# Patient Record
Sex: Female | Born: 1950 | Race: White | Hispanic: No | Smoking: Former smoker
Health system: Southern US, Community
[De-identification: ages and names within clinical notes are randomized; demographics above are authoritative.]

## PROBLEM LIST (undated history)

## (undated) DIAGNOSIS — K222 Esophageal obstruction: Secondary | ICD-10-CM

## (undated) DIAGNOSIS — F329 Major depressive disorder, single episode, unspecified: Secondary | ICD-10-CM

## (undated) DIAGNOSIS — K7689 Other specified diseases of liver: Secondary | ICD-10-CM

## (undated) DIAGNOSIS — K5730 Diverticulosis of large intestine without perforation or abscess without bleeding: Secondary | ICD-10-CM

## (undated) DIAGNOSIS — D126 Benign neoplasm of colon, unspecified: Secondary | ICD-10-CM

## (undated) DIAGNOSIS — M810 Age-related osteoporosis without current pathological fracture: Secondary | ICD-10-CM

## (undated) DIAGNOSIS — J329 Chronic sinusitis, unspecified: Secondary | ICD-10-CM

## (undated) DIAGNOSIS — F419 Anxiety disorder, unspecified: Secondary | ICD-10-CM

## (undated) DIAGNOSIS — I4891 Unspecified atrial fibrillation: Secondary | ICD-10-CM

## (undated) DIAGNOSIS — F32A Depression, unspecified: Secondary | ICD-10-CM

## (undated) DIAGNOSIS — T7840XA Allergy, unspecified, initial encounter: Secondary | ICD-10-CM

## (undated) DIAGNOSIS — E119 Type 2 diabetes mellitus without complications: Secondary | ICD-10-CM

## (undated) DIAGNOSIS — M1990 Unspecified osteoarthritis, unspecified site: Secondary | ICD-10-CM

## (undated) DIAGNOSIS — K219 Gastro-esophageal reflux disease without esophagitis: Secondary | ICD-10-CM

## (undated) DIAGNOSIS — M5030 Other cervical disc degeneration, unspecified cervical region: Secondary | ICD-10-CM

## (undated) DIAGNOSIS — C4491 Basal cell carcinoma of skin, unspecified: Secondary | ICD-10-CM

## (undated) HISTORY — DX: Other cervical disc degeneration, unspecified cervical region: M50.30

## (undated) HISTORY — DX: Benign neoplasm of colon, unspecified: D12.6

## (undated) HISTORY — DX: Esophageal obstruction: K22.2

## (undated) HISTORY — DX: Other specified diseases of liver: K76.89

## (undated) HISTORY — DX: Type 2 diabetes mellitus without complications: E11.9

## (undated) HISTORY — DX: Allergy, unspecified, initial encounter: T78.40XA

## (undated) HISTORY — DX: Chronic sinusitis, unspecified: J32.9

## (undated) HISTORY — PX: COLONOSCOPY: SHX174

## (undated) HISTORY — DX: Diverticulosis of large intestine without perforation or abscess without bleeding: K57.30

## (undated) HISTORY — DX: Basal cell carcinoma of skin, unspecified: C44.91

## (undated) HISTORY — DX: Anxiety disorder, unspecified: F41.9

## (undated) HISTORY — DX: Major depressive disorder, single episode, unspecified: F32.9

## (undated) HISTORY — PX: POLYPECTOMY: SHX149

## (undated) HISTORY — PX: TUBAL LIGATION: SHX77

## (undated) HISTORY — DX: Unspecified atrial fibrillation: I48.91

## (undated) HISTORY — PX: FRACTURE SURGERY: SHX138

## (undated) HISTORY — DX: Depression, unspecified: F32.A

## (undated) HISTORY — DX: Gastro-esophageal reflux disease without esophagitis: K21.9

## (undated) HISTORY — DX: Unspecified osteoarthritis, unspecified site: M19.90

## (undated) HISTORY — DX: Age-related osteoporosis without current pathological fracture: M81.0

---

## 1991-03-17 HISTORY — PX: ABDOMINAL HYSTERECTOMY: SHX81

## 1999-10-08 ENCOUNTER — Other Ambulatory Visit: Admission: RE | Admit: 1999-10-08 | Discharge: 1999-10-08 | Payer: Self-pay | Admitting: Obstetrics and Gynecology

## 2000-05-05 ENCOUNTER — Encounter: Payer: Self-pay | Admitting: Gastroenterology

## 2000-05-05 ENCOUNTER — Ambulatory Visit (HOSPITAL_COMMUNITY): Admission: RE | Admit: 2000-05-05 | Discharge: 2000-05-05 | Payer: Self-pay | Admitting: Internal Medicine

## 2000-05-05 DIAGNOSIS — K222 Esophageal obstruction: Secondary | ICD-10-CM

## 2000-10-28 ENCOUNTER — Other Ambulatory Visit: Admission: RE | Admit: 2000-10-28 | Discharge: 2000-10-28 | Payer: Self-pay | Admitting: Obstetrics and Gynecology

## 2001-03-14 DIAGNOSIS — D126 Benign neoplasm of colon, unspecified: Secondary | ICD-10-CM

## 2001-12-12 ENCOUNTER — Other Ambulatory Visit: Admission: RE | Admit: 2001-12-12 | Discharge: 2001-12-12 | Payer: Self-pay | Admitting: Obstetrics and Gynecology

## 2002-04-28 ENCOUNTER — Encounter: Payer: Self-pay | Admitting: Gastroenterology

## 2002-04-28 ENCOUNTER — Encounter: Admission: RE | Admit: 2002-04-28 | Discharge: 2002-04-28 | Payer: Self-pay | Admitting: Internal Medicine

## 2002-05-01 ENCOUNTER — Encounter: Payer: Self-pay | Admitting: Gastroenterology

## 2002-05-01 DIAGNOSIS — K5730 Diverticulosis of large intestine without perforation or abscess without bleeding: Secondary | ICD-10-CM | POA: Insufficient documentation

## 2003-09-07 ENCOUNTER — Other Ambulatory Visit: Admission: RE | Admit: 2003-09-07 | Discharge: 2003-09-07 | Payer: Self-pay | Admitting: Obstetrics and Gynecology

## 2004-10-14 ENCOUNTER — Ambulatory Visit: Payer: Self-pay | Admitting: Gastroenterology

## 2004-10-15 ENCOUNTER — Other Ambulatory Visit: Admission: RE | Admit: 2004-10-15 | Discharge: 2004-10-15 | Payer: Self-pay | Admitting: Addiction Medicine

## 2005-05-13 ENCOUNTER — Ambulatory Visit: Payer: Self-pay | Admitting: Gastroenterology

## 2005-10-16 ENCOUNTER — Other Ambulatory Visit: Admission: RE | Admit: 2005-10-16 | Discharge: 2005-10-16 | Payer: Self-pay | Admitting: Obstetrics and Gynecology

## 2005-11-24 ENCOUNTER — Ambulatory Visit: Payer: Self-pay | Admitting: Gastroenterology

## 2005-12-08 ENCOUNTER — Ambulatory Visit: Payer: Self-pay | Admitting: Gastroenterology

## 2006-11-18 ENCOUNTER — Other Ambulatory Visit: Admission: RE | Admit: 2006-11-18 | Discharge: 2006-11-18 | Payer: Self-pay | Admitting: Obstetrics and Gynecology

## 2006-12-17 ENCOUNTER — Ambulatory Visit: Payer: Self-pay | Admitting: Gastroenterology

## 2006-12-17 LAB — CONVERTED CEMR LAB
ALT: 42 units/L — ABNORMAL HIGH (ref 0–35)
AST: 42 units/L — ABNORMAL HIGH (ref 0–37)
Albumin: 3.8 g/dL (ref 3.5–5.2)
Alkaline Phosphatase: 67 units/L (ref 39–117)
Bilirubin, Direct: 0.1 mg/dL (ref 0.0–0.3)
Total Bilirubin: 0.7 mg/dL (ref 0.3–1.2)
Total Protein: 6.7 g/dL (ref 6.0–8.3)

## 2006-12-28 ENCOUNTER — Ambulatory Visit (HOSPITAL_COMMUNITY): Admission: RE | Admit: 2006-12-28 | Discharge: 2006-12-28 | Payer: Self-pay | Admitting: Gastroenterology

## 2007-01-24 ENCOUNTER — Ambulatory Visit: Payer: Self-pay | Admitting: Gastroenterology

## 2007-05-04 DIAGNOSIS — K219 Gastro-esophageal reflux disease without esophagitis: Secondary | ICD-10-CM | POA: Insufficient documentation

## 2007-05-04 DIAGNOSIS — K7689 Other specified diseases of liver: Secondary | ICD-10-CM | POA: Insufficient documentation

## 2007-05-04 DIAGNOSIS — M810 Age-related osteoporosis without current pathological fracture: Secondary | ICD-10-CM

## 2007-08-01 ENCOUNTER — Telehealth: Payer: Self-pay | Admitting: Gastroenterology

## 2007-09-14 HISTORY — PX: OTHER SURGICAL HISTORY: SHX169

## 2007-09-26 ENCOUNTER — Telehealth: Payer: Self-pay | Admitting: Gastroenterology

## 2007-09-29 ENCOUNTER — Ambulatory Visit: Payer: Self-pay | Admitting: Gastroenterology

## 2007-09-30 LAB — CONVERTED CEMR LAB
ALT: 49 units/L — ABNORMAL HIGH (ref 0–35)
AST: 41 units/L — ABNORMAL HIGH (ref 0–37)
Albumin: 4.1 g/dL (ref 3.5–5.2)
Alkaline Phosphatase: 72 units/L (ref 39–117)
Bilirubin, Direct: 0.1 mg/dL (ref 0.0–0.3)
Total Bilirubin: 0.7 mg/dL (ref 0.3–1.2)
Total Protein: 6.7 g/dL (ref 6.0–8.3)

## 2007-10-03 ENCOUNTER — Ambulatory Visit: Payer: Self-pay | Admitting: Gastroenterology

## 2007-11-22 ENCOUNTER — Ambulatory Visit: Payer: Self-pay | Admitting: Obstetrics and Gynecology

## 2007-11-22 ENCOUNTER — Other Ambulatory Visit: Admission: RE | Admit: 2007-11-22 | Discharge: 2007-11-22 | Payer: Self-pay | Admitting: Obstetrics and Gynecology

## 2007-11-22 ENCOUNTER — Encounter: Payer: Self-pay | Admitting: Obstetrics and Gynecology

## 2007-11-28 ENCOUNTER — Telehealth: Payer: Self-pay | Admitting: Gastroenterology

## 2007-12-05 ENCOUNTER — Ambulatory Visit: Payer: Self-pay | Admitting: Gastroenterology

## 2007-12-05 ENCOUNTER — Encounter: Payer: Self-pay | Admitting: Gastroenterology

## 2007-12-05 LAB — HM COLONOSCOPY

## 2007-12-06 ENCOUNTER — Encounter: Payer: Self-pay | Admitting: Gastroenterology

## 2007-12-13 ENCOUNTER — Ambulatory Visit: Payer: Self-pay | Admitting: Gastroenterology

## 2008-02-15 ENCOUNTER — Ambulatory Visit: Payer: Self-pay | Admitting: Obstetrics and Gynecology

## 2008-04-16 ENCOUNTER — Telehealth: Payer: Self-pay | Admitting: Gastroenterology

## 2008-09-05 ENCOUNTER — Telehealth: Payer: Self-pay | Admitting: Gastroenterology

## 2008-09-06 ENCOUNTER — Ambulatory Visit: Payer: Self-pay | Admitting: Gastroenterology

## 2008-09-06 ENCOUNTER — Encounter: Payer: Self-pay | Admitting: Nurse Practitioner

## 2008-09-06 DIAGNOSIS — R109 Unspecified abdominal pain: Secondary | ICD-10-CM

## 2008-09-06 DIAGNOSIS — K5900 Constipation, unspecified: Secondary | ICD-10-CM | POA: Insufficient documentation

## 2008-09-06 LAB — CONVERTED CEMR LAB: Tissue Transglutaminase Ab, IgA: 0.5 units (ref <7)

## 2008-09-07 DIAGNOSIS — R198 Other specified symptoms and signs involving the digestive system and abdomen: Secondary | ICD-10-CM

## 2008-09-07 LAB — CONVERTED CEMR LAB
ALT: 59 units/L — ABNORMAL HIGH (ref 0–35)
AST: 58 units/L — ABNORMAL HIGH (ref 0–37)
Albumin: 4.4 g/dL (ref 3.5–5.2)
Alkaline Phosphatase: 69 units/L (ref 39–117)
BUN: 12 mg/dL (ref 6–23)
Basophils Absolute: 0 10*3/uL (ref 0.0–0.1)
Basophils Relative: 0.3 % (ref 0.0–3.0)
CO2: 26 meq/L (ref 19–32)
Calcium: 8.8 mg/dL (ref 8.4–10.5)
Chloride: 106 meq/L (ref 96–112)
Creatinine, Ser: 0.6 mg/dL (ref 0.4–1.2)
Eosinophils Absolute: 0.1 10*3/uL (ref 0.0–0.7)
Eosinophils Relative: 2.1 % (ref 0.0–5.0)
GFR calc non Af Amer: 108.29 mL/min (ref >60)
Glucose, Bld: 98 mg/dL (ref 70–99)
HCT: 40.9 % (ref 36.0–46.0)
Hemoglobin: 14 g/dL (ref 12.0–15.0)
IgA: 270 mg/dL (ref 68–378)
Lymphocytes Relative: 37.4 % (ref 12.0–46.0)
Lymphs Abs: 2.6 10*3/uL (ref 0.7–4.0)
MCHC: 34.3 g/dL (ref 30.0–36.0)
MCV: 85.1 fL (ref 78.0–100.0)
Monocytes Absolute: 0.5 10*3/uL (ref 0.1–1.0)
Monocytes Relative: 6.7 % (ref 3.0–12.0)
Neutro Abs: 3.8 10*3/uL (ref 1.4–7.7)
Neutrophils Relative %: 53.5 % (ref 43.0–77.0)
Platelets: 243 10*3/uL (ref 150.0–400.0)
Potassium: 3.7 meq/L (ref 3.5–5.1)
RBC: 4.81 M/uL (ref 3.87–5.11)
RDW: 12.9 % (ref 11.5–14.6)
Sodium: 140 meq/L (ref 135–145)
TSH: 0.98 microintl units/mL (ref 0.35–5.50)
Total Bilirubin: 0.9 mg/dL (ref 0.3–1.2)
Total Protein: 7.5 g/dL (ref 6.0–8.3)
WBC: 7 10*3/uL (ref 4.5–10.5)

## 2008-12-19 ENCOUNTER — Other Ambulatory Visit: Admission: RE | Admit: 2008-12-19 | Discharge: 2008-12-19 | Payer: Self-pay | Admitting: Obstetrics and Gynecology

## 2008-12-19 ENCOUNTER — Ambulatory Visit: Payer: Self-pay | Admitting: Obstetrics and Gynecology

## 2008-12-19 ENCOUNTER — Encounter: Payer: Self-pay | Admitting: Obstetrics and Gynecology

## 2009-04-02 ENCOUNTER — Ambulatory Visit: Payer: Self-pay | Admitting: Gastroenterology

## 2009-04-02 ENCOUNTER — Encounter (INDEPENDENT_AMBULATORY_CARE_PROVIDER_SITE_OTHER): Payer: Self-pay | Admitting: *Deleted

## 2009-04-02 DIAGNOSIS — R1013 Epigastric pain: Secondary | ICD-10-CM | POA: Insufficient documentation

## 2009-04-02 LAB — CONVERTED CEMR LAB
ALT: 46 units/L — ABNORMAL HIGH (ref 0–35)
AST: 42 units/L — ABNORMAL HIGH (ref 0–37)
Albumin: 4.3 g/dL (ref 3.5–5.2)
Alkaline Phosphatase: 67 units/L (ref 39–117)
BUN: 12 mg/dL (ref 6–23)
Basophils Absolute: 0 10*3/uL (ref 0.0–0.1)
Basophils Relative: 0.4 % (ref 0.0–3.0)
Bilirubin, Direct: 0.1 mg/dL (ref 0.0–0.3)
CO2: 23 meq/L (ref 19–32)
Calcium: 9.3 mg/dL (ref 8.4–10.5)
Chloride: 110 meq/L (ref 96–112)
Creatinine, Ser: 0.7 mg/dL (ref 0.4–1.2)
Eosinophils Absolute: 0.1 10*3/uL (ref 0.0–0.7)
Eosinophils Relative: 2.1 % (ref 0.0–5.0)
Ferritin: 27.3 ng/mL (ref 10.0–291.0)
GFR calc non Af Amer: 90.47 mL/min (ref >60)
Glucose, Bld: 108 mg/dL — ABNORMAL HIGH (ref 70–99)
HCT: 39.9 % (ref 36.0–46.0)
Hemoglobin: 13.4 g/dL (ref 12.0–15.0)
Iron: 70 ug/dL (ref 42–145)
Lymphocytes Relative: 41.8 % (ref 12.0–46.0)
Lymphs Abs: 2.8 10*3/uL (ref 0.7–4.0)
MCHC: 33.6 g/dL (ref 30.0–36.0)
MCV: 86.3 fL (ref 78.0–100.0)
Monocytes Absolute: 0.4 10*3/uL (ref 0.1–1.0)
Monocytes Relative: 6.5 % (ref 3.0–12.0)
Neutro Abs: 3.3 10*3/uL (ref 1.4–7.7)
Neutrophils Relative %: 49.2 % (ref 43.0–77.0)
Platelets: 241 10*3/uL (ref 150.0–400.0)
Potassium: 4 meq/L (ref 3.5–5.1)
RBC: 4.62 M/uL (ref 3.87–5.11)
RDW: 13.2 % (ref 11.5–14.6)
Saturation Ratios: 20.2 % (ref 20.0–50.0)
Sed Rate: 9 mm/hr (ref 0–22)
Sodium: 140 meq/L (ref 135–145)
TSH: 1.31 microintl units/mL (ref 0.35–5.50)
Total Bilirubin: 0.6 mg/dL (ref 0.3–1.2)
Total Protein: 6.8 g/dL (ref 6.0–8.3)
Transferrin: 247.9 mg/dL (ref 212.0–360.0)
WBC: 6.6 10*3/uL (ref 4.5–10.5)

## 2009-04-08 ENCOUNTER — Ambulatory Visit: Payer: Self-pay | Admitting: Gastroenterology

## 2009-04-08 LAB — CONVERTED CEMR LAB: UREASE: NEGATIVE

## 2009-04-10 ENCOUNTER — Encounter: Payer: Self-pay | Admitting: Gastroenterology

## 2009-04-11 ENCOUNTER — Ambulatory Visit (HOSPITAL_COMMUNITY): Admission: RE | Admit: 2009-04-11 | Discharge: 2009-04-11 | Payer: Self-pay | Admitting: Gastroenterology

## 2009-04-29 ENCOUNTER — Telehealth: Payer: Self-pay | Admitting: Gastroenterology

## 2009-06-06 ENCOUNTER — Ambulatory Visit: Payer: Self-pay | Admitting: Obstetrics and Gynecology

## 2009-08-08 ENCOUNTER — Telehealth: Payer: Self-pay | Admitting: Gastroenterology

## 2009-08-13 ENCOUNTER — Encounter: Payer: Self-pay | Admitting: Gastroenterology

## 2009-09-13 ENCOUNTER — Ambulatory Visit: Payer: Self-pay | Admitting: Women's Health

## 2009-10-02 ENCOUNTER — Ambulatory Visit: Payer: Self-pay | Admitting: Women's Health

## 2009-10-14 ENCOUNTER — Ambulatory Visit: Payer: Self-pay | Admitting: Women's Health

## 2009-10-28 ENCOUNTER — Ambulatory Visit: Payer: Self-pay | Admitting: Gynecology

## 2009-12-23 ENCOUNTER — Other Ambulatory Visit: Admission: RE | Admit: 2009-12-23 | Discharge: 2009-12-23 | Payer: Self-pay | Admitting: Obstetrics and Gynecology

## 2009-12-23 ENCOUNTER — Ambulatory Visit: Payer: Self-pay | Admitting: Obstetrics and Gynecology

## 2010-04-15 NOTE — Assessment & Plan Note (Signed)
Summary: FOLLOW UP--CH.    History of Present Illness Visit Type: Follow-up Visit Primary GI MD: Verl Blalock MD [place] Primary Provider: Waverly Ferrari, MD Requesting Provider: n/a Chief Complaint: diarrhea, followed by epigastric pain for 1 week  GI Review of Systems    Reports abdominal pain and  loss of appetite.     Location of  Abdominal pain: epigastric area.    Denies acid reflux, belching, bloating, chest pain, dysphagia with liquids, dysphagia with solids, heartburn, nausea, vomiting, vomiting blood, weight loss, and  weight gain.      Reports diarrhea.     Denies anal fissure, black tarry stools, change in bowel habit, constipation, diverticulosis, fecal incontinence, heme positive stool, hemorrhoids, irritable bowel syndrome, jaundice, light color stool, liver problems, rectal bleeding, and  rectal pain.    Current Medications (verified): 1)  Omeprazole 20 Mg  Cpdr (Omeprazole) .Marland Kitchen.. 1 Each Day 30 Minutes Before Meal 2)  Multivitamins  Tabs (Multiple Vitamin) .... Take 1 Tablet By Mouth Once A Day 3)  Fish Oil 300 Mg Caps (Omega-3 Fatty Acids) .... One By Mouth Once Daily 4)  Actonel 5 Mg Tabs (Risedronate Sodium) .Marland Kitchen.. 1 Tablet By Mouth Every Month 5)  Lumigan 0.03 % Soln (Bimatoprost) .Marland Kitchen.. 1 Gtt Left Eye Every Day 6)  Cosopt 2-0.5 % Soln (Dorzolamide-Timolol) .... Once Daily  Allergies (verified): No Known Drug Allergies  Past History:  Past Medical History: Reviewed history from 05/04/2007 and no changes required. Current Problems:  ESOPHAGEAL STRICTURE (ICD-530.3) COLONIC POLYPS (ICD-211.3) DIVERTICULOSIS, COLON (ICD-562.10) FATTY LIVER DISEASE (ICD-571.8) GERD (ICD-530.81) OSTEOPOROSIS (ICD-733.00)  Past Surgical History: Reviewed history from 12/13/2007 and no changes required. BTL 1985 Left arm surgery 7/09  Family History: Reviewed history from 09/06/2008 and no changes required. Family History of Uterine Cancer:maternal  grandmother Father-Colon Caner Family History of Diabetes: paternal grandfather  Social History: Reviewed history from 09/06/2008 and no changes required. Occupation: house wife Patient has never smoked.  Alcohol Use - yes occ Daily Caffeine Use  1 cup Illicit Drug Use - no Patient does not get regular exercise.   Vital Signs:  Patient profile:   60 year old female Height:      66 inches Weight:      204.13 pounds BMI:     33.07 Pulse rate:   68 / minute Pulse rhythm:   regular BP sitting:   152 / 90  (left arm) Cuff size:   regular  Vitals Entered By: June McMurray [address] Deborra Medina) (April 02, 2009 3:06 PM)   Other Orders: TLB-CBC Platelet - w/Differential (85025-CBCD) TLB-BMP (Basic Metabolic Panel-BMET) (73710-GYIRSWN) TLB-Hepatic/Liver Function Pnl (80076-HEPATIC) TLB-TSH (Thyroid Stimulating Hormone) (84443-TSH) TLB-Ferritin (82728-FER) TLB-IBC Pnl (Iron/FE;Transferrin) (83550-IBC) TLB-Sedimentation Rate (ESR) (85652-ESR) T-Beta Carotene (46270-35009) EGD (EGD) Ultrasound Abdomen (UAS) Prescriptions: LEVSIN/SL 0.125 MG  SUBL (HYOSCYAMINE SULFATE) 1 sl q 4-6 hrs prn  #30 x 3   Entered by:   Alberteen Spindle RN   Authorized by:   Sable Feil MD [facility]   Signed by:   Alberteen Spindle RN on 04/02/2009   Method used:   Electronically to        [facility].* (retail)       [address]       [place], [place]  [address]       Ph: [phone]       Fax: [phone]   RxID:   [id number]   Appended Document: FOLLOW  UP--CH. Lorenia has continued epigastric pain that will last one week in duration and is associated with some diarrhea. She denies abuse of NSAIDs or alcohol. Last endoscopy was 10 years ago . She does have fatty liver and has had ultrasounds previously. She denies any hepatobiliary complaints this time. General exam and abdominal exam are unremarkable. I scheduled her for repeat endoscopy, ultrasound exam, and  labs and will continue her current meds include daily omeprazole and use p.r.n. Levsin. 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

## 2010-04-15 NOTE — Procedures (Signed)
Summary: Upper Endoscopy  Patient: [name] Note: All result statuses are Final unless otherwise noted.  Tests: (1) Upper Endoscopy (EGD)   EGD Upper Endoscopy       [facility]     [place], [place]  [id number]           ENDOSCOPY PROCEDURE REPORT           PATIENT:  Diana Reeves, Diana Reeves  MR#:  [id number]     BIRTHDATE:  1950-10-05, 58 yrs. old  GENDER:  female           ENDOSCOPIST:  Loralee Pacas. Sharlett Iles, MD, [facility]     Referred by:           PROCEDURE DATE:  04/08/2009     PROCEDURE:  EGD with biopsy, Venia Minks Dilation of Esophagus     ASA CLASS:  Class II     INDICATIONS:  dysphagia, early satiety, epigastric pain           MEDICATIONS:   Fentanyl 50 mcg IV, Versed 7 mg IV, glycopyrrolate     (Robinal) 0.2 mg IV     TOPICAL ANESTHETIC:  Exactacain Spray           DESCRIPTION OF PROCEDURE:   After the risks benefits and     alternatives of the procedure were thoroughly explained, informed     consent was obtained.  The Pentax EG-[id number] endoscope was     introduced through the mouth and advanced to the second portion of     the duodenum, without limitations.  The instrument was slowly     withdrawn as the mucosa was fully examined.     <<PROCEDUREIMAGES>>           A stricture was found in the distal esophagus. Dilation with     maloney dilator 17MM DILATOR PASSED WITH SOME "GIVE".NO PAIN OR     HEME.  A hiatal hernia was found. 3 CM. HH NOTED.ESOPHAGEAL     BIOPSIES DONE,,  There were multiple polyps identified. BENIGN     HYPERPLASTIC FUNDAL POLYPS NOTED.  The duodenal bulb was normal in     appearance, as was the postbulbar duodenum. SMALL BOWEL BIOPSY AND     CLO BX. DONE.    Retroflexed views revealed a hiatal hernia.     The scope was then withdrawn from the patient and the procedure     completed.           COMPLICATIONS:  None           ENDOSCOPIC IMPRESSION:     1) Stricture in the distal esophagus     2) Hiatal hernia     3)  Polyps, multiple     4) Normal duodenum     1. CHRONIC GERD AND STRICTURE DILATED.R/O EOSINOPHILIC     ESOPHAGITIIS     2.R/O CELIAC DISEASE     3. BENIGN GASTRIC POLYPS FROM PPI USE     RECOMMENDATIONS:     1) await biopsy results     2) anti-reflux regimen to be follow     3) continue PPI     4) post dilation instructions     CONSIDER REPEAT ULTRASOUND EXAM           REPEAT EXAM:  No           ______________________________     Loralee Pacas. Sharlett Iles, MD, [facility]  CC:  Vanetta Mulders, MD           n.     Lorrin MaisLoralee Pacas. Patterson at 04/08/2009 04:47 PM           Haig Prophet, [id number]  Note: An exclamation mark (!) indicates a result that was not dispersed into the flowsheet. Document Creation Date: 04/08/2009 4:47 PM _______________________________________________________________________  (1) Order result status: Final Collection or observation date-time: 04/08/2009 16:35 Requested date-time:  Receipt date-time:  Reported date-time:  Referring Physician:   Ordering Physician: Verl Blalock [phone]) Specimen Source:  Source: Tawanna Cooler Order Number: [id number] Lab site:

## 2010-04-15 NOTE — Letter (Signed)
Summary: Patient [name] Biopsy Results  [facility]  [place], [place] [address]   Phone: [phone]  Fax: [phone]        April 10, 2009 MRN: [id number]    Diana Reeves [address] [place], [place]  [address]    Dear Ms. Venneman,  I am pleased to inform you that the biopsies taken during your recent endoscopic examination did not show any evidence of cancer upon pathologic examination.  Additional information/recommendations:  __No further action is needed at this time.  Please follow-up with      your primary care physician for your other healthcare needs.  __ Please call [phone] to schedule a return visit to review      your condition.  XX__ Continue with the treatment plan as outlined on the day of your      exam.  __ You should have a repeat endoscopic examination for this problem              in _ months/years.   Please call us if you are having persistent problems or have questions about your condition that have not been fully answered at this time.  Sincerely,  Sable Feil MD [facility]  This letter has been electronically signed by your physician.  Appended Document: Patient Notice-Endo Biopsy Results Letter mailed 1.28.11

## 2010-04-15 NOTE — Miscellaneous (Signed)
Summary: Nexium Prior  Authorization   Clinical Lists Changes [id number] Member Number: [id number] Case Type: Initial Review Case Start Date: 08/13/2009 Case Status: Coverage has been APPROVED. You will receive a confirmation letter confirming approval of this medication. The patient will also be notified of this approval via an automated outbound phone call or a letter. Please allow approximately 2 hours to update our system with the approval. Once updated, the prescription can be re-submitted.   Coverage Start Date: 07/23/2009 Coverage End Date: 08/13/2010  Patient First Name: Diana Patient Last Name: Reeves DOB: 11-23-1950 Patient Street Address: [place]: [place] Patient State: [place] Patient Zip: [phone]  Drug Name & Strength: Nexium 40 Mg

## 2010-04-15 NOTE — Medication Information (Signed)
Summary: Nexium Approved/[employer]  Nexium Approved/[employer]   Imported By: Phillis Knack 08/19/2009 14:27:52  _____________________________________________________________________  External Attachment:    Type:   Image     Comment:   External Document

## 2010-04-15 NOTE — Miscellaneous (Signed)
Summary: clotest  Clinical Lists Changes  Orders: Added new Test order of TLB-H Pylori Screen Gastric Biopsy (83013-CLOTEST) - Signed

## 2010-04-15 NOTE — Letter (Signed)
Summary: EGD Instructions  [facility]  [place], [place] [address]   Phone: [phone]  Fax: [phone]       Diana Reeves    08-28-1950    MRN: [id number]       Procedure Day /Date: Monday, 04/08/09     Arrival Time: 3:00     Procedure Time: 4:00     Location of Procedure:                    [facility] [place] (4th Floor)    PREPARATION FOR ENDOSCOPY   On 04/08/09 THE DAY OF THE PROCEDURE:  1.   No solid foods, milk or milk products are allowed after midnight the night before your procedure.  2.   Do not drink anything colored red or purple.  Avoid juices with pulp.  No orange juice.  3.  You may drink clear liquids until 2:00, which is 2 hours before your procedure.                                                                                                CLEAR LIQUIDS INCLUDE: Water Jello Ice Popsicles Tea (sugar ok, no milk/cream) Powdered fruit flavored drinks Coffee (sugar ok, no milk/cream) Gatorade Juice: apple, white grape, white cranberry  Lemonade Clear bullion, consomm, broth Carbonated beverages (any kind) Strained chicken noodle soup Hard Candy   MEDICATION INSTRUCTIONS  Unless otherwise instructed, you should take regular prescription medications with a small sip of water as early as possible the morning of your procedure.                     OTHER INSTRUCTIONS  You will need a responsible adult at least 62 years of age to accompany you and drive you home.   This person must remain in the waiting room during your procedure.  Wear loose fitting clothing that is easily removed.  Leave jewelry and other valuables at home.  However, you may wish to bring a book to read or an iPod/MP3 player to listen to music as you wait for your procedure to start.  Remove all body piercing jewelry and leave at home.  Total time from sign-in until discharge is approximately 2-3 hours.  You should go  home directly after your procedure and rest.  You can resume normal activities the day after your procedure.  The day of your procedure you should not:   Drive   Make legal decisions   Operate machinery   Drink alcohol   Return to work  You will receive specific instructions about eating, activities and medications before you leave.    The above instructions have been reviewed and explained to me by   _______________________    I fully understand and can verbalize these instructions _____________________________ Date _________

## 2010-04-15 NOTE — Progress Notes (Signed)
Summary: Nexium refill   Phone Note Call from Patient Call back at [phone]   Call For: Dr Sharlett Iles Reason for Call: Talk to Nurse Summary of Call: Kristopher Oppenheim on [address] told her they noticfied [place] of her Nexium refill last monday and we have not responded.  Initial call taken by: Irwin Brakeman [facility],  Aug 08, 2009 4:51 PM  Follow-up for Phone Call        Lm for pt that Rx has been sent to pharmacy Follow-up by: Alberteen Spindle RN,  Aug 09, 2009 9:50 AM    Prescriptions: NEXIUM 40 MG CPDR (ESOMEPRAZOLE MAGNESIUM) 1 by mouth qd  #30 x 11   Entered by:   Alberteen Spindle RN   Authorized by:   Sable Feil MD [facility]   Signed by:   Alberteen Spindle RN on 08/09/2009   Method used:   Electronically to        [facility].* (retail)       [address]       [place], [place]  [address]       Ph: [phone]       Fax: [phone]   RxID:   [id number]

## 2010-04-15 NOTE — Progress Notes (Signed)
Summary: results wanted  Medications Added NEXIUM 40 MG CPDR (ESOMEPRAZOLE MAGNESIUM) 1 by mouth qd       Phone Note Call from Patient Call back at Home Phone [phone] Call back at [phone]  cell   Caller: Patient Call For: Dr. Sharlett Iles Reason for Call: Talk to Nurse Summary of Call: 1. would like [place] results 2. would like to know if she is supposed to continue taking Dexilant Initial call taken by: Lucien Mons,  April 29, 2009 4:27 PM  Follow-up for Phone Call        Pt informed of [place] results.  Pt states that if she needs to be on long term PPI she would like to take Nexium.  This has worked best for her, she just had problem getting insurance to cover this.  Would like Rx called in to see if insurance will cover it now.  Rx sent. Follow-up by: Alberteen Spindle RN,  April 29, 2009 4:53 PM    New/Updated Medications: NEXIUM 40 MG CPDR (ESOMEPRAZOLE MAGNESIUM) 1 by mouth qd Prescriptions: NEXIUM 40 MG CPDR (ESOMEPRAZOLE MAGNESIUM) 1 by mouth qd  #30 x 11   Entered by:   Alberteen Spindle RN   Authorized by:   Sable Feil MD [facility]   Signed by:   Alberteen Spindle RN on 04/29/2009   Method used:   Electronically to        [facility].* (retail)       [address]       [place], [place]  [address]       Ph: [phone]       Fax: [phone]   RxID:   [id number]

## 2010-05-13 ENCOUNTER — Institutional Professional Consult (permissible substitution): Payer: Self-pay | Admitting: Obstetrics and Gynecology

## 2010-06-18 ENCOUNTER — Ambulatory Visit (INDEPENDENT_AMBULATORY_CARE_PROVIDER_SITE_OTHER): Payer: BC Managed Care – PPO | Admitting: Obstetrics and Gynecology

## 2010-06-18 DIAGNOSIS — N816 Rectocele: Secondary | ICD-10-CM

## 2010-06-18 DIAGNOSIS — N390 Urinary tract infection, site not specified: Secondary | ICD-10-CM

## 2010-06-18 DIAGNOSIS — B373 Candidiasis of vulva and vagina: Secondary | ICD-10-CM

## 2010-06-18 DIAGNOSIS — R350 Frequency of micturition: Secondary | ICD-10-CM

## 2010-06-18 DIAGNOSIS — N898 Other specified noninflammatory disorders of vagina: Secondary | ICD-10-CM

## 2010-07-01 ENCOUNTER — Ambulatory Visit (INDEPENDENT_AMBULATORY_CARE_PROVIDER_SITE_OTHER): Payer: BC Managed Care – PPO | Admitting: Obstetrics and Gynecology

## 2010-07-01 DIAGNOSIS — R350 Frequency of micturition: Secondary | ICD-10-CM

## 2010-07-01 DIAGNOSIS — N952 Postmenopausal atrophic vaginitis: Secondary | ICD-10-CM

## 2010-07-01 DIAGNOSIS — Z833 Family history of diabetes mellitus: Secondary | ICD-10-CM

## 2010-07-01 DIAGNOSIS — N898 Other specified noninflammatory disorders of vagina: Secondary | ICD-10-CM

## 2010-07-01 DIAGNOSIS — B373 Candidiasis of vulva and vagina: Secondary | ICD-10-CM

## 2010-07-29 NOTE — Assessment & Plan Note (Signed)
[facility] OFFICE NOTE   Diana Reeves, Diana Reeves                        MRN:          [id number]  DATE:01/24/2007                            DOB:          [birth year]    Diana Reeves comes in for annual evaluation for her known fatty liver  difficulties.  She is asymptomatic in terms of any abdominal pain or  general medical symptomatology.  Liver profile on December 17, 2006,  showed an SGOT of 42, SGPT of 42 with a serum albumin of 3.8 grams%,  normal alkaline phosphatase and bilirubin.  Ultrasound on December 28, 2006, showed an echodense liver consistent with fatty infiltration, but  otherwise was unremarkable.   MEDICATIONS:  1. Nexium 40 mg a day for acid reflux.  2. Calcium daily.  3. Large doses of vitamin D in the terms of 50,000 units a day.  4. Omega Fish Oil daily.  5. Actonel weekly.  6. Multivitamin.   She received her primary care and gynecologic care through Dr.  Cherylann Banas.  She relates that recent lab data was otherwise unremarkable  including thyroid function tests.   PHYSICAL EXAMINATION:  GENERAL:  She is awake and alert and in no  distress.  VITAL SIGNS:  She weighs 213 pounds which is up some 15 pounds from a  year ago.  Blood pressure 140/86, pulse 80 and regular.  ABDOMEN:  I could not appreciate stigmata of chronic liver disease.  Her  liver edge was palpable, it was not nodular and nontender.  There was no  splenomegaly, other abdominal masses, tenderness, or ascites.  NEUROLOGY:  Mental status was clear.   ASSESSMENT:  The patient has NASH syndrome and I am concerned that she  may go on to cirrhosis if she does not get a better handle on her  difficulties.  She seems to be little motivated in terms of weight loss.  I have suggested to her a liver biopsy which she has declined.   RECOMMENDATIONS:  1. I have asked her to go on Weight Watcher's diet and to lose at      least 15 pounds by  the time she sees me again in four months.  2. Repeat liver profile before office visit.  3. Continue other medications per Dr. Cherylann Banas.  4. Further decisions about liver biopsy and Actos therapy depending on      her clinical course.  5. 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     Loralee Pacas. Sharlett Iles, MD, [facility], [place]  Electronically Signed    DRP/MedQ  DD: 01/24/2007  DT: 01/25/2007  Job #: [id number]   cc:   Diana Reeves. Cherylann Banas, M.D.

## 2010-08-01 NOTE — Assessment & Plan Note (Signed)
[facility] OFFICE NOTE   Diana Reeves, Diana Reeves                        MRN:          [id number]  DATE:11/24/2005                            DOB:          [birth year]    [name] has no complaints whatsoever and is doing well on daily Nexium.  She  has osteoporosis.  Is on calcium and Actonel.  I have sent her by the lab to  check a celiac panel and have asked her to continue her calcium  supplementation with vitamin D.  Due for follow-up colonoscopy in two years  time.  I have renewed her Nexium, reviewed her reflux regime with her.  I  will see her on a p.r.n. basis in the future as needed.                                   Loralee Pacas. Diana Iles, MD, [facility], [place]   DRP/MedQ  DD:  11/24/2005  DT:  11/25/2005  Job #:  [id number]   cc:   Quillian Quince L. Cherylann Banas, M.D.

## 2010-08-29 ENCOUNTER — Encounter: Payer: Self-pay | Admitting: Internal Medicine

## 2010-09-01 ENCOUNTER — Other Ambulatory Visit (INDEPENDENT_AMBULATORY_CARE_PROVIDER_SITE_OTHER): Payer: BC Managed Care – PPO

## 2010-09-01 ENCOUNTER — Ambulatory Visit (INDEPENDENT_AMBULATORY_CARE_PROVIDER_SITE_OTHER): Payer: BC Managed Care – PPO | Admitting: Internal Medicine

## 2010-09-01 ENCOUNTER — Encounter: Payer: Self-pay | Admitting: Internal Medicine

## 2010-09-01 VITALS — BP 148/82 | HR 66 | Temp 97.6°F | Resp 16 | Ht 66.0 in | Wt 187.5 lb

## 2010-09-01 DIAGNOSIS — R197 Diarrhea, unspecified: Secondary | ICD-10-CM | POA: Insufficient documentation

## 2010-09-01 DIAGNOSIS — M839 Adult osteomalacia, unspecified: Secondary | ICD-10-CM

## 2010-09-01 DIAGNOSIS — I10 Essential (primary) hypertension: Secondary | ICD-10-CM | POA: Insufficient documentation

## 2010-09-01 DIAGNOSIS — K7689 Other specified diseases of liver: Secondary | ICD-10-CM

## 2010-09-01 DIAGNOSIS — F418 Other specified anxiety disorders: Secondary | ICD-10-CM

## 2010-09-01 DIAGNOSIS — K219 Gastro-esophageal reflux disease without esophagitis: Secondary | ICD-10-CM

## 2010-09-01 DIAGNOSIS — F341 Dysthymic disorder: Secondary | ICD-10-CM

## 2010-09-01 LAB — CBC WITH DIFFERENTIAL/PLATELET
Basophils Relative: 0.3 % (ref 0.0–3.0)
Eosinophils Absolute: 0.1 10*3/uL (ref 0.0–0.7)
MCHC: 34.4 g/dL (ref 30.0–36.0)
MCV: 86.1 fl (ref 78.0–100.0)
Monocytes Absolute: 0.5 10*3/uL (ref 0.1–1.0)
Neutrophils Relative %: 69.6 % (ref 43.0–77.0)
Platelets: 247 10*3/uL (ref 150.0–400.0)
RBC: 5.01 Mil/uL (ref 3.87–5.11)
RDW: 14.6 % (ref 11.5–14.6)

## 2010-09-01 LAB — COMPREHENSIVE METABOLIC PANEL
Alkaline Phosphatase: 62 U/L (ref 39–117)
Glucose, Bld: 112 mg/dL — ABNORMAL HIGH (ref 70–99)
Sodium: 141 mEq/L (ref 135–145)
Total Bilirubin: 0.6 mg/dL (ref 0.3–1.2)
Total Protein: 7.3 g/dL (ref 6.0–8.3)

## 2010-09-01 MED ORDER — ERGOCALCIFEROL 1.25 MG (50000 UT) PO CAPS: 50000.0000 [IU] | ORAL_CAPSULE | ORAL | Status: DC

## 2010-09-01 MED ORDER — DESVENLAFAXINE SUCCINATE ER 50 MG PO TB24: 50.0000 mg | ORAL_TABLET | Freq: Every day | ORAL | Status: DC

## 2010-09-01 MED ORDER — ESOMEPRAZOLE MAGNESIUM 40 MG PO CPDR: 40.0000 mg | DELAYED_RELEASE_CAPSULE | Freq: Every day | ORAL | Status: DC

## 2010-09-01 MED ORDER — CLONAZEPAM 0.5 MG PO TABS: 0.5000 mg | ORAL_TABLET | Freq: Three times a day (TID) | ORAL | Status: DC | PRN

## 2010-09-01 NOTE — Progress Notes (Signed)
Subjective:    Patient ID: Diana Reeves, female    DOB: 18-Feb-1951, 60 y.o.   MRN: [id number]  HPI New pt to me who has has several severe stressors over the last year and is now feeling anxious, shaky, sad, nervous, and anhedonic.  Also, she has chronic/intermittent diarrhea and was told that she does not absorb vitamin D and has fatty liver disease - ?? Celiac disease.   Review of Systems  Constitutional: Positive for fatigue and unexpected weight change (weight gain). Negative for fever, chills, diaphoresis, activity change and appetite change.  HENT: Positive for congestion, rhinorrhea and postnasal drip. Negative for hearing loss, ear pain, nosebleeds, sore throat, facial swelling, sneezing, drooling, mouth sores, trouble swallowing, neck pain, neck stiffness, dental problem, voice change, sinus pressure, tinnitus and ear discharge.   Eyes: Negative for photophobia and visual disturbance.  Respiratory: Negative for apnea, cough, choking, chest tightness, shortness of breath, wheezing and stridor.   Cardiovascular: Negative for chest pain, palpitations and leg swelling.  Gastrointestinal: Positive for diarrhea. Negative for vomiting, abdominal pain, constipation and blood in stool.  Genitourinary: Negative for dysuria, urgency, frequency, hematuria, flank pain, decreased urine volume, enuresis, difficulty urinating, genital sores and dyspareunia.  Musculoskeletal: Negative for myalgias, back pain, joint swelling, arthralgias and gait problem.  Skin: Negative for color change, pallor and rash.  Neurological: Negative for dizziness, tremors, seizures, syncope, facial asymmetry, speech difficulty, weakness, light-headedness, numbness and headaches.  Hematological: Negative for adenopathy. Does not bruise/bleed easily.  Psychiatric/Behavioral: Positive for suicidal ideas, sleep disturbance, dysphoric mood and decreased concentration. Negative for hallucinations, behavioral problems, confusion,  self-injury and agitation. The patient is nervous/anxious. The patient is not hyperactive.        Objective:   Physical Exam  Vitals reviewed. Constitutional: She is oriented to person, place, and time. She appears well-developed and well-nourished. No distress.  HENT:  Head: Normocephalic and atraumatic.  Right Ear: External ear normal.  Left Ear: External ear normal.  Nose: Nose normal.  Mouth/Throat: Oropharynx is clear and moist. No oropharyngeal exudate.  Eyes: Conjunctivae and EOM are normal. Pupils are equal, round, and reactive to light. Right eye exhibits no discharge. Left eye exhibits no discharge. No scleral icterus.  Neck: Normal range of motion. Neck supple. No JVD present. No tracheal deviation present. No thyromegaly present.  Cardiovascular: Normal rate, regular rhythm, normal heart sounds and intact distal pulses.  Exam reveals no gallop and no friction rub.   No murmur heard. Pulmonary/Chest: Effort normal and breath sounds normal. No stridor. No respiratory distress. She has no wheezes. She has no rales. She exhibits no tenderness.  Abdominal: Soft. Bowel sounds are normal. She exhibits no distension and no mass. There is no tenderness. There is no rebound and no guarding.  Musculoskeletal: Normal range of motion. She exhibits no edema and no tenderness.  Lymphadenopathy:    She has no cervical adenopathy.  Neurological: She is alert and oriented to person, place, and time. She has normal reflexes. She displays normal reflexes. No cranial nerve deficit. She exhibits normal muscle tone. Coordination normal.  Skin: Skin is warm and dry. No rash noted. She is not diaphoretic. No erythema. No pallor.  Psychiatric: Judgment and thought content normal. Her mood appears anxious. Her affect is not angry, not blunt, not labile and not inappropriate. Her speech is not rapid and/or pressured, not delayed, not tangential and not slurred. She is slowed. She is not agitated, not  aggressive, is not hyperactive, not withdrawn, not actively  hallucinating and not combative. Thought content is not paranoid and not delusional. Cognition and memory are not impaired. She does not express impulsivity or inappropriate judgment. She exhibits a depressed mood (sad and tearful). She expresses no homicidal and no suicidal ideation. She expresses no suicidal plans and no homicidal plans. She is communicative. She exhibits normal recent memory and normal remote memory. She is attentive.          Assessment & Plan:

## 2010-09-01 NOTE — Assessment & Plan Note (Signed)
Check her vitamin d level and check for celiac disease

## 2010-09-01 NOTE — Assessment & Plan Note (Signed)
Check labs today.

## 2010-09-01 NOTE — Assessment & Plan Note (Signed)
Continue nexium

## 2010-09-01 NOTE — Assessment & Plan Note (Signed)
Start Pristiq and Klonopin and continue psychotherapy at [facility]

## 2010-09-01 NOTE — Patient Instructions (Signed)
Depression, Adolescent and Adult Depression is a true and treatable medical condition. In general there are two kinds of depression:  Depression we all experience in some form. For example depression from the death of a loved one, financial distress or natural disasters will trigger or increase depression.   Clinical depression, on the other hand, appears without an apparent cause or reason. This depression is a disease. Depression may be caused by chemical imbalance in the body and brain or may come as a response to a physical illness. Alcohol and other drugs can cause depression.  DIAGNOSIS  The diagnosis of depression is usually based upon symptoms and medical history. TREATMENT Treatments for depression fall into three categories. These are:  Drug therapy. There are many medicines that treat depression. Responses may vary and sometimes trial and error is necessary to determine the best medicines and dosage for a particular patient.   Psychotherapy, also called talking treatments, helps people resolve their problems by looking at them from a different point of view and by giving people insight into their own personal makeup. Traditional psychotherapy looks at a childhood source of a problem. Other psychotherapy will look at current conflicts and move toward solving those. If the cause of depression is drug use, counseling is available to help abstain. In time the depression will usually improve. If there were underlying causes for the chemical use, they can be addressed.   ECT (electroconvulsive therapy) or shock treatment is not as commonly used today. It is a very effective treatment for severe suicidal depression. During ECT electrical impulses are applied to the head. These impulses cause a generalized seizure. It can be effective but causes a loss of memory for recent events. Sometimes this loss of memory may include the last several months.  Treat all depression or suicide threats as  serious. Obtain professional help. Do not wait to see if serious depression will get better over time without help. Seek help for yourself or those around you. In the [place] the number to the [employer] [facility] 24 Hour Help Are: 1-800-SUICIDE 240-442-6249 Document Released: 02/28/2000 Document Re-Released: 05/29/2008 [facility] Patient Information 2011 [place].

## 2010-09-01 NOTE — Assessment & Plan Note (Signed)
She has several issues that raise concern that she may have celiac disease so an antibody panel was sent today

## 2010-09-02 LAB — GLIADIN ANTIBODIES, SERUM: Gliadin IgG: 2.6 U/mL (ref <20)

## 2010-09-02 LAB — TISSUE TRANSGLUTAMINASE, IGA: Tissue Transglutaminase Ab, IgA: 6.4 U/mL (ref <20)

## 2010-09-03 LAB — VITAMIN D 1,25 DIHYDROXY
Vitamin D 1, 25 (OH)2 Total: 64 pg/mL (ref 18–72)
Vitamin D3 1, 25 (OH)2: 27 pg/mL

## 2010-10-16 ENCOUNTER — Telehealth: Payer: Self-pay | Admitting: *Deleted

## 2010-10-16 DIAGNOSIS — F418 Other specified anxiety disorders: Secondary | ICD-10-CM

## 2010-10-16 MED ORDER — DESVENLAFAXINE SUCCINATE ER 50 MG PO TB24: 50.0000 mg | ORAL_TABLET | Freq: Every day | ORAL | Status: DC

## 2010-10-16 NOTE — Telephone Encounter (Signed)
Samples requested, ready, Pt aware

## 2010-10-29 ENCOUNTER — Encounter: Payer: Self-pay | Admitting: Internal Medicine

## 2010-10-29 ENCOUNTER — Ambulatory Visit (INDEPENDENT_AMBULATORY_CARE_PROVIDER_SITE_OTHER): Payer: BC Managed Care – PPO | Admitting: Internal Medicine

## 2010-10-29 VITALS — BP 118/82 | HR 89 | Temp 97.2°F | Resp 16 | Wt 190.2 lb

## 2010-10-29 DIAGNOSIS — F418 Other specified anxiety disorders: Secondary | ICD-10-CM

## 2010-10-29 DIAGNOSIS — I10 Essential (primary) hypertension: Secondary | ICD-10-CM

## 2010-10-29 DIAGNOSIS — F341 Dysthymic disorder: Secondary | ICD-10-CM

## 2010-10-29 MED ORDER — DESVENLAFAXINE SUCCINATE ER 50 MG PO TB24: 50.0000 mg | ORAL_TABLET | Freq: Every day | ORAL | Status: DC

## 2010-10-29 NOTE — Patient Instructions (Signed)
Anxiety and Panic Attacks Your caregiver has informed you that you are having an anxiety or panic attack. There may be many forms of this. Most of the time these attacks come suddenly and without warning. They come at any time of day, including periods of sleep, and at any time of life. They may be strong and unexplained. Although panic attacks are very scary, they are physically harmless. Sometimes the cause of your anxiety is not known. Anxiety is a protective mechanism of the body in its fight or flight mechanism. Most of these perceived danger situations are actually nonphysical situations (such as anxiety over losing a job). CAUSES The causes of an anxiety or panic attack are many. Panic attacks may occur in otherwise healthy people given a certain set of circumstances. There may be a genetic cause for panic attacks. Some medications may also have anxiety as a side effect. SYMPTOMS Some of the most common feelings are:  Intense terror.  Dizziness, feeling faint.   Hot and cold flashes.   Fear of going crazy.   Feelings that nothing is real.   Sweating.   Shaking.   Chest pain or a fast heartbeat (palpitations).  Smothering, choking sensations.   Feelings of impending doom and that death is near.   Tingling of extremities, this may be from over breathing.   Altered reality (derealization).   Being detached from yourself (depersonalization).   Several symptoms can be present to make up anxiety or panic attacks. DIAGNOSIS The evaluation by your caregiver will depend on the type of symptoms you are experiencing. The diagnosis of anxiety or pain attack is made when no physical illness can be determined to be a cause of the symptoms. TREATMENT Treatment to prevent anxiety and panic attacks may include:  Avoidance of circumstances that cause anxiety.   Reassurance and relaxation.   Regular exercise.   Relaxation therapies, such as yoga.   Psychotherapy with a psychiatrist  or therapist.   Avoidance of caffeine, alcohol and illegal drugs.   Prescribed medication.  SEEK IMMEDIATE MEDICAL CARE IF:  You experience panic attack symptoms that are different than your usual symptoms.   You have any worsening or concerning symptoms.  Document Released: 03/02/2005 Document Re-Released: 08/20/2009 [employer] Patient Information 2011 [place].

## 2010-10-29 NOTE — Progress Notes (Signed)
  Subjective:    Patient ID: Diana Reeves, female    DOB: [birth year], 60 y.o.   MRN: [id number]  HPI She returns for f/up and she tells me that she is doing well on pristiq and she wants to continue taking it. She has no side effects and rarely takes klonopin for anxiety.   Review of Systems  Constitutional: Negative.   HENT: Negative.   Eyes: Negative.   Respiratory: Negative.   Cardiovascular: Negative.   Gastrointestinal: Negative.   Genitourinary: Negative.   Musculoskeletal: Negative.   Skin: Negative.   Neurological: Negative for dizziness, tremors, seizures, syncope, facial asymmetry, speech difficulty, weakness, light-headedness, numbness and headaches.  Hematological: Negative for adenopathy. Does not bruise/bleed easily.  Psychiatric/Behavioral: Negative for suicidal ideas, hallucinations, behavioral problems, confusion, sleep disturbance, self-injury, dysphoric mood, decreased concentration and agitation. The patient is nervous/anxious. The patient is not hyperactive.        Objective:   Physical Exam  Vitals reviewed. Constitutional: She is oriented to person, place, and time. She appears well-developed and well-nourished. No distress.  HENT:  Head: Normocephalic and atraumatic.  Eyes: Conjunctivae and EOM are normal. Pupils are equal, round, and reactive to light.  Neck: Normal range of motion. Neck supple. No JVD present. No tracheal deviation present. No thyromegaly present.  Cardiovascular: Normal rate, regular rhythm, normal heart sounds and intact distal pulses.  Exam reveals no gallop and no friction rub.   No murmur heard. Pulmonary/Chest: Effort normal and breath sounds normal. No stridor. No respiratory distress. She has no wheezes. She has no rales. She exhibits no tenderness.  Abdominal: Soft. Bowel sounds are normal. She exhibits no distension and no mass. There is no tenderness. There is no rebound and no guarding.  Musculoskeletal: Normal range of motion.  She exhibits no edema and no tenderness.  Lymphadenopathy:    She has no cervical adenopathy.  Neurological: She is alert and oriented to person, place, and time. She has normal reflexes. She displays normal reflexes. No cranial nerve deficit. She exhibits normal muscle tone. Coordination normal.  Skin: Skin is warm and dry. No rash noted. She is not diaphoretic. No erythema. No pallor.  Psychiatric: She has a normal mood and affect. Her behavior is normal. Judgment and thought content normal.     Lab Results  Component Value Date   WBC 9.6 09/01/2010   HGB 14.9 09/01/2010   HCT 43.2 09/01/2010   PLT 247.0 09/01/2010   ALT 32 09/01/2010   AST 25 09/01/2010   NA 141 09/01/2010   K 3.7 09/01/2010   CL 100 09/01/2010   CREATININE 0.5 09/01/2010   BUN 14 09/01/2010   CO2 26 09/01/2010   TSH 0.71 09/01/2010       Assessment & Plan:

## 2010-10-30 ENCOUNTER — Encounter: Payer: Self-pay | Admitting: Internal Medicine

## 2010-10-30 NOTE — Assessment & Plan Note (Signed)
Her BP is well controlled 

## 2010-10-30 NOTE — Assessment & Plan Note (Signed)
She is doing well on pristiq and klonopin so she will continue those for now

## 2010-11-14 ENCOUNTER — Other Ambulatory Visit: Payer: Self-pay

## 2010-11-14 DIAGNOSIS — B379 Candidiasis, unspecified: Secondary | ICD-10-CM

## 2010-11-14 MED ORDER — FLUCONAZOLE 150 MG PO TABS: 150.0000 mg | ORAL_TABLET | Freq: Once | ORAL | Status: AC

## 2010-11-14 NOTE — Telephone Encounter (Signed)
PT. STATES SHE CAN NOT COME IN FOR AN OV TODAY. C-O VAGINAL IRRITATION & ITCHING. REQUESTING DIFLUCAN RX DR. Darnell Level HAS GIVEN HER BEFORE.

## 2010-12-17 LAB — HM MAMMOGRAPHY: HM Mammogram: NORMAL

## 2010-12-18 ENCOUNTER — Encounter: Payer: Self-pay | Admitting: Obstetrics and Gynecology

## 2010-12-22 ENCOUNTER — Encounter: Payer: Self-pay | Admitting: Gynecology

## 2010-12-22 DIAGNOSIS — H409 Unspecified glaucoma: Secondary | ICD-10-CM | POA: Insufficient documentation

## 2010-12-25 ENCOUNTER — Encounter: Payer: BC Managed Care – PPO | Admitting: Obstetrics and Gynecology

## 2010-12-26 ENCOUNTER — Ambulatory Visit (INDEPENDENT_AMBULATORY_CARE_PROVIDER_SITE_OTHER): Payer: BC Managed Care – PPO | Admitting: Obstetrics and Gynecology

## 2010-12-26 ENCOUNTER — Encounter: Payer: Self-pay | Admitting: Obstetrics and Gynecology

## 2010-12-26 VITALS — BP 130/80 | Ht 66.0 in | Wt 198.0 lb

## 2010-12-26 DIAGNOSIS — M719 Bursopathy, unspecified: Secondary | ICD-10-CM

## 2010-12-26 DIAGNOSIS — B3731 Acute candidiasis of vulva and vagina: Secondary | ICD-10-CM

## 2010-12-26 DIAGNOSIS — B373 Candidiasis of vulva and vagina: Secondary | ICD-10-CM

## 2010-12-26 DIAGNOSIS — Z01419 Encounter for gynecological examination (general) (routine) without abnormal findings: Secondary | ICD-10-CM | POA: Insufficient documentation

## 2010-12-26 DIAGNOSIS — M810 Age-related osteoporosis without current pathological fracture: Secondary | ICD-10-CM

## 2010-12-26 DIAGNOSIS — R823 Hemoglobinuria: Secondary | ICD-10-CM

## 2010-12-26 DIAGNOSIS — M7150 Other bursitis, not elsewhere classified, unspecified site: Secondary | ICD-10-CM

## 2010-12-26 DIAGNOSIS — R82998 Other abnormal findings in urine: Secondary | ICD-10-CM

## 2010-12-26 MED ORDER — CELECOXIB 100 MG PO CAPS: 100.0000 mg | ORAL_CAPSULE | Freq: Every day | ORAL | Status: AC

## 2010-12-26 MED ORDER — FLUCONAZOLE 200 MG PO TABS: 200.0000 mg | ORAL_TABLET | Freq: Every day | ORAL | Status: AC

## 2010-12-26 NOTE — Progress Notes (Signed)
Patient came to see me today for her annual GYN exam. She is using estradiol vaginal cream from [facility] with excellent results. She is having no other menopausal symptoms. She is up-to-date on her mammograms. She is having no vaginal bleeding. She has been on Actonel for 5 years now. She had osteoporosis but is continued to improve on bone densities. She takes calcium and vitamin D faithfully. She had no fractures. She's had a difficult year because her son had testicular cancer. She is now taking pristiq with excellent results. 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 She gets treated with Celebrex for bursitis but did not want to go see her orthopedist if a short course of Celebrex at work.  ROS: 12 system review done no change from previous except as noted above.  HEENT: Within normal limits. Neck: No masses. Supraclavicular lymph nodes: Not enlarged. Breasts: Examined in both sitting and lying position. Symmetrical without skin changes or masses. Abdomen: Soft no masses guarding or rebound. No hernias. Pelvic: External within normal limits. BUS within normal limits. Vaginal examination shows good estrogen effect, no cystocele enterocele or rectocele. Cervix and uterus absent. Adnexa within normal limits. Rectovaginal confirmatory. Extremities within normal limits. Wet prep positive for yeast.   Assessment: #1. Atrophic vaginitis #2. Osteoporosis #3. Bursitis #4. Yeast vaginitis  Plan: Continue estradiol vaginal cream 0.02%. Yearly prescription written. Diflucan 200 mg daily. Prescription for 7 given but she may stop when she is asymptomatic. Celebrex 100 mg tablets 1 daily #30 without refills. she will see her orthopedist if needs additional treatment.

## 2010-12-26 NOTE — Progress Notes (Signed)
Addended byLinde Gillis T on: 12/26/2010 05:13 PM   Modules accepted: Orders

## 2010-12-29 ENCOUNTER — Other Ambulatory Visit (HOSPITAL_COMMUNITY)
Admission: RE | Admit: 2010-12-29 | Discharge: 2010-12-29 | Disposition: A | Discharge status: Home / Self Care | Payer: BC Managed Care – PPO | Source: Ambulatory Visit | Attending: Obstetrics and Gynecology | Admitting: Obstetrics and Gynecology

## 2011-01-06 ENCOUNTER — Other Ambulatory Visit: Payer: Self-pay | Admitting: *Deleted

## 2011-01-06 MED ORDER — FLUCONAZOLE 200 MG PO TABS: 200.0000 mg | ORAL_TABLET | Freq: Every day | ORAL | Status: AC

## 2011-01-07 ENCOUNTER — Ambulatory Visit (INDEPENDENT_AMBULATORY_CARE_PROVIDER_SITE_OTHER): Admission: RE | Admit: 2011-01-07 | Payer: BC Managed Care – PPO | Source: Ambulatory Visit

## 2011-01-07 ENCOUNTER — Other Ambulatory Visit: Payer: Self-pay | Admitting: Obstetrics and Gynecology

## 2011-01-07 DIAGNOSIS — M810 Age-related osteoporosis without current pathological fracture: Secondary | ICD-10-CM

## 2011-01-08 ENCOUNTER — Ambulatory Visit (INDEPENDENT_AMBULATORY_CARE_PROVIDER_SITE_OTHER): Payer: BC Managed Care – PPO | Admitting: Obstetrics and Gynecology

## 2011-01-08 DIAGNOSIS — R350 Frequency of micturition: Secondary | ICD-10-CM

## 2011-01-08 DIAGNOSIS — Z780 Asymptomatic menopausal state: Secondary | ICD-10-CM

## 2011-01-08 DIAGNOSIS — N898 Other specified noninflammatory disorders of vagina: Secondary | ICD-10-CM

## 2011-01-08 DIAGNOSIS — N951 Menopausal and female climacteric states: Secondary | ICD-10-CM

## 2011-01-08 DIAGNOSIS — N899 Noninflammatory disorder of vagina, unspecified: Secondary | ICD-10-CM

## 2011-01-08 DIAGNOSIS — B373 Candidiasis of vulva and vagina: Secondary | ICD-10-CM

## 2011-01-08 DIAGNOSIS — M839 Adult osteomalacia, unspecified: Secondary | ICD-10-CM

## 2011-01-08 MED ORDER — KETOCONAZOLE 200 MG PO TABS: 200.0000 mg | ORAL_TABLET | Freq: Two times a day (BID) | ORAL | Status: AC

## 2011-01-08 MED ORDER — TERCONAZOLE 0.4 % VA CREA: 1.0000 | TOPICAL_CREAM | Freq: Every day | VAGINAL | Status: AC

## 2011-01-08 NOTE — Progress Notes (Signed)
Patient came back to see me today with persistent vulvar and vaginal irritation. She had taken 7 days of Diflucan. She has some urinary frequency as well. She's previously tried probiotics   but keeps getting yeast infections. She is not diabetic. She also wanted to discuss HRT for her sleep disturbance.  Exam: Diana Reeves present External: Within normal limits. BUS within normal limits. Vaginal exam: Shows good estrogen effect positive KOH.  Assessment: Persistent yeast infection. Sleep disturbance.  Plan: Terconazole 7 cream. Nizoral 200 mg twice a day for 7 days. Discussed a trial of oral estrogen. She previously had mastodynia with Premarin. She will decide and let me know. If we treat her we will use estradiol half a milligram daily.

## 2011-02-03 ENCOUNTER — Telehealth: Payer: Self-pay | Admitting: Student

## 2011-02-03 DIAGNOSIS — F418 Other specified anxiety disorders: Secondary | ICD-10-CM

## 2011-02-03 NOTE — Telephone Encounter (Signed)
Pt. Called requesting a refill on prestiq. Please advise.

## 2011-02-04 MED ORDER — DESVENLAFAXINE SUCCINATE ER 50 MG PO TB24: 50.0000 mg | ORAL_TABLET | Freq: Every day | ORAL | Status: DC

## 2011-02-04 NOTE — Telephone Encounter (Signed)
Rx sent to her pharmacy 

## 2011-02-06 NOTE — Telephone Encounter (Signed)
Received fax from pharmacy stating that insurance will not cover Pristiq w/o prior auth. Covered alternatives are buproprion SR/XL, Gen SSRI, venlfxine, or cymbalta. Please advise Thanks

## 2011-02-08 MED ORDER — DULOXETINE HCL 60 MG PO CPEP: 60.0000 mg | ORAL_CAPSULE | Freq: Every day | ORAL | Status: DC

## 2011-02-08 NOTE — Telephone Encounter (Signed)
Changed to cymbalta

## 2011-02-09 NOTE — Telephone Encounter (Signed)
Pharmacy notified via fax.

## 2011-03-04 ENCOUNTER — Encounter: Payer: Self-pay | Admitting: Women's Health

## 2011-03-04 ENCOUNTER — Ambulatory Visit (INDEPENDENT_AMBULATORY_CARE_PROVIDER_SITE_OTHER): Payer: BC Managed Care – PPO | Admitting: Women's Health

## 2011-03-04 DIAGNOSIS — B373 Candidiasis of vulva and vagina: Secondary | ICD-10-CM

## 2011-03-04 DIAGNOSIS — N390 Urinary tract infection, site not specified: Secondary | ICD-10-CM

## 2011-03-04 DIAGNOSIS — R350 Frequency of micturition: Secondary | ICD-10-CM

## 2011-03-04 MED ORDER — FLUCONAZOLE 150 MG PO TABS: 150.0000 mg | ORAL_TABLET | Freq: Once | ORAL | Status: AC

## 2011-03-04 MED ORDER — NITROFURANTOIN MONOHYD MACRO 100 MG PO CAPS: 100.0000 mg | ORAL_CAPSULE | Freq: Two times a day (BID) | ORAL | Status: AC

## 2011-03-04 NOTE — Progress Notes (Signed)
Patient ID: QUYNH BASSO, female   DOB: [birth year], 60 y.o.   MRN: [id number] Complaint of increased urinary frequency, burning, urgency and bladder pressure. Also states has used Terazol and Diflucan for vaginal irritation. No discharge or fever.  Exam: +1 rectocele, vaginal introitus erythematous, speculum exam scant discharge vaginal walls atrophic, non-erythematous. Wet prep positive for yeast. UA- TNTC WBCs and +2 bacteria  UTI Yeast  Plan: Diflucan 150x1 dose with a refill. Encouraged to use A and D. ointment and externally. Macrobid one by mouth twice a day for  7 days. Encouraged to continue Vagifem one applicator twice weekly. Instructed to call or return if symptoms do not resolve. UTI prevention discussed.

## 2011-08-11 ENCOUNTER — Other Ambulatory Visit: Payer: Self-pay | Admitting: Obstetrics and Gynecology

## 2011-08-24 ENCOUNTER — Telehealth: Payer: Self-pay | Admitting: Internal Medicine

## 2011-08-24 NOTE — Telephone Encounter (Signed)
Received 3 pages from [facility] center. sd 08/24/11

## 2011-08-26 ENCOUNTER — Ambulatory Visit (INDEPENDENT_AMBULATORY_CARE_PROVIDER_SITE_OTHER): Payer: BC Managed Care – PPO | Admitting: Women's Health

## 2011-08-26 ENCOUNTER — Encounter: Payer: Self-pay | Admitting: Women's Health

## 2011-08-26 VITALS — BP 138/90

## 2011-08-26 DIAGNOSIS — L293 Anogenital pruritus, unspecified: Secondary | ICD-10-CM

## 2011-08-26 DIAGNOSIS — N898 Other specified noninflammatory disorders of vagina: Secondary | ICD-10-CM

## 2011-08-26 LAB — WET PREP FOR TRICH, YEAST, CLUE: Clue Cells Wet Prep HPF POC: NONE SEEN

## 2011-08-26 MED ORDER — ESTRADIOL 2 MG VA RING: 2.0000 mg | VAGINAL_RING | VAGINAL | Status: AC

## 2011-08-26 NOTE — Progress Notes (Signed)
Patient ID: Diana Reeves, female   DOB: August 28, 1950, 61 y.o.   MRN: [id number] Presents with complaint of vaginal burning, irritation, dysuria only at initiation of urination. Denies any pain at the end of the stream or urinary symptoms other than when the urine hits the skin. States uses vaginal estrogen twice weekly, had better relief with Vagifem 3 times per week but unable to continue due to cost. Has relief of vaginal burning on days of using vaginal estrogen, symptom of increased vaginal burning occurs between uses. Not sexually active. Hysterectomy.  Exam: External genitalia erythematous at the introitus, speculum exam vaginal walls slightly atrophic minimal discharge wet prep negative. Bimanual no adnexal fullness or tenderness.  Vaginal atrophy/burning  Plan: Estring 2 mg sample given and placed. Reviewed not to use other vaginal estrogens, coupon given for refills will keep in place 3 months. Instructed to call if no relief of symptoms.

## 2011-09-04 ENCOUNTER — Other Ambulatory Visit: Payer: Self-pay | Admitting: Dermatology

## 2011-09-08 ENCOUNTER — Other Ambulatory Visit: Payer: Self-pay | Admitting: Internal Medicine

## 2011-09-21 ENCOUNTER — Telehealth: Payer: Self-pay | Admitting: *Deleted

## 2011-09-21 NOTE — Telephone Encounter (Signed)
Patient called requesting information on PA for nexium

## 2011-09-21 NOTE — Telephone Encounter (Signed)
No PA request or medication denial received from pharmacy or insurance company as of yet.

## 2011-09-22 NOTE — Telephone Encounter (Signed)
Ed with [employer] called and advised to call [employer] at [phone] ([id number] last PA approval #) to obtain PA on nexium 40 mg. Called [employer] spoke with rep,  nexium approved 09/01/11-09/22/15 case #[id number]. Pharmacy notified via fax

## 2011-10-03 ENCOUNTER — Other Ambulatory Visit: Payer: Self-pay | Admitting: Internal Medicine

## 2012-01-27 ENCOUNTER — Encounter: Payer: Self-pay | Admitting: Internal Medicine

## 2012-02-08 ENCOUNTER — Encounter: Payer: Self-pay | Admitting: Internal Medicine

## 2012-03-25 ENCOUNTER — Other Ambulatory Visit: Payer: Self-pay | Admitting: Internal Medicine

## 2012-03-25 ENCOUNTER — Telehealth: Payer: Self-pay | Admitting: Internal Medicine

## 2012-03-25 NOTE — Telephone Encounter (Signed)
Per patient the pharmacy should be contacting the office about a prior auth for her Nexium and she was calling to see if she could pick up some samples to last until she can pick up a refill

## 2012-03-25 NOTE — Telephone Encounter (Signed)
Pt came by go get samples, we are out. Awaiting preauth

## 2012-03-28 NOTE — Telephone Encounter (Signed)
PA form received and completed, now pending insurance approval.

## 2012-04-04 NOTE — Telephone Encounter (Signed)
Called insurance x 2, first time held for 20 min due to high call volume. Second time held ten mins then had to hang up. Will try again later

## 2012-04-15 NOTE — Telephone Encounter (Signed)
Called patient Diana Reeves needing to find out if she has received your medication or have heard anything from her insurance company.

## 2012-06-02 ENCOUNTER — Ambulatory Visit (INDEPENDENT_AMBULATORY_CARE_PROVIDER_SITE_OTHER): Payer: BC Managed Care – PPO | Admitting: Women's Health

## 2012-06-02 DIAGNOSIS — N760 Acute vaginitis: Secondary | ICD-10-CM

## 2012-06-02 LAB — URINALYSIS W MICROSCOPIC + REFLEX CULTURE
Bilirubin Urine: NEGATIVE
Ketones, ur: NEGATIVE mg/dL
Protein, ur: NEGATIVE mg/dL
Urobilinogen, UA: 0.2 mg/dL (ref 0.0–1.0)

## 2012-06-02 MED ORDER — METRONIDAZOLE 0.75 % VA GEL: VAGINAL | Status: DC

## 2012-06-02 NOTE — Progress Notes (Signed)
Patient ID: Diana Reeves, female   DOB: May 17, 1950, 62 y.o.   MRN: [id number] Presents with urinary frequency with urgency and vaginal irritation with minimal itching, no odor. Uses Estring 2 mg every 3 months, removed several days ago and has not replaced due to  irritation. Had  problems with vaginal dryness good relief with the Estring. Denies abdominal pain, fever, pain or burning with urination. TAH LSO/ uterine prolapse, not sexually active. (Son who had stage IV testicular cancer with metastasis to lungs in remission and was recently married.)  Exam: Appears well, external genitalia erythematous introitus  extending to rectum. Mild rectocele/cystocele, speculum exam minimal erythema with scant white discharge. Wet prep positive for clue cells, and TNTC bacteria. UA: Negative.  Bacteria vaginosis Urinary frequency with urgency  Plan: MetroGel vaginal cream 1 applicator at bedtime x5, alcohol precautions reviewed. Replace Estring after completing MetroGel treatment. Instructed to call if symptoms do not resolve. Urine culture pending.

## 2012-06-02 NOTE — Patient Instructions (Signed)
Bacterial Vaginosis Bacterial vaginosis (BV) is a vaginal infection where the normal balance of bacteria in the vagina is disrupted. The normal balance is then replaced by an overgrowth of certain bacteria. There are several different kinds of bacteria that can cause BV. BV is the most common vaginal infection in women of childbearing age. CAUSES   The cause of BV is not fully understood. BV develops when there is an increase or imbalance of harmful bacteria.  Some activities or behaviors can upset the normal balance of bacteria in the vagina and put women at increased risk including:  Having a new sex partner or multiple sex partners.  Douching.  Using an intrauterine device (IUD) for contraception.  It is not clear what role sexual activity plays in the development of BV. However, women that have never had sexual intercourse are rarely infected with BV. Women do not get BV from toilet seats, bedding, swimming pools or from touching objects around them.  SYMPTOMS   Grey vaginal discharge.  A fish-like odor with discharge, especially after sexual intercourse.  Itching or burning of the vagina and vulva.  Burning or pain with urination.  Some women have no signs or symptoms at all. DIAGNOSIS  Your caregiver must examine the vagina for signs of BV. Your caregiver will perform lab tests and look at the sample of vaginal fluid through a microscope. They will look for bacteria and abnormal cells (clue cells), a pH test higher than 4.5, and a positive amine test all associated with BV.  RISKS AND COMPLICATIONS   Pelvic inflammatory disease (PID).  Infections following gynecology surgery.  Developing HIV.  Developing herpes virus. TREATMENT  Sometimes BV will clear up without treatment. However, all women with symptoms of BV should be treated to avoid complications, especially if gynecology surgery is planned. Female partners generally do not need to be treated. However, BV may spread  between female sex partners so treatment is helpful in preventing a recurrence of BV.   BV may be treated with antibiotics. The antibiotics come in either pill or vaginal cream forms. Either can be used with nonpregnant or pregnant women, but the recommended dosages differ. These antibiotics are not harmful to the baby.  BV can recur after treatment. If this happens, a second round of antibiotics will often be prescribed.  Treatment is important for pregnant women. If not treated, BV can cause a premature delivery, especially for a pregnant woman who had a premature birth in the past. All pregnant women who have symptoms of BV should be checked and treated.  For chronic reoccurrence of BV, treatment with a type of prescribed gel vaginally twice a week is helpful. HOME CARE INSTRUCTIONS   Finish all medication as directed by your caregiver.  Do not have sex until treatment is completed.  Tell your sexual partner that you have a vaginal infection. They should see their caregiver and be treated if they have problems, such as a mild rash or itching.  Practice safe sex. Use condoms. Only have 1 sex partner. PREVENTION  Basic prevention steps can help reduce the risk of upsetting the natural balance of bacteria in the vagina and developing BV:  Do not have sexual intercourse (be abstinent).  Do not douche.  Use all of the medicine prescribed for treatment of BV, even if the signs and symptoms go away.  Tell your sex partner if you have BV. That way, they can be treated, if needed, to prevent reoccurrence. SEEK MEDICAL CARE IF:  Your symptoms are not improving after 3 days of treatment.  You have increased discharge, pain, or fever. MAKE SURE YOU:   Understand these instructions.  Will watch your condition.  Will get help right away if you are not doing well or get worse. FOR MORE INFORMATION  Division of STD Prevention (DSTDP), Centers for Disease Control and Prevention:  [employer] [place] ([place]): www.ashastd.org  Document Released: 03/02/2005 Document Revised: 05/25/2011 Document Reviewed: 08/23/2008 [facility] Patient Information 2013 [place].

## 2012-06-04 LAB — URINE CULTURE

## 2012-06-08 ENCOUNTER — Telehealth: Payer: Self-pay | Admitting: Internal Medicine

## 2012-06-08 MED ORDER — DULOXETINE HCL 60 MG PO CPEP: ORAL_CAPSULE | ORAL | Status: DC

## 2012-06-08 NOTE — Telephone Encounter (Signed)
Cymbalta rx sent to [place].

## 2012-06-08 NOTE — Telephone Encounter (Signed)
Called to request additional Cymbalta 60 mg be called out to [facility] at [facility] until scheduled office visit for 06/20/12.  MD gave her one additional month and ordered needed office visit. Has 5 remaining tablets.  Please call if there is a problem refill request.

## 2012-06-20 ENCOUNTER — Encounter: Payer: Self-pay | Admitting: Internal Medicine

## 2012-06-20 ENCOUNTER — Ambulatory Visit (INDEPENDENT_AMBULATORY_CARE_PROVIDER_SITE_OTHER): Payer: BC Managed Care – PPO | Admitting: Internal Medicine

## 2012-06-20 ENCOUNTER — Other Ambulatory Visit (INDEPENDENT_AMBULATORY_CARE_PROVIDER_SITE_OTHER): Payer: BC Managed Care – PPO

## 2012-06-20 VITALS — BP 140/88 | HR 73 | Temp 97.3°F | Resp 16 | Ht 66.0 in | Wt 205.8 lb

## 2012-06-20 DIAGNOSIS — M839 Adult osteomalacia, unspecified: Secondary | ICD-10-CM

## 2012-06-20 DIAGNOSIS — K219 Gastro-esophageal reflux disease without esophagitis: Secondary | ICD-10-CM

## 2012-06-20 DIAGNOSIS — M76899 Other specified enthesopathies of unspecified lower limb, excluding foot: Secondary | ICD-10-CM

## 2012-06-20 DIAGNOSIS — I10 Essential (primary) hypertension: Secondary | ICD-10-CM

## 2012-06-20 DIAGNOSIS — F341 Dysthymic disorder: Secondary | ICD-10-CM

## 2012-06-20 DIAGNOSIS — M810 Age-related osteoporosis without current pathological fracture: Secondary | ICD-10-CM

## 2012-06-20 DIAGNOSIS — Z23 Encounter for immunization: Secondary | ICD-10-CM

## 2012-06-20 DIAGNOSIS — K222 Esophageal obstruction: Secondary | ICD-10-CM

## 2012-06-20 DIAGNOSIS — M7061 Trochanteric bursitis, right hip: Secondary | ICD-10-CM | POA: Insufficient documentation

## 2012-06-20 DIAGNOSIS — Z0000 Encounter for general adult medical examination without abnormal findings: Secondary | ICD-10-CM

## 2012-06-20 DIAGNOSIS — F418 Other specified anxiety disorders: Secondary | ICD-10-CM

## 2012-06-20 DIAGNOSIS — Z2911 Encounter for prophylactic immunotherapy for respiratory syncytial virus (RSV): Secondary | ICD-10-CM

## 2012-06-20 LAB — COMPREHENSIVE METABOLIC PANEL
ALT: 35 U/L (ref 0–35)
AST: 34 U/L (ref 0–37)
Alkaline Phosphatase: 69 U/L (ref 39–117)
CO2: 27 mEq/L (ref 19–32)
Creatinine, Ser: 0.8 mg/dL (ref 0.4–1.2)
Sodium: 141 mEq/L (ref 135–145)
Total Bilirubin: 0.6 mg/dL (ref 0.3–1.2)
Total Protein: 7.2 g/dL (ref 6.0–8.3)

## 2012-06-20 LAB — URINALYSIS, ROUTINE W REFLEX MICROSCOPIC
Bilirubin Urine: NEGATIVE
Leukocytes, UA: NEGATIVE
Nitrite: NEGATIVE
Total Protein, Urine: NEGATIVE
pH: 6 (ref 5.0–8.0)

## 2012-06-20 LAB — LIPID PANEL
Cholesterol: 163 mg/dL (ref 0–200)
VLDL: 24.6 mg/dL (ref 0.0–40.0)

## 2012-06-20 LAB — CBC WITH DIFFERENTIAL/PLATELET
Basophils Absolute: 0 10*3/uL (ref 0.0–0.1)
HCT: 39.3 % (ref 36.0–46.0)
Hemoglobin: 13.5 g/dL (ref 12.0–15.0)
Lymphs Abs: 2.3 10*3/uL (ref 0.7–4.0)
MCHC: 34.3 g/dL (ref 30.0–36.0)
MCV: 82.5 fl (ref 78.0–100.0)
Monocytes Absolute: 0.4 10*3/uL (ref 0.1–1.0)
Monocytes Relative: 6.4 % (ref 3.0–12.0)
Neutro Abs: 3.4 10*3/uL (ref 1.4–7.7)
Platelets: 261 10*3/uL (ref 150.0–400.0)
RDW: 14.5 % (ref 11.5–14.6)

## 2012-06-20 LAB — VITAMIN B12: Vitamin B-12: 696 pg/mL (ref 211–911)

## 2012-06-20 LAB — TSH: TSH: 1.01 u[IU]/mL (ref 0.35–5.50)

## 2012-06-20 MED ORDER — ZOLPIDEM TARTRATE 10 MG PO TABS: 10.0000 mg | ORAL_TABLET | Freq: Every evening | ORAL | Status: DC | PRN

## 2012-06-20 MED ORDER — DULOXETINE HCL 60 MG PO CPEP: ORAL_CAPSULE | ORAL | Status: DC

## 2012-06-20 NOTE — Assessment & Plan Note (Signed)
I will recheck her Vit D level today

## 2012-06-20 NOTE — Assessment & Plan Note (Signed)
She has been on nexium for many years so I will check her Vit B12 level

## 2012-06-20 NOTE — Patient Instructions (Signed)
Preventive Care for Adults, Female A healthy lifestyle and preventive care can promote health and wellness. Preventive health guidelines for women include the following key practices.  A routine yearly physical is a good way to check with your caregiver about your health and preventive screening. It is a chance to share any concerns and updates on your health, and to receive a thorough exam.  Visit your dentist for a routine exam and preventive care every 6 months. Brush your teeth twice a day and floss once a day. Good oral hygiene prevents tooth decay and gum disease.  The frequency of eye exams is based on your age, health, family medical history, use of contact lenses, and other factors. Follow your caregiver's recommendations for frequency of eye exams.  Eat a healthy diet. Foods like vegetables, fruits, whole grains, low-fat dairy products, and lean protein foods contain the nutrients you need without too many calories. Decrease your intake of foods high in solid fats, added sugars, and salt. Eat the right amount of calories for you.Get information about a proper diet from your caregiver, if necessary.  Regular physical exercise is one of the most important things you can do for your health. Most adults should get at least 150 minutes of moderate-intensity exercise (any activity that increases your heart rate and causes you to sweat) each week. In addition, most adults need muscle-strengthening exercises on 2 or more days a week.  Maintain a healthy weight. The body mass index (BMI) is a screening tool to identify possible weight problems. It provides an estimate of body fat based on height and weight. Your caregiver can help determine your BMI, and can help you achieve or maintain a healthy weight.For adults 20 years and older:  A BMI below 18.5 is considered underweight.  A BMI of 18.5 to 24.9 is normal.  A BMI of 25 to 29.9 is considered overweight.  A BMI of 30 and above is  considered obese.  Maintain normal blood lipids and cholesterol levels by exercising and minimizing your intake of saturated fat. Eat a balanced diet with plenty of fruit and vegetables. Blood tests for lipids and cholesterol should begin at age 20 and be repeated every 5 years. If your lipid or cholesterol levels are high, you are over 50, or you are at high risk for heart disease, you may need your cholesterol levels checked more frequently.Ongoing high lipid and cholesterol levels should be treated with medicines if diet and exercise are not effective.  If you smoke, find out from your caregiver how to quit. If you do not use tobacco, do not start.  If you are pregnant, do not drink alcohol. If you are breastfeeding, be very cautious about drinking alcohol. If you are not pregnant and choose to drink alcohol, do not exceed 1 drink per day. One drink is considered to be 12 ounces (355 mL) of beer, 5 ounces (148 mL) of wine, or 1.5 ounces (44 mL) of liquor.  Avoid use of street drugs. Do not share needles with anyone. Ask for help if you need support or instructions about stopping the use of drugs.  High blood pressure causes heart disease and increases the risk of stroke. Your blood pressure should be checked at least every 1 to 2 years. Ongoing high blood pressure should be treated with medicines if weight loss and exercise are not effective.  If you are 55 to 64 years old, ask your caregiver if you should take aspirin to prevent strokes.  Diabetes   screening involves taking a blood sample to check your fasting blood sugar level. This should be done once every 3 years, after age 45, if you are within normal weight and without risk factors for diabetes. Testing should be considered at a younger age or be carried out more frequently if you are overweight and have at least 1 risk factor for diabetes.  Breast cancer screening is essential preventive care for women. You should practice "breast  self-awareness." This means understanding the normal appearance and feel of your breasts and may include breast self-examination. Any changes detected, no matter how small, should be reported to a caregiver. Women in their 20s and 30s should have a clinical breast exam (CBE) by a caregiver as part of a regular health exam every 1 to 3 years. After age 40, women should have a CBE every year. Starting at age 40, women should consider having a mammography (breast X-ray test) every year. Women who have a family history of breast cancer should talk to their caregiver about genetic screening. Women at a high risk of breast cancer should talk to their caregivers about having magnetic resonance imaging (MRI) and a mammography every year.  The Pap test is a screening test for cervical cancer. A Pap test can show cell changes on the cervix that might become cervical cancer if left untreated. A Pap test is a procedure in which cells are obtained and examined from the lower end of the uterus (cervix).  Women should have a Pap test starting at age 21.  Between ages 21 and 29, Pap tests should be repeated every 2 years.  Beginning at age 30, you should have a Pap test every 3 years as long as the past 3 Pap tests have been normal.  Some women have medical problems that increase the chance of getting cervical cancer. Talk to your caregiver about these problems. It is especially important to talk to your caregiver if a new problem develops soon after your last Pap test. In these cases, your caregiver may recommend more frequent screening and Pap tests.  The above recommendations are the same for women who have or have not gotten the vaccine for human papillomavirus (HPV).  If you had a hysterectomy for a problem that was not cancer or a condition that could lead to cancer, then you no longer need Pap tests. Even if you no longer need a Pap test, a regular exam is a good idea to make sure no other problems are  starting.  If you are between ages 65 and 70, and you have had normal Pap tests going back 10 years, you no longer need Pap tests. Even if you no longer need a Pap test, a regular exam is a good idea to make sure no other problems are starting.  If you have had past treatment for cervical cancer or a condition that could lead to cancer, you need Pap tests and screening for cancer for at least 20 years after your treatment.  If Pap tests have been discontinued, risk factors (such as a new sexual partner) need to be reassessed to determine if screening should be resumed.  The HPV test is an additional test that may be used for cervical cancer screening. The HPV test looks for the virus that can cause the cell changes on the cervix. The cells collected during the Pap test can be tested for HPV. The HPV test could be used to screen women aged 30 years and older, and should   be used in women of any age who have unclear Pap test results. After the age of 30, women should have HPV testing at the same frequency as a Pap test.  Colorectal cancer can be detected and often prevented. Most routine colorectal cancer screening begins at the age of 50 and continues through age 75. However, your caregiver may recommend screening at an earlier age if you have risk factors for colon cancer. On a yearly basis, your caregiver may provide home test kits to check for hidden blood in the stool. Use of a small camera at the end of a tube, to directly examine the colon (sigmoidoscopy or colonoscopy), can detect the earliest forms of colorectal cancer. Talk to your caregiver about this at age 50, when routine screening begins. Direct examination of the colon should be repeated every 5 to 10 years through age 75, unless early forms of pre-cancerous polyps or small growths are found.  Hepatitis C blood testing is recommended for all people born from 1945 through 1965 and any individual with known risks for hepatitis C.  Practice  safe sex. Use condoms and avoid high-risk sexual practices to reduce the spread of sexually transmitted infections (STIs). STIs include gonorrhea, chlamydia, syphilis, trichomonas, herpes, HPV, and human immunodeficiency virus (HIV). Herpes, HIV, and HPV are viral illnesses that have no cure. They can result in disability, cancer, and death. Sexually active women aged 25 and younger should be checked for chlamydia. Older women with new or multiple partners should also be tested for chlamydia. Testing for other STIs is recommended if you are sexually active and at increased risk.  Osteoporosis is a disease in which the bones lose minerals and strength with aging. This can result in serious bone fractures. The risk of osteoporosis can be identified using a bone density scan. Women ages 65 and over and women at risk for fractures or osteoporosis should discuss screening with their caregivers. Ask your caregiver whether you should take a calcium supplement or vitamin D to reduce the rate of osteoporosis.  Menopause can be associated with physical symptoms and risks. Hormone replacement therapy is available to decrease symptoms and risks. You should talk to your caregiver about whether hormone replacement therapy is right for you.  Use sunscreen with sun protection factor (SPF) of 30 or more. Apply sunscreen liberally and repeatedly throughout the day. You should seek shade when your shadow is shorter than you. Protect yourself by wearing long sleeves, pants, a wide-brimmed hat, and sunglasses year round, whenever you are outdoors.  Once a month, do a whole body skin exam, using a mirror to look at the skin on your back. Notify your caregiver of new moles, moles that have irregular borders, moles that are larger than a pencil eraser, or moles that have changed in shape or color.  Stay current with required immunizations.  Influenza. You need a dose every fall (or winter). The composition of the flu vaccine  changes each year, so being vaccinated once is not enough.  Pneumococcal polysaccharide. You need 1 to 2 doses if you smoke cigarettes or if you have certain chronic medical conditions. You need 1 dose at age 65 (or older) if you have never been vaccinated.  Tetanus, diphtheria, pertussis (Tdap, Td). Get 1 dose of Tdap vaccine if you are younger than age 65, are over 65 and have contact with an infant, are a healthcare worker, are pregnant, or simply want to be protected from whooping cough. After that, you need a Td   booster dose every 10 years. Consult your caregiver if you have not had at least 3 tetanus and diphtheria-containing shots sometime in your life or have a deep or dirty wound.  HPV. You need this vaccine if you are a woman age 26 or younger. The vaccine is given in 3 doses over 6 months.  Measles, mumps, rubella (MMR). You need at least 1 dose of MMR if you were born in 1957 or later. You may also need a second dose.  Meningococcal. If you are age 19 to 21 and a first-year college student living in a residence hall, or have one of several medical conditions, you need to get vaccinated against meningococcal disease. You may also need additional booster doses.  Zoster (shingles). If you are age 60 or older, you should get this vaccine.  Varicella (chickenpox). If you have never had chickenpox or you were vaccinated but received only 1 dose, talk to your caregiver to find out if you need this vaccine.  Hepatitis A. You need this vaccine if you have a specific risk factor for hepatitis A virus infection or you simply wish to be protected from this disease. The vaccine is usually given as 2 doses, 6 to 18 months apart.  Hepatitis B. You need this vaccine if you have a specific risk factor for hepatitis B virus infection or you simply wish to be protected from this disease. The vaccine is given in 3 doses, usually over 6 months. Preventive Services / Frequency Ages 19 to 39  Blood  pressure check.** / Every 1 to 2 years.  Lipid and cholesterol check.** / Every 5 years beginning at age 20.  Clinical breast exam.** / Every 3 years for women in their 20s and 30s.  Pap test.** / Every 2 years from ages 21 through 29. Every 3 years starting at age 30 through age 65 or 70 with a history of 3 consecutive normal Pap tests.  HPV screening.** / Every 3 years from ages 30 through ages 65 to 70 with a history of 3 consecutive normal Pap tests.  Hepatitis C blood test.** / For any individual with known risks for hepatitis C.  Skin self-exam. / Monthly.  Influenza immunization.** / Every year.  Pneumococcal polysaccharide immunization.** / 1 to 2 doses if you smoke cigarettes or if you have certain chronic medical conditions.  Tetanus, diphtheria, pertussis (Tdap, Td) immunization. / A one-time dose of Tdap vaccine. After that, you need a Td booster dose every 10 years.  HPV immunization. / 3 doses over 6 months, if you are 26 and younger.  Measles, mumps, rubella (MMR) immunization. / You need at least 1 dose of MMR if you were born in 1957 or later. You may also need a second dose.  Meningococcal immunization. / 1 dose if you are age 19 to 21 and a first-year college student living in a residence hall, or have one of several medical conditions, you need to get vaccinated against meningococcal disease. You may also need additional booster doses.  Varicella immunization.** / Consult your caregiver.  Hepatitis A immunization.** / Consult your caregiver. 2 doses, 6 to 18 months apart.  Hepatitis B immunization.** / Consult your caregiver. 3 doses usually over 6 months. Ages 40 to 64  Blood pressure check.** / Every 1 to 2 years.  Lipid and cholesterol check.** / Every 5 years beginning at age 20.  Clinical breast exam.** / Every year after age 40.  Mammogram.** / Every year beginning at age 40   and continuing for as long as you are in good health. Consult with your  caregiver.  Pap test.** / Every 3 years starting at age 30 through age 65 or 70 with a history of 3 consecutive normal Pap tests.  HPV screening.** / Every 3 years from ages 30 through ages 65 to 70 with a history of 3 consecutive normal Pap tests.  Fecal occult blood test (FOBT) of stool. / Every year beginning at age 50 and continuing until age 75. You may not need to do this test if you get a colonoscopy every 10 years.  Flexible sigmoidoscopy or colonoscopy.** / Every 5 years for a flexible sigmoidoscopy or every 10 years for a colonoscopy beginning at age 50 and continuing until age 75.  Hepatitis C blood test.** / For all people born from 1945 through 1965 and any individual with known risks for hepatitis C.  Skin self-exam. / Monthly.  Influenza immunization.** / Every year.  Pneumococcal polysaccharide immunization.** / 1 to 2 doses if you smoke cigarettes or if you have certain chronic medical conditions.  Tetanus, diphtheria, pertussis (Tdap, Td) immunization.** / A one-time dose of Tdap vaccine. After that, you need a Td booster dose every 10 years.  Measles, mumps, rubella (MMR) immunization. / You need at least 1 dose of MMR if you were born in 1957 or later. You may also need a second dose.  Varicella immunization.** / Consult your caregiver.  Meningococcal immunization.** / Consult your caregiver.  Hepatitis A immunization.** / Consult your caregiver. 2 doses, 6 to 18 months apart.  Hepatitis B immunization.** / Consult your caregiver. 3 doses, usually over 6 months. Ages 65 and over  Blood pressure check.** / Every 1 to 2 years.  Lipid and cholesterol check.** / Every 5 years beginning at age 20.  Clinical breast exam.** / Every year after age 40.  Mammogram.** / Every year beginning at age 40 and continuing for as long as you are in good health. Consult with your caregiver.  Pap test.** / Every 3 years starting at age 30 through age 65 or 70 with a 3  consecutive normal Pap tests. Testing can be stopped between 65 and 70 with 3 consecutive normal Pap tests and no abnormal Pap or HPV tests in the past 10 years.  HPV screening.** / Every 3 years from ages 30 through ages 65 or 70 with a history of 3 consecutive normal Pap tests. Testing can be stopped between 65 and 70 with 3 consecutive normal Pap tests and no abnormal Pap or HPV tests in the past 10 years.  Fecal occult blood test (FOBT) of stool. / Every year beginning at age 50 and continuing until age 75. You may not need to do this test if you get a colonoscopy every 10 years.  Flexible sigmoidoscopy or colonoscopy.** / Every 5 years for a flexible sigmoidoscopy or every 10 years for a colonoscopy beginning at age 50 and continuing until age 75.  Hepatitis C blood test.** / For all people born from 1945 through 1965 and any individual with known risks for hepatitis C.  Osteoporosis screening.** / A one-time screening for women ages 65 and over and women at risk for fractures or osteoporosis.  Skin self-exam. / Monthly.  Influenza immunization.** / Every year.  Pneumococcal polysaccharide immunization.** / 1 dose at age 65 (or older) if you have never been vaccinated.  Tetanus, diphtheria, pertussis (Tdap, Td) immunization. / A one-time dose of Tdap vaccine if you are over   65 and have contact with an infant, are a healthcare worker, or simply want to be protected from whooping cough. After that, you need a Td booster dose every 10 years.  Varicella immunization.** / Consult your caregiver.  Meningococcal immunization.** / Consult your caregiver.  Hepatitis A immunization.** / Consult your caregiver. 2 doses, 6 to 18 months apart.  Hepatitis B immunization.** / Check with your caregiver. 3 doses, usually over 6 months. ** Family history and personal history of risk and conditions may change your caregiver's recommendations. Document Released: 04/28/2001 Document Revised: 05/25/2011  Document Reviewed: 07/28/2010 [employer] Patient Information 2013 [employer].  

## 2012-06-20 NOTE — Assessment & Plan Note (Signed)
Exam done Vaccines were reviewed and updated Labs ordered Pt ed material was given 

## 2012-06-20 NOTE — Assessment & Plan Note (Signed)
She has adequate BP control She will improve her lifestyle modifications

## 2012-06-20 NOTE — Progress Notes (Signed)
  Subjective:    Patient ID: Diana Reeves, female    DOB: [birth year], 62 y.o.   MRN: [id number]  Hip Pain  The incident occurred more than 1 week ago. There was no injury mechanism. The pain is present in the right hip. The quality of the pain is described as aching. The pain is at a severity of 2/10. The pain is mild. The pain has been intermittent since onset. Pertinent negatives include no inability to bear weight, loss of motion, loss of sensation, muscle weakness, numbness or tingling. Nothing aggravates the symptoms. She has tried NSAIDs for the symptoms. The treatment provided moderate relief.      Review of Systems  Constitutional: Positive for unexpected weight change (weight gain). Negative for fever, chills, diaphoresis, activity change, appetite change and fatigue.  HENT: Negative.   Eyes: Negative.   Respiratory: Negative.  Negative for apnea, cough, choking, chest tightness, shortness of breath, wheezing and stridor.   Cardiovascular: Negative.  Negative for chest pain, palpitations and leg swelling.  Gastrointestinal: Negative.  Negative for nausea, vomiting, abdominal pain, diarrhea and constipation.  Endocrine: Negative.   Genitourinary: Negative.   Musculoskeletal: Negative.  Negative for myalgias, back pain, joint swelling and gait problem.  Skin: Negative.   Allergic/Immunologic: Negative.   Neurological: Negative.  Negative for dizziness, tingling, tremors, seizures, syncope, speech difficulty, weakness, light-headedness, numbness and headaches.  Hematological: Negative.  Negative for adenopathy. Does not bruise/bleed easily.  Psychiatric/Behavioral: Positive for sleep disturbance (dfa). Negative for suicidal ideas, hallucinations, behavioral problems, confusion, self-injury, dysphoric mood, decreased concentration and agitation. The patient is not nervous/anxious and is not hyperactive.        Objective:   Physical Exam  Vitals reviewed. Constitutional: She is  oriented to person, place, and time. She appears well-developed and well-nourished. No distress.  HENT:  Head: Normocephalic and atraumatic.  Mouth/Throat: Oropharynx is clear and moist. No oropharyngeal exudate.  Eyes: Conjunctivae are normal. Right eye exhibits no discharge. Left eye exhibits no discharge. No scleral icterus.  Neck: Normal range of motion. Neck supple. No JVD present. No tracheal deviation present. No thyromegaly present.  Cardiovascular: Normal rate, regular rhythm, normal heart sounds and intact distal pulses.  Exam reveals no gallop and no friction rub.   No murmur heard. Pulmonary/Chest: Effort normal and breath sounds normal. No stridor. No respiratory distress. She has no wheezes. She has no rales. She exhibits no tenderness.  Abdominal: Soft. Bowel sounds are normal. She exhibits no distension and no mass. There is no tenderness. There is no rebound and no guarding.  Musculoskeletal: Normal range of motion. She exhibits no edema and no tenderness.       Right hip: She exhibits tenderness (over the GT). She exhibits normal range of motion, normal strength, no bony tenderness, no swelling, no crepitus, no deformity and no laceration.  Lymphadenopathy:    She has no cervical adenopathy.  Neurological: She is oriented to person, place, and time.  Skin: Skin is warm and dry. No rash noted. She is not diaphoretic. No erythema. No pallor.  Psychiatric: She has a normal mood and affect. Her behavior is normal. Judgment and thought content normal.          Assessment & Plan:

## 2012-06-20 NOTE — Assessment & Plan Note (Signed)
Continue cymbalta and ambien (as needed)

## 2012-06-20 NOTE — Assessment & Plan Note (Signed)
I have asked her to try PT and she will continue nsaids

## 2012-06-20 NOTE — Addendum Note (Signed)
Addended by: Estell Harpin T on: 06/20/2012 09:29 AM   Modules accepted: Orders

## 2012-06-24 ENCOUNTER — Encounter: Payer: Self-pay | Admitting: Internal Medicine

## 2012-06-24 LAB — VITAMIN D 1,25 DIHYDROXY: Vitamin D2 1, 25 (OH)2: 44 pg/mL

## 2012-09-27 ENCOUNTER — Encounter: Payer: Self-pay | Admitting: Internal Medicine

## 2012-09-27 ENCOUNTER — Encounter: Payer: Self-pay | Admitting: Gastroenterology

## 2012-09-28 LAB — HM DIABETES EYE EXAM

## 2012-10-03 ENCOUNTER — Telehealth: Payer: Self-pay | Admitting: Internal Medicine

## 2012-10-03 ENCOUNTER — Other Ambulatory Visit: Payer: Self-pay | Admitting: Internal Medicine

## 2012-10-03 MED ORDER — DEXAMETHASONE 1.5 MG PO KIT: 1.0000 | PACK | Freq: Every day | ORAL | Status: DC

## 2012-10-03 NOTE — Telephone Encounter (Signed)
Pt is on vacation at [facility] [place], [place].  She has a bad case of poison ivy.  She would like to have the 4 day dose of prednisone called into the [facility] there.  The phone number is [phone].

## 2012-10-03 NOTE — Progress Notes (Signed)
done

## 2012-10-03 NOTE — Telephone Encounter (Signed)
done

## 2012-10-03 NOTE — Telephone Encounter (Signed)
Pt is aware.  

## 2012-10-10 ENCOUNTER — Encounter: Payer: Self-pay | Admitting: Internal Medicine

## 2012-10-10 ENCOUNTER — Other Ambulatory Visit (INDEPENDENT_AMBULATORY_CARE_PROVIDER_SITE_OTHER): Payer: BC Managed Care – PPO

## 2012-10-10 ENCOUNTER — Ambulatory Visit (INDEPENDENT_AMBULATORY_CARE_PROVIDER_SITE_OTHER): Payer: BC Managed Care – PPO | Admitting: Internal Medicine

## 2012-10-10 VITALS — BP 132/82 | HR 75 | Temp 98.4°F | Resp 16 | Wt 210.0 lb

## 2012-10-10 DIAGNOSIS — I10 Essential (primary) hypertension: Secondary | ICD-10-CM

## 2012-10-10 DIAGNOSIS — R7309 Other abnormal glucose: Secondary | ICD-10-CM

## 2012-10-10 DIAGNOSIS — E669 Obesity, unspecified: Secondary | ICD-10-CM | POA: Insufficient documentation

## 2012-10-10 DIAGNOSIS — IMO0001 Reserved for inherently not codable concepts without codable children: Secondary | ICD-10-CM

## 2012-10-10 DIAGNOSIS — F341 Dysthymic disorder: Secondary | ICD-10-CM

## 2012-10-10 DIAGNOSIS — E118 Type 2 diabetes mellitus with unspecified complications: Secondary | ICD-10-CM | POA: Insufficient documentation

## 2012-10-10 DIAGNOSIS — F418 Other specified anxiety disorders: Secondary | ICD-10-CM

## 2012-10-10 LAB — BASIC METABOLIC PANEL
BUN: 20 mg/dL (ref 6–23)
Creatinine, Ser: 0.8 mg/dL (ref 0.4–1.2)
GFR: 74.52 mL/min (ref >60.00)

## 2012-10-10 LAB — HM DIABETES FOOT EXAM

## 2012-10-10 LAB — HEMOGLOBIN A1C: Hgb A1c MFr Bld: 7.3 % — ABNORMAL HIGH (ref 4.6–6.5)

## 2012-10-10 MED ORDER — CLONAZEPAM 0.5 MG PO TABS: 0.5000 mg | ORAL_TABLET | Freq: Three times a day (TID) | ORAL | Status: DC | PRN

## 2012-10-10 NOTE — Assessment & Plan Note (Signed)
This is a new diagnosis. I have asked her to return to start treatment.

## 2012-10-10 NOTE — Assessment & Plan Note (Signed)
Her BP is well controlled 

## 2012-10-10 NOTE — Patient Instructions (Signed)

## 2012-10-10 NOTE — Assessment & Plan Note (Signed)
It appears that she has had a setback due to the recent steroid therapy. I reassured her that this should not last much longer. For now, she will try klonopin for the anxiety and she will continue cymbalta.

## 2012-10-10 NOTE — Assessment & Plan Note (Signed)
She will work on her lifestyle modifications to lose weight

## 2012-10-10 NOTE — Progress Notes (Signed)
Subjective:    Patient ID: Diana Reeves, female    DOB: [birth year], 62 y.o.   MRN: [id number]  Anxiety Presents for follow-up visit. Symptoms include depressed mood, excessive worry, insomnia, irritability, malaise, muscle tension, nervous/anxious behavior, panic and restlessness. Patient reports no chest pain, compulsions, confusion, decreased concentration, dizziness, dry mouth, feeling of choking, hyperventilation, nausea, obsessions, palpitations, shortness of breath or suicidal ideas. Symptoms occur most days. The severity of symptoms is severe. The quality of sleep is fair. Nighttime awakenings: occasional.   Compliance with medications is 76-100%.      Review of Systems  Constitutional: Positive for irritability, fatigue and unexpected weight change (some weight gain). Negative for fever, chills, diaphoresis, activity change and appetite change.  HENT: Negative.   Eyes: Negative.   Respiratory: Negative.  Negative for cough, choking, chest tightness and shortness of breath.   Cardiovascular: Negative.  Negative for chest pain, palpitations and leg swelling.  Gastrointestinal: Negative.  Negative for nausea, vomiting, abdominal pain, diarrhea, constipation and blood in stool.  Endocrine: Negative.  Negative for polydipsia, polyphagia and polyuria.  Genitourinary: Negative.   Musculoskeletal: Negative.  Negative for myalgias, back pain, joint swelling, arthralgias and gait problem.  Skin: Negative.  Negative for color change, pallor, rash and wound.  Allergic/Immunologic: Negative.   Neurological: Negative for dizziness, tremors, speech difficulty, weakness, light-headedness, numbness and headaches.  Hematological: Negative.  Negative for adenopathy. Does not bruise/bleed easily.  Psychiatric/Behavioral: Positive for dysphoric mood. Negative for suicidal ideas, hallucinations, behavioral problems, confusion, sleep disturbance, self-injury, decreased concentration and agitation. The  patient is nervous/anxious and has insomnia. The patient is not hyperactive.        Objective:   Physical Exam  Vitals reviewed. Constitutional: She is oriented to person, place, and time. She appears well-developed and well-nourished. No distress.  HENT:  Head: Normocephalic and atraumatic.  Mouth/Throat: Oropharynx is clear and moist. No oropharyngeal exudate.  Eyes: Conjunctivae are normal. Right eye exhibits no discharge. Left eye exhibits no discharge. No scleral icterus.  Neck: Normal range of motion. Neck supple. No JVD present. No tracheal deviation present. No thyromegaly present.  Cardiovascular: Normal rate, regular rhythm, normal heart sounds and intact distal pulses.  Exam reveals no gallop and no friction rub.   No murmur heard. Pulmonary/Chest: Effort normal and breath sounds normal. No stridor. No respiratory distress. She has no wheezes. She has no rales. She exhibits no tenderness.  Abdominal: Soft. Bowel sounds are normal. She exhibits no distension and no mass. There is no tenderness. There is no rebound and no guarding.  Musculoskeletal: Normal range of motion. She exhibits no edema and no tenderness.  Lymphadenopathy:    She has no cervical adenopathy.  Neurological: She is oriented to person, place, and time.  Skin: Skin is warm and dry. No rash noted. She is not diaphoretic. No erythema. No pallor.  Psychiatric: Her behavior is normal. Judgment and thought content normal. Her mood appears anxious. Her affect is not angry, not blunt, not labile and not inappropriate. Her speech is not rapid and/or pressured, not delayed, not tangential and not slurred. She is not agitated. Cognition and memory are normal. She exhibits a depressed mood. She is communicative.  She is tearful and emotionally distressed    Lab Results  Component Value Date   WBC 6.3 06/20/2012   HGB 13.5 06/20/2012   HCT 39.3 06/20/2012   PLT 261.0 06/20/2012   GLUCOSE 121* 06/20/2012   CHOL 163 06/20/2012    TRIG 123.0 06/20/2012  HDL 38.30* 06/20/2012   LDLCALC 100* 06/20/2012   ALT 35 06/20/2012   AST 34 06/20/2012   NA 141 06/20/2012   K 4.2 06/20/2012   CL 107 06/20/2012   CREATININE 0.8 06/20/2012   BUN 12 06/20/2012   CO2 27 06/20/2012   TSH 1.01 06/20/2012        Assessment & Plan:

## 2012-10-14 ENCOUNTER — Telehealth: Payer: Self-pay | Admitting: Internal Medicine

## 2012-10-14 MED ORDER — CLOBETASOL PROPIONATE 0.05 % EX OINT: TOPICAL_OINTMENT | Freq: Two times a day (BID) | CUTANEOUS | Status: DC

## 2012-10-14 NOTE — Telephone Encounter (Signed)
Will use an ointment

## 2012-10-14 NOTE — Telephone Encounter (Signed)
The pt called and is hoping to get a refill of her prednisone patch due to her rash being back.    Call back - [phone]

## 2012-10-17 MED ORDER — CLOBETASOL PROPIONATE 0.05 % EX OINT: TOPICAL_OINTMENT | Freq: Two times a day (BID) | CUTANEOUS | Status: DC

## 2012-10-17 NOTE — Telephone Encounter (Signed)
Pt.notified

## 2012-10-18 HISTORY — PX: EYE SURGERY: SHX253

## 2012-12-02 ENCOUNTER — Encounter: Payer: Self-pay | Admitting: Gastroenterology

## 2012-12-20 ENCOUNTER — Encounter: Payer: Self-pay | Admitting: Women's Health

## 2012-12-20 ENCOUNTER — Ambulatory Visit (INDEPENDENT_AMBULATORY_CARE_PROVIDER_SITE_OTHER): Payer: BC Managed Care – PPO | Admitting: Women's Health

## 2012-12-20 DIAGNOSIS — L293 Anogenital pruritus, unspecified: Secondary | ICD-10-CM

## 2012-12-20 DIAGNOSIS — R350 Frequency of micturition: Secondary | ICD-10-CM

## 2012-12-20 DIAGNOSIS — N898 Other specified noninflammatory disorders of vagina: Secondary | ICD-10-CM

## 2012-12-20 DIAGNOSIS — B373 Candidiasis of vulva and vagina: Secondary | ICD-10-CM

## 2012-12-20 DIAGNOSIS — N952 Postmenopausal atrophic vaginitis: Secondary | ICD-10-CM

## 2012-12-20 LAB — URINALYSIS W MICROSCOPIC + REFLEX CULTURE
Bilirubin Urine: NEGATIVE
Glucose, UA: NEGATIVE mg/dL
Hgb urine dipstick: NEGATIVE
Leukocytes, UA: NEGATIVE
pH: 5.5 (ref 5.0–8.0)

## 2012-12-20 LAB — WET PREP FOR TRICH, YEAST, CLUE: Clue Cells Wet Prep HPF POC: NONE SEEN

## 2012-12-20 MED ORDER — FLUCONAZOLE 150 MG PO TABS: 150.0000 mg | ORAL_TABLET | Freq: Once | ORAL | Status: DC

## 2012-12-20 MED ORDER — ESTRADIOL 10 MCG VA TABS: 1.0000 | ORAL_TABLET | VAGINAL | Status: DC

## 2012-12-20 NOTE — Progress Notes (Signed)
Patient ID: Diana Reeves, female   DOB: [birth year], 62 y.o.   MRN: [id number]  Presents with vaginal burning x 4 days.  History of recurrent UTI/ bacterial/ yeast infections in past.  Has vaginal dryness, recently tried Estring without relief.  Vagifem has given relief in the past, wants to use it again.  Denies vaginal discharge, odor, or urinary symptoms.  Exam: External genitalia WNL.  Speculum exam: Estring in place, removed and discarded per patient's request, vaginal walls erythematous and atrophic.  Wet  Prep positive for yeast,   Postmenopausal vaginal dryness/ atrophy Yeast infection  Plan:  Fluconazole 150 mg po once, with Vagifem samples and prescription given, reviewed minimal systemic absorption .  Yeast prevention discussed. Call if no relief.

## 2012-12-20 NOTE — Patient Instructions (Addendum)
Monilial Vaginitis  Vaginitis in a soreness, swelling and redness (inflammation) of the vagina and vulva. Monilial vaginitis is not a sexually transmitted infection.  CAUSES   Yeast vaginitis is caused by yeast (candida) that is normally found in your vagina. With a yeast infection, the candida has overgrown in number to a point that upsets the chemical balance.  SYMPTOMS   · White, thick vaginal discharge.  · Swelling, itching, redness and irritation of the vagina and possibly the lips of the vagina (vulva).  · Burning or painful urination.  · Painful intercourse.  DIAGNOSIS   Things that may contribute to monilial vaginitis are:  · Postmenopausal and virginal states.  · Pregnancy.  · Infections.  · Being tired, sick or stressed, especially if you had monilial vaginitis in the past.  · Diabetes. Good control will help lower the chance.  · Birth control pills.  · Tight fitting garments.  · Using bubble bath, feminine sprays, douches or deodorant tampons.  · Taking certain medications that kill germs (antibiotics).  · Sporadic recurrence can occur if you become ill.  TREATMENT   Your caregiver will give you medication.  · There are several kinds of anti monilial vaginal creams and suppositories specific for monilial vaginitis. For recurrent yeast infections, use a suppository or cream in the vagina 2 times a week, or as directed.  · Anti-monilial or steroid cream for the itching or irritation of the vulva may also be used. Get your caregiver's permission.  · Painting the vagina with methylene blue solution may help if the monilial cream does not work.  · Eating yogurt may help prevent monilial vaginitis.  HOME CARE INSTRUCTIONS   · Finish all medication as prescribed.  · Do not have sex until treatment is completed or after your caregiver tells you it is okay.  · Take warm sitz baths.  · Do not douche.  · Do not use tampons, especially scented ones.  · Wear cotton underwear.  · Avoid tight pants and panty  hose.  · Tell your sexual partner that you have a yeast infection. They should go to their caregiver if they have symptoms such as mild rash or itching.  · Your sexual partner should be treated as well if your infection is difficult to eliminate.  · Practice safer sex. Use condoms.  · Some vaginal medications cause latex condoms to fail. Vaginal medications that harm condoms are:  · Cleocin cream.  · Butoconazole (Femstat®).  · Terconazole (Terazol®) vaginal suppository.  · Miconazole (Monistat®) (may be purchased over the counter).  SEEK MEDICAL CARE IF:   · You have a temperature by mouth above 102° F (38.9° C).  · The infection is getting worse after 2 days of treatment.  · The infection is not getting better after 3 days of treatment.  · You develop blisters in or around your vagina.  · You develop vaginal bleeding, and it is not your menstrual period.  · You have pain when you urinate.  · You develop intestinal problems.  · You have pain with sexual intercourse.  Document Released: 12/10/2004 Document Revised: 05/25/2011 Document Reviewed: 08/24/2008  ExitCare® Patient Information ©2014 [employer].

## 2012-12-22 ENCOUNTER — Ambulatory Visit (AMBULATORY_SURGERY_CENTER): Payer: Self-pay | Admitting: *Deleted

## 2012-12-22 VITALS — Ht 66.0 in | Wt 215.0 lb

## 2012-12-22 DIAGNOSIS — Z860100 Personal history of colon polyps, unspecified: Secondary | ICD-10-CM

## 2012-12-22 MED ORDER — MOVIPREP 100 G PO SOLR: 1.0000 | Freq: Once | ORAL | Status: DC

## 2012-12-22 NOTE — Progress Notes (Signed)
No egg or soy allergy. No anesthesia problems.  

## 2012-12-23 ENCOUNTER — Encounter: Payer: Self-pay | Admitting: Gastroenterology

## 2013-01-02 ENCOUNTER — Ambulatory Visit (AMBULATORY_SURGERY_CENTER): Payer: BC Managed Care – PPO | Admitting: Gastroenterology

## 2013-01-02 ENCOUNTER — Encounter: Payer: Self-pay | Admitting: Gastroenterology

## 2013-01-02 VITALS — BP 122/59 | HR 72 | Temp 98.0°F | Resp 23 | Ht 66.0 in | Wt 215.0 lb

## 2013-01-02 DIAGNOSIS — Z860100 Personal history of colon polyps, unspecified: Secondary | ICD-10-CM

## 2013-01-02 DIAGNOSIS — K5730 Diverticulosis of large intestine without perforation or abscess without bleeding: Secondary | ICD-10-CM

## 2013-01-02 MED ORDER — SODIUM CHLORIDE 0.9 % IV SOLN: 500.0000 mL | INTRAVENOUS | Status: DC

## 2013-01-02 NOTE — Progress Notes (Signed)
Procedure ends, to recovery, report given and VSS. 

## 2013-01-02 NOTE — Progress Notes (Signed)
Patient did not have preoperative order for IV antibiotic SSI prophylaxis. (G8918)Patient did not experience any of the following events: a burn prior to discharge; a fall within the facility; wrong site/side/patient/procedure/implant event; or a hospital transfer or hospital admission upon discharge from the facility. (G8907)Patient did not experience any of the following events: a burn prior to discharge; a fall within the facility; wrong site/side/patient/procedure/implant event; or a hospital transfer or hospital admission upon discharge from the facility. [phone])

## 2013-01-02 NOTE — Progress Notes (Signed)
Patient denies any allergies to eggs or soy. Patient denies any problems with anesthesia.

## 2013-01-02 NOTE — Patient Instructions (Signed)
YOU HAD AN ENDOSCOPIC PROCEDURE TODAY AT [facility]: Refer to the procedure report that was given to you for any specific questions about what was found during the examination.  If the procedure report does not answer your questions, please call your gastroenterologist to clarify.  If you requested that your care partner not be given the details of your procedure findings, then the procedure report has been included in a sealed envelope for you to review at your convenience later.  YOU SHOULD EXPECT: Some feelings of bloating in the abdomen. Passage of more gas than usual.  Walking can help get rid of the air that was put into your GI tract during the procedure and reduce the bloating. If you had a lower endoscopy (such as a colonoscopy or flexible sigmoidoscopy) you may notice spotting of blood in your stool or on the toilet paper. If you underwent a bowel prep for your procedure, then you may not have a normal bowel movement for a few days.  DIET: Your first meal following the procedure should be a light meal and then it is ok to progress to your normal diet.  A half-sandwich or bowl of soup is an example of a good first meal.  Heavy or fried foods are harder to digest and may make you feel nauseous or bloated.  Likewise meals heavy in dairy and vegetables can cause extra gas to form and this can also increase the bloating.  Drink plenty of fluids but you should avoid alcoholic beverages for 24 hours.  ACTIVITY: Your care partner should take you home directly after the procedure.  You should plan to take it easy, moving slowly for the rest of the day.  You can resume normal activity the day after the procedure however you should NOT DRIVE or use heavy machinery for 24 hours (because of the sedation medicines used during the test).    SYMPTOMS TO REPORT IMMEDIATELY: A gastroenterologist can be reached at any hour.  During normal business hours, 8:30 AM to 5:00 PM Monday through Friday,  call [phone].  After hours and on weekends, please call the GI answering service at [phone] who will take a message and have the physician on call contact you.   Following lower endoscopy (colonoscopy or flexible sigmoidoscopy):  Excessive amounts of blood in the stool  Significant tenderness or worsening of abdominal pains  Swelling of the abdomen that is new, acute  Fever of 100F or higher    FOLLOW UP: If any biopsies were taken you will be contacted by phone or by letter within the next 1-3 weeks.  Call your gastroenterologist if you have not heard about the biopsies in 3 weeks.  Our staff will call the home number listed on your records the next business day following your procedure to check on you and address any questions or concerns that you may have at that time regarding the information given to you following your procedure. This is a courtesy call and so if there is no answer at the home number and we have not heard from you through the emergency physician on call, we will assume that you have returned to your regular daily activities without incident.  SIGNATURES/CONFIDENTIALITY: You and/or your care partner have signed paperwork which will be entered into your electronic medical record.  These signatures attest to the fact that that the information above on your After Visit Summary has been reviewed and is understood.  Full responsibility of the confidentiality  of this discharge information lies with you and/or your care-partner.    Information on diverticulosis given to you today

## 2013-01-02 NOTE — Op Note (Signed)
[place]  [address] [place], [id number]   COLONOSCOPY PROCEDURE REPORT  PATIENT: Diana Reeves, Diana Reeves  MR#: [id number] BIRTHDATE: 20-Nov-1950 , 67  yrs. old GENDER: Female ENDOSCOPIST: Sable Feil, MD, [facility] REFERRED BY: PROCEDURE DATE:  01/02/2013 PROCEDURE:   Colonoscopy, surveillance First Screening Colonoscopy - Avg.  risk and is 50 yrs.  old or older - No.  Prior Negative Screening - Now for repeat screening. N/A  History of Adenoma - Now for follow-up colonoscopy & has been > or = to 3 yrs.  Yes hx of adenoma.  Has been 3 or more years since last colonoscopy. ASA CLASS:   Class II INDICATIONS:Patient's personal history of colon polyps. MEDICATIONS: Fentanyl-Quick Pick and propofol (Diprivan) 279m IV  DESCRIPTION OF PROCEDURE:   After the risks benefits and alternatives of the procedure were thoroughly explained, informed consent was obtained.  A digital rectal exam revealed no abnormalities of the rectum.   The LB [id number] endoscope was introduced through the anus and advanced to the cecum, which was identified by both the appendix and ileocecal valve. No adverse events experienced.   The quality of the prep was excellent, using MoviPrep  The instrument was then slowly withdrawn as the colon was fully examined.      COLON FINDINGS: Mild diverticulosis was noted in the sigmoid colon and descending colon.   The colon was otherwise normal.  There was no diverticulosis, inflammation, polyps or cancers unless previously stated.  Retroflexed views revealed no abnormalities. The time to cecum=2 minutes 44 seconds.  Withdrawal time=6 minutes 11 seconds.  The scope was withdrawn and the procedure completed. COMPLICATIONS: There were no complications.  ENDOSCOPIC IMPRESSION: 1.   Mild diverticulosis was noted in the sigmoid colon and descending colon 2.   The colon was otherwise normal ,no polyps noted,  RECOMMENDATIONS: 1.  Continue  current medications 2.  Repeat Colonoscopy in 5 years.per hx. of multiple adenomas   eSigned:  DSable Feil MD, [facility]10/20/2014 9:49 AM   cc: TJanith Lima MD

## 2013-01-03 ENCOUNTER — Telehealth: Payer: Self-pay | Admitting: *Deleted

## 2013-01-03 NOTE — Telephone Encounter (Signed)
  Follow up Call-  Call back number 01/02/2013  Post procedure Call Back phone  # [phone]  Permission to leave phone message Yes     Patient questions:  Do you have a fever, pain , or abdominal swelling? no Pain Score  0 *  Have you tolerated food without any problems? yes  Have you been able to return to your normal activities? yes  Do you have any questions about your discharge instructions: Diet   no Medications  no Follow up visit  no  Do you have questions or concerns about your Care? no  Actions: * If pain score is 4 or above: No action needed, pain <4.

## 2013-02-23 LAB — HM MAMMOGRAPHY: HM Mammogram: NORMAL

## 2013-05-17 ENCOUNTER — Other Ambulatory Visit: Payer: Self-pay | Admitting: Internal Medicine

## 2013-06-17 ENCOUNTER — Other Ambulatory Visit: Payer: Self-pay | Admitting: Internal Medicine

## 2013-06-28 ENCOUNTER — Encounter: Payer: Self-pay | Admitting: Women's Health

## 2013-06-28 ENCOUNTER — Ambulatory Visit (INDEPENDENT_AMBULATORY_CARE_PROVIDER_SITE_OTHER): Payer: BC Managed Care – PPO | Admitting: Women's Health

## 2013-06-28 DIAGNOSIS — N898 Other specified noninflammatory disorders of vagina: Secondary | ICD-10-CM

## 2013-06-28 DIAGNOSIS — N899 Noninflammatory disorder of vagina, unspecified: Secondary | ICD-10-CM

## 2013-06-28 LAB — WET PREP FOR TRICH, YEAST, CLUE
CLUE CELLS WET PREP: NONE SEEN
TRICH WET PREP: NONE SEEN
Yeast Wet Prep HPF POC: NONE SEEN

## 2013-06-28 MED ORDER — CLOBETASOL PROPIONATE 0.05 % EX OINT: TOPICAL_OINTMENT | Freq: Two times a day (BID) | CUTANEOUS | Status: DC

## 2013-06-28 NOTE — Patient Instructions (Signed)
Rectocele/Enterocele, Care After A woman's birth canal (vagina) can become weak or stretched. This can be caused by childbirth, heavy lifting, lasting (chronic) constipation, aging, or pelvic surgery. When the vagina is weak and stretched, parts of the intestine can bulge into the vagina by pushing against the vaginal walls. A rectocele is when the very end of the large intestine (rectum) causes the bulge. An enterocele is when the small intestine causes the bulge. Surgery to fix this problem is usually done through the vagina. If you just had this surgery, you were probably given a drug to make you sleep (general anesthetic) or a drug that numbs you from the waist down (spinal/epidural). Here is what happened:  The small intestine or rectum was pushed back to its normal place.  The vaginal wall was made stronger. Sometimes this is done with stitches or a mesh-like material. [place]  Some women go home the same day as their surgery. Others stay in the hospital for a few days. This depends on the size and type of repair.  Pain and Medications  Some pain is normal after this surgery. Only take pain medicine your surgeon prescribed. Follow the directions carefully.  Do not take aspirin. It can cause bleeding.  Do not drink alcohol while taking pain medication.  You may be given a medicine (antibiotic) that kills germs. Follow the directions carefully.  Take warm sitz baths 2 times a day to control discomfort and reduce any swelling. Take sitz baths with your caregiver's permission. Diet  Go back to your normal eating as directed by your caregiver.  Drink a lot of fluids. Drink at least 6 glasses of water every day. Activity  Move around and walk as much as possible. This can keep blood clots from forming in your legs.  Do not climb stairs until your caregiver says it is okay.  Do not lift objects 5 pounds (2.3 kg) or heavier. Do not bend or strain for 6 to 8 weeks.  Do  not drive until after you stop taking pain medicine and your caregiver says it is okay.  Your return to work will depend on the type of work you do. Ask your caregiver what is best for you.  Ask your caregiver when you can resume sexual activity. Most women can start having sex in about 6 weeks after their surgery.  Get plenty of rest during the day and sleep at night.  Have someone help you with your household chores and activities for 3 to 4 weeks. Other Precautions  You may have some discharge from the vagina for a few weeks after the surgery. It may have small amounts of blood in it. This is normal. If you have questions, ask your caregiver.  Do not use tampons or douche.  You should be able to take a shower a day after your surgery. Do not take a tub bath for at least a week.  Take it easy for awhile. You should feel much better in 2 to 3 weeks. It may take up to 6 weeks to feel completely normal.  Keep all follow-up appointments.  Take your temperature twice a day and write it down.  Make sure your family understands everything about your surgery and recovery. SEEK MEDICAL CARE IF:   You have any questions about your medication, or you need stronger pain medication.  Pain continues, even after taking pain medication.  You become constipated.  You have an oral temperature above 102 F (38.9 C).  You develop swelling and redness in the surgery area.  You become dizzy or lightheaded.  You feel sick to your stomach (nauseous), throw up (vomit), or have diarrhea.  You develop a rash.  You have a reaction to your medications. SEEK IMMEDIATE MEDICAL CARE IF:   Pain gets worse.  You have new bleeding from your vagina.  Discharge from the vagina becomes heavy, or it has a bad smell.  You have an oral temperature above 102 F (38.9 C), not controlled by medicine.  You develop belly (abdominal) pain.  You develop chest pain.  You develop shortness of  breath.  You pass out (faint).  You develop pain, swelling, or redness in the leg.  You have pain or burning with urination.  You have bloody urine or cannot urinate. MAKE SURE YOU:   Understand these instructions.  Will watch your condition.  Will get help right away if you are not doing well or get worse. Document Released: 05/27/2009 Document Revised: 12/21/2012 Document Reviewed: 05/27/2009 [facility] Patient Information 2014 [place], [place].

## 2013-06-28 NOTE — Progress Notes (Signed)
Patient ID: Diana Reeves, female   DOB: 04/12/1950, 63 y.o.   MRN: [id number] Presents with chronic vaginal irritation and burning, worse in the past week. States had increased pressure as the day goes on with a burning sensation that is bothersome. 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 Has rectocele and vaginal dryness, has tried Estring without relief, currently using Vagifem twice per week and aquaphor up to three times a day. History of recurrent UTI/ bacterial/ yeast infections. Denies vaginal discharge, odor, constipation, changes in bowel elimination or urinary symptoms. Not sexually active. Hysterectomy with bladder sling for prolapse..  Exam: Appears well, external genitalia +1 rectocele. Speculum exam no visible discharge or erythema. Wet prep negative. Bimanual rectocele reducible. Rectal exam confirmatory.  Symptomatic Rectocele  Plan: Consult with Dr. Phineas Real to discuss possible surgery or treatment.

## 2013-07-13 ENCOUNTER — Encounter: Payer: Self-pay | Admitting: Gynecology

## 2013-07-13 ENCOUNTER — Ambulatory Visit (INDEPENDENT_AMBULATORY_CARE_PROVIDER_SITE_OTHER): Payer: BC Managed Care – PPO | Admitting: Gynecology

## 2013-07-13 DIAGNOSIS — N816 Rectocele: Secondary | ICD-10-CM

## 2013-07-13 DIAGNOSIS — N952 Postmenopausal atrophic vaginitis: Secondary | ICD-10-CM

## 2013-07-13 MED ORDER — ESTROGENS, CONJUGATED 0.625 MG/GM VA CREA: TOPICAL_CREAM | VAGINAL | Status: DC

## 2013-07-13 NOTE — Progress Notes (Signed)
Diana Reeves 12/04/1950 [id number]        63 y.o.  G3P3003 presents in referral from [place] complaining of vaginal irritation and rectocele. Patient had an abdominal hysterectomy LSO Burch suspension and questionable posterior repair. Operative report is not in the electronic medical record. Has had a chronic issue is vaginal dryness and has used a variety of products over the years to include vaginal estrogen cream, Estring and now Vagifem twice weekly. Is having a lot of vaginal irritation for which she is using vaginal wipes also throughout the day. Does have a history of recurrent vaginal infections also. Notes at the end of the day she also feels some vaginal pressure but no overt stool trapping. No urinary or fecal incontinence.  Past medical history,surgical history, problem list, medications, allergies, family history and social history were all reviewed and documented in the EPIC chart.  Directed ROS with pertinent positives and negatives documented in the history of present illness/assessment and plan.  Exam: [name] assistant General appearance  Normal External BUS vagina with atrophic changes. Bladder appears well supported. Cuff well supported. First to second degree rectocele noted to the level of the introitus. No evidence of enterocele. Rectovaginal exam confirms. No discharge or other evidence of infection.  Assessment/Plan:  63 y.o. [id number] with symptoms to suggest vaginal atrophy with irritation. Also some symptoms from her rectocele to include pressure at the end of the day. Will obtain operative report to better define the surgery she had previously. Recommended a more aggressive trial of estrogen to see if we can't alleviate the irritated symptoms. Recommended initially we'll start with Premarin vaginal cream one third applicator every other night or 2 months. Issues of absorption and systemic effects to include increased risk of stroke heart attack DVT possible breast cancer reviewed.  Alternatives to include increasing her Vagifem to 3-4 times weekly or Osphena. Mechanism of action with Osphena, risks/benefits reviewed. Patient would prefer the estrogen cream for now. Have asked her to come back in 2 months for reexamination and then we'll go from there. If she continues to have rectocele symptoms then we may proceed with a posterior colporrhaphy.   Note: This document was prepared with digital dictation and possible smart phrase technology. Any transcriptional errors that result from this process are unintentional.   Anastasio Auerbach MD, 4:51 PM 07/13/2013

## 2013-07-13 NOTE — Patient Instructions (Signed)
Use the Premarin vaginal cream one third applicator every other night Followup in 2 months for reexamination

## 2013-08-30 ENCOUNTER — Other Ambulatory Visit: Payer: Self-pay | Admitting: Dermatology

## 2013-09-12 ENCOUNTER — Telehealth: Payer: Self-pay | Admitting: *Deleted

## 2013-09-12 ENCOUNTER — Ambulatory Visit: Payer: BC Managed Care – PPO | Admitting: Gynecology

## 2013-09-12 DIAGNOSIS — E1165 Type 2 diabetes mellitus with hyperglycemia: Principal | ICD-10-CM

## 2013-09-12 DIAGNOSIS — IMO0001 Reserved for inherently not codable concepts without codable children: Secondary | ICD-10-CM

## 2013-09-12 NOTE — Telephone Encounter (Signed)
Lipid panel ordered.

## 2013-09-12 NOTE — Telephone Encounter (Signed)
Left message on machine for patient to return our call.  Patient will need to schedule a follow up diabetes office visit and labs.

## 2013-09-14 ENCOUNTER — Encounter: Payer: Self-pay | Admitting: Gynecology

## 2013-09-14 ENCOUNTER — Ambulatory Visit (INDEPENDENT_AMBULATORY_CARE_PROVIDER_SITE_OTHER): Payer: BC Managed Care – PPO | Admitting: Gynecology

## 2013-09-14 DIAGNOSIS — B3731 Acute candidiasis of vulva and vagina: Secondary | ICD-10-CM

## 2013-09-14 DIAGNOSIS — N952 Postmenopausal atrophic vaginitis: Secondary | ICD-10-CM

## 2013-09-14 DIAGNOSIS — B373 Candidiasis of vulva and vagina: Secondary | ICD-10-CM

## 2013-09-14 DIAGNOSIS — N816 Rectocele: Secondary | ICD-10-CM

## 2013-09-14 LAB — WET PREP FOR TRICH, YEAST, CLUE
Clue Cells Wet Prep HPF POC: NONE SEEN
Trich, Wet Prep: NONE SEEN

## 2013-09-14 MED ORDER — ESTROGENS, CONJUGATED 0.625 MG/GM VA CREA: TOPICAL_CREAM | VAGINAL | Status: DC

## 2013-09-14 MED ORDER — FLUCONAZOLE 150 MG PO TABS: 150.0000 mg | ORAL_TABLET | Freq: Once | ORAL | Status: DC

## 2013-09-14 NOTE — Progress Notes (Addendum)
Diana Reeves 09-Sep-1950 [id number]        63 y.o.  G3P3003 presents in followup having recently been seen 07/13/2013 with atrophic vaginitis, irritation of symptoms and pressure symptoms from her rectocele. Had used Vagifem and Estring in the past. Was placed on Premarin vaginal cream 3 times weekly. Patient noticed dramatic improvement with resolution of her irritation. Her rectocele is no longer symptomatic. She does not little bit of itching and thinks she is getting an early yeast infection that she is prone to getting.  Past medical history,surgical history, problem list, medications, allergies, family history and social history were all reviewed and documented in the EPIC chart.  Directed ROS with pertinent positives and negatives documented in the history of present illness/assessment and plan.  Exam: Diana Reeves assistant General appearance:  Normal Abdomen soft nontender without masses guarding rebound Pelvic external BUS vagina with atrophic changes. First to second degree rectocele noted. Slight vaginal discharge. Bimanual without masses or tenderness.  Assessment/Plan:  63 y.o. G3P3003 with good response to vaginal estrogen cream. Patient will continue on this for now I refilled her Premarin x2 tubes. She is due for her annual exam she'll schedule that now. Wet prep did show some yeast and we'll cover her with Diflucan 150 mg x1 dose. #4 pills given as she is prone to yeast infections during the summer.   Note: This document was prepared with digital dictation and possible smart phrase technology. Any transcriptional errors that result from this process are unintentional.   Anastasio Auerbach MD, 3:05 PM 09/14/2013

## 2013-09-14 NOTE — Patient Instructions (Signed)
Take Diflucan one pill as needed for yeast. Continue on the Premarin 3 times weekly. Followup for annual exam sometime in the next month or so.

## 2013-09-14 NOTE — Addendum Note (Signed)
Addended by: Nelva Nay on: 09/14/2013 03:10 PM   Modules accepted: Orders

## 2013-10-05 ENCOUNTER — Encounter: Payer: Self-pay | Admitting: Obstetrics and Gynecology

## 2013-10-20 ENCOUNTER — Other Ambulatory Visit (HOSPITAL_COMMUNITY)
Admission: RE | Admit: 2013-10-20 | Discharge: 2013-10-20 | Disposition: A | Discharge status: Home / Self Care | Payer: BC Managed Care – PPO | Source: Ambulatory Visit | Attending: Gynecology | Admitting: Gynecology

## 2013-10-20 ENCOUNTER — Encounter: Payer: Self-pay | Admitting: Gynecology

## 2013-10-20 ENCOUNTER — Ambulatory Visit (INDEPENDENT_AMBULATORY_CARE_PROVIDER_SITE_OTHER): Payer: BC Managed Care – PPO | Admitting: Gynecology

## 2013-10-20 VITALS — BP 124/80 | Ht 65.5 in | Wt 203.0 lb

## 2013-10-20 DIAGNOSIS — E559 Vitamin D deficiency, unspecified: Secondary | ICD-10-CM

## 2013-10-20 DIAGNOSIS — B373 Candidiasis of vulva and vagina: Secondary | ICD-10-CM

## 2013-10-20 DIAGNOSIS — M810 Age-related osteoporosis without current pathological fracture: Secondary | ICD-10-CM

## 2013-10-20 DIAGNOSIS — Z01419 Encounter for gynecological examination (general) (routine) without abnormal findings: Secondary | ICD-10-CM

## 2013-10-20 DIAGNOSIS — B3731 Acute candidiasis of vulva and vagina: Secondary | ICD-10-CM

## 2013-10-20 DIAGNOSIS — N952 Postmenopausal atrophic vaginitis: Secondary | ICD-10-CM

## 2013-10-20 LAB — WET PREP FOR TRICH, YEAST, CLUE
Clue Cells Wet Prep HPF POC: NONE SEEN
Trich, Wet Prep: NONE SEEN
WBC, Wet Prep HPF POC: NONE SEEN

## 2013-10-20 MED ORDER — ESTROGENS, CONJUGATED 0.625 MG/GM VA CREA: TOPICAL_CREAM | VAGINAL | Status: DC

## 2013-10-20 NOTE — Addendum Note (Signed)
Addended by: Nelva Nay on: 10/20/2013 11:13 AM   Modules accepted: Orders

## 2013-10-20 NOTE — Progress Notes (Signed)
Diana Reeves [birth year] [id number]        63 y.o.  G3P3003 for annual exam.  Former patient Dr. Cherylann Reeves. Several issues noted below.  Past medical history,surgical history, problem list, medications, allergies, family history and social history were all reviewed and documented as reviewed in the EPIC chart.  ROS:  12 system ROS performed with pertinent positives and negatives included in the history, assessment and plan.   Additional significant findings :  None   Exam: [name] Vitals:   10/20/13 1027  BP: 124/80  Height: 5' 5.5" (1.664 m)  Weight: 203 lb (92.08 kg)   General appearance:  Normal affect, orientation and appearance. Skin: Grossly normal HEENT: Without gross lesions.  No cervical or supraclavicular adenopathy. Thyroid normal.  Lungs:  Clear without wheezing, rales or rhonchi Cardiac: RR, without RMG Abdominal:  Soft, nontender, without masses, guarding, rebound, organomegaly or hernia Breasts:  Examined lying and sitting without masses, retractions, discharge or axillary adenopathy. Pelvic:  Ext/BUS/vagina with generalized atrophic changes. Slight white discharge noted.  Adnexa  Without masses or tenderness    Anus and perineum  Normal   Rectovaginal  Normal sphincter tone without palpated masses or tenderness.    Assessment/Plan:  63 y.o. G4P3003 female for annual exam status post TAH LSO posterior repair and Burch 1993.   1. Atrophic vaginitis. Patient using Premarin cream 3 times weekly with good results. I reviewed the issues of possible absorption and risks to include thrombosis such as stroke heart attack DVT and breast cancer. Patient's comfortable continuing I refilled her x1 year. 2. Slight vaginal irritation. Exam and wet prep consistent with yeast vaginitis. Patient actually has Diflucan at home and will take one pill now repeat in 3 days if continues. Followup if symptoms persist, worsen or recur. 3. Osteoporosis. DEXA 2007 T score -3.  Patient took Actonel for 5 years and discontinued in 2012 where followup DEXA showed T score -1.9. Repeat DEXA now at 3 year interval. Has been known to be vitamin D deficient in the past. Check vitamin D level today. 4. Pap smear 2012. Pap smear of vaginal cuff done today. Options to stop screening altogether as she status post hysterectomy for benign indications  And age 26. Will readdress on an annual basis. 5. Mammography coming due in December and I reminded her to schedule this. SBE monthly reviewed. 6. Colonoscopy 2014. Repeat at their recommended interval. 7. Health maintenance. No routine blood work done as she has this done at her primary physician's office. Followup one year, sooner as needed.   Note: This document was prepared with digital dictation and possible smart phrase technology. Any transcriptional errors that result from this process are unintentional.   Anastasio Auerbach MD, 11:02 AM 10/20/2013

## 2013-10-20 NOTE — Addendum Note (Signed)
Addended by: Nelva Nay on: 10/20/2013 11:52 AM   Modules accepted: Orders

## 2013-10-20 NOTE — Patient Instructions (Addendum)
Take the Diflucan pill, repeated in several days if needed. Call me if your symptoms persist. Office will contact you as far as the vitamin D level. Schedule and followup for bone density Followup in one year for annual exam  You may obtain a copy of any labs that were done today by logging onto MyChart as outlined in the instructions provided with your AVS (after visit summary). The office will not call with normal lab results but certainly if there are any significant abnormalities then we will contact you.   Health Maintenance, Female A healthy lifestyle and preventative care can promote health and wellness.  Maintain regular health, dental, and eye exams.  Eat a healthy diet. Foods like vegetables, fruits, whole grains, low-fat dairy products, and lean protein foods contain the nutrients you need without too many calories. Decrease your intake of foods high in solid fats, added sugars, and salt. Get information about a proper diet from your caregiver, if necessary.  Regular physical exercise is one of the most important things you can do for your health. Most adults should get at least 150 minutes of moderate-intensity exercise (any activity that increases your heart rate and causes you to sweat) each week. In addition, most adults need muscle-strengthening exercises on 2 or more days a week.   Maintain a healthy weight. The body mass index (BMI) is a screening tool to identify possible weight problems. It provides an estimate of body fat based on height and weight. Your caregiver can help determine your BMI, and can help you achieve or maintain a healthy weight. For adults 20 years and older:  A BMI below 18.5 is considered underweight.  A BMI of 18.5 to 24.9 is normal.  A BMI of 25 to 29.9 is considered overweight.  A BMI of 30 and above is considered obese.  Maintain normal blood lipids and cholesterol by exercising and minimizing your intake of saturated fat. Eat a balanced diet  with plenty of fruits and vegetables. Blood tests for lipids and cholesterol should begin at age 23 and be repeated every 5 years. If your lipid or cholesterol levels are high, you are over 50, or you are a high risk for heart disease, you may need your cholesterol levels checked more frequently.Ongoing high lipid and cholesterol levels should be treated with medicines if diet and exercise are not effective.  If you smoke, find out from your caregiver how to quit. If you do not use tobacco, do not start.  Lung cancer screening is recommended for adults aged 71 80 years who are at high risk for developing lung cancer because of a history of smoking. Yearly low-dose computed tomography (CT) is recommended for people who have at least a 30-pack-year history of smoking and are a current smoker or have quit within the past 15 years. A pack year of smoking is smoking an average of 1 pack of cigarettes a day for 1 year (for example: 1 pack a day for 30 years or 2 packs a day for 15 years). Yearly screening should continue until the smoker has stopped smoking for at least 15 years. Yearly screening should also be stopped for people who develop a health problem that would prevent them from having lung cancer treatment.  If you are pregnant, do not drink alcohol. If you are breastfeeding, be very cautious about drinking alcohol. If you are not pregnant and choose to drink alcohol, do not exceed 1 drink per day. One drink is considered to be 12  ounces (355 mL) of beer, 5 ounces (148 mL) of wine, or 1.5 ounces (44 mL) of liquor.  Avoid use of street drugs. Do not share needles with anyone. Ask for help if you need support or instructions about stopping the use of drugs.  High blood pressure causes heart disease and increases the risk of stroke. Blood pressure should be checked at least every 1 to 2 years. Ongoing high blood pressure should be treated with medicines, if weight loss and exercise are not  effective.  If you are 47 to 65 years old, ask your caregiver if you should take aspirin to prevent strokes.  Diabetes screening involves taking a blood sample to check your fasting blood sugar level. This should be done once every 3 years, after age 49, if you are within normal weight and without risk factors for diabetes. Testing should be considered at a younger age or be carried out more frequently if you are overweight and have at least 1 risk factor for diabetes.  Breast cancer screening is essential preventative care for women. You should practice "breast self-awareness." This means understanding the normal appearance and feel of your breasts and may include breast self-examination. Any changes detected, no matter how small, should be reported to a caregiver. Women in their 27s and 30s should have a clinical breast exam (CBE) by a caregiver as part of a regular health exam every 1 to 3 years. After age 6, women should have a CBE every year. Starting at age 14, women should consider having a mammogram (breast X-ray) every year. Women who have a family history of breast cancer should talk to their caregiver about genetic screening. Women at a high risk of breast cancer should talk to their caregiver about having an MRI and a mammogram every year.  Breast cancer gene (BRCA)-related cancer risk assessment is recommended for women who have family members with BRCA-related cancers. BRCA-related cancers include breast, ovarian, tubal, and peritoneal cancers. Having family members with these cancers may be associated with an increased risk for harmful changes (mutations) in the breast cancer genes BRCA1 and BRCA2. Results of the assessment will determine the need for genetic counseling and BRCA1 and BRCA2 testing.  The Pap test is a screening test for cervical cancer. Women should have a Pap test starting at age 30. Between ages 14 and 10, Pap tests should be repeated every 2 years. Beginning at age 85,  you should have a Pap test every 3 years as long as the past 3 Pap tests have been normal. If you had a hysterectomy for a problem that was not cancer or a condition that could lead to cancer, then you no longer need Pap tests. If you are between ages 31 and 43, and you have had normal Pap tests going back 10 years, you no longer need Pap tests. If you have had past treatment for cervical cancer or a condition that could lead to cancer, you need Pap tests and screening for cancer for at least 20 years after your treatment. If Pap tests have been discontinued, risk factors (such as a new sexual partner) need to be reassessed to determine if screening should be resumed. Some women have medical problems that increase the chance of getting cervical cancer. In these cases, your caregiver may recommend more frequent screening and Pap tests.  The human papillomavirus (HPV) test is an additional test that may be used for cervical cancer screening. The HPV test looks for the virus that can cause  the cell changes on the cervix. The cells collected during the Pap test can be tested for HPV. The HPV test could be used to screen women aged 80 years and older, and should be used in women of any age who have unclear Pap test results. After the age of 85, women should have HPV testing at the same frequency as a Pap test.  Colorectal cancer can be detected and often prevented. Most routine colorectal cancer screening begins at the age of 14 and continues through age 73. However, your caregiver may recommend screening at an earlier age if you have risk factors for colon cancer. On a yearly basis, your caregiver may provide home test kits to check for hidden blood in the stool. Use of a small camera at the end of a tube, to directly examine the colon (sigmoidoscopy or colonoscopy), can detect the earliest forms of colorectal cancer. Talk to your caregiver about this at age 51, when routine screening begins. Direct examination of  the colon should be repeated every 5 to 10 years through age 61, unless early forms of pre-cancerous polyps or small growths are found.  Hepatitis C blood testing is recommended for all people born from [birth year] through 1965 and any individual with known risks for hepatitis C.  Practice safe sex. Use condoms and avoid high-risk sexual practices to reduce the spread of sexually transmitted infections (STIs). Sexually active women aged 74 and younger should be checked for Chlamydia, which is a common sexually transmitted infection. Older women with new or multiple partners should also be tested for Chlamydia. Testing for other STIs is recommended if you are sexually active and at increased risk.  Osteoporosis is a disease in which the bones lose minerals and strength with aging. This can result in serious bone fractures. The risk of osteoporosis can be identified using a bone density scan. Women ages 55 and over and women at risk for fractures or osteoporosis should discuss screening with their caregivers. Ask your caregiver whether you should be taking a calcium supplement or vitamin D to reduce the rate of osteoporosis.  Menopause can be associated with physical symptoms and risks. Hormone replacement therapy is available to decrease symptoms and risks. You should talk to your caregiver about whether hormone replacement therapy is right for you.  Use sunscreen. Apply sunscreen liberally and repeatedly throughout the day. You should seek shade when your shadow is shorter than you. Protect yourself by wearing long sleeves, pants, a wide-brimmed hat, and sunglasses year round, whenever you are outdoors.  Notify your caregiver of new moles or changes in moles, especially if there is a change in shape or color. Also notify your caregiver if a mole is larger than the size of a pencil eraser.  Stay current with your immunizations. Document Released: 09/15/2010 Document Revised: 06/27/2012 Document Reviewed:  09/15/2010 [facility] Patient Information 2014 [place].

## 2013-10-21 LAB — URINALYSIS W MICROSCOPIC + REFLEX CULTURE
Bilirubin Urine: NEGATIVE
CRYSTALS: NONE SEEN
Casts: NONE SEEN
Glucose, UA: NEGATIVE mg/dL
Hgb urine dipstick: NEGATIVE
KETONES UR: NEGATIVE mg/dL
Leukocytes, UA: NEGATIVE
Nitrite: NEGATIVE
Protein, ur: NEGATIVE mg/dL
Specific Gravity, Urine: 1.018 (ref 1.005–1.030)
Urobilinogen, UA: 0.2 mg/dL (ref 0.0–1.0)
pH: 5.5 (ref 5.0–8.0)

## 2013-10-21 LAB — VITAMIN D 25 HYDROXY (VIT D DEFICIENCY, FRACTURES): VIT D 25 HYDROXY: 34 ng/mL (ref 30–89)

## 2013-10-22 ENCOUNTER — Telehealth: Payer: Self-pay | Admitting: Gynecology

## 2013-10-22 NOTE — Telephone Encounter (Signed)
Tell patient urine consistent with UTI.  Recommend Septra DS PO BID X 3 days.

## 2013-10-23 LAB — URINE CULTURE: Colony Count: 10000

## 2013-10-23 LAB — HM PAP SMEAR

## 2013-10-23 LAB — CYTOLOGY - PAP

## 2013-10-23 NOTE — Telephone Encounter (Signed)
Left message for pt to call.

## 2013-10-25 ENCOUNTER — Other Ambulatory Visit: Payer: Self-pay | Admitting: Gynecology

## 2013-10-25 MED ORDER — CIPROFLOXACIN HCL 250 MG PO TABS: 250.0000 mg | ORAL_TABLET | Freq: Two times a day (BID) | ORAL | Status: DC

## 2013-10-25 NOTE — Telephone Encounter (Signed)
Left message for pt to call.

## 2013-10-30 ENCOUNTER — Encounter: Payer: Self-pay | Admitting: Obstetrics and Gynecology

## 2013-11-03 NOTE — Telephone Encounter (Signed)
Pt never returned my phone call, I called pharmacy to see if rx was picked up and it was.

## 2013-11-16 ENCOUNTER — Ambulatory Visit (INDEPENDENT_AMBULATORY_CARE_PROVIDER_SITE_OTHER): Payer: BC Managed Care – PPO | Admitting: Gynecology

## 2013-11-16 ENCOUNTER — Encounter: Payer: Self-pay | Admitting: Gynecology

## 2013-11-16 DIAGNOSIS — R300 Dysuria: Secondary | ICD-10-CM

## 2013-11-16 DIAGNOSIS — N898 Other specified noninflammatory disorders of vagina: Secondary | ICD-10-CM

## 2013-11-16 DIAGNOSIS — N899 Noninflammatory disorder of vagina, unspecified: Secondary | ICD-10-CM

## 2013-11-16 LAB — URINALYSIS W MICROSCOPIC + REFLEX CULTURE
BILIRUBIN URINE: NEGATIVE
Glucose, UA: NEGATIVE mg/dL
Hgb urine dipstick: NEGATIVE
Leukocytes, UA: NEGATIVE
Nitrite: NEGATIVE
Protein, ur: NEGATIVE mg/dL
Urobilinogen, UA: 0.2 mg/dL (ref 0.0–1.0)
pH: 5 (ref 5.0–8.0)

## 2013-11-16 LAB — WET PREP FOR TRICH, YEAST, CLUE
Clue Cells Wet Prep HPF POC: NONE SEEN
TRICH WET PREP: NONE SEEN
WBC, Wet Prep HPF POC: NONE SEEN
Yeast Wet Prep HPF POC: NONE SEEN

## 2013-11-16 MED ORDER — TERCONAZOLE 0.8 % VA CREA: 1.0000 | TOPICAL_CREAM | Freq: Every day | VAGINAL | Status: DC

## 2013-11-16 NOTE — Progress Notes (Signed)
LORETHA URE 1950/08/09 [id number]        63 y.o.  [id number] presents complaining of vaginal irritation and discharge. Feels that she's getting another yeast infection which she has had in the past. No dysuria, frequency, low back pain or suprapubic discomfort. There is a little stinging with urination but it sounds to be external.  Past medical history,surgical history, problem list, medications, allergies, family history and social history were all reviewed and documented in the EPIC chart.  Directed ROS with pertinent positives and negatives documented in the history of present illness/assessment and plan.  Exam: [name] assistant General appearance:  Normal Pelvic external BUS vagina with white discharge. Bimanual without masses or tenderness  Assessment/Plan:  63 y.o. [id number] with history and exam consistent with yeast. Wet prep is negative. Urinalysis is negative. Will cover with Terazol 3 day cream at her request. She feels it works better than Diflucan. Followup if her symptoms continue, worsen or recur.   Note: This document was prepared with digital dictation and possible smart phrase technology. Any transcriptional errors that result from this process are unintentional.   Anastasio Auerbach MD, 4:29 PM 11/16/2013

## 2013-11-16 NOTE — Patient Instructions (Signed)
Use the Terazol cream 3 nights in a row. Followup if the symptoms persist, worsen or recur.

## 2013-11-22 ENCOUNTER — Telehealth: Payer: Self-pay

## 2013-11-22 ENCOUNTER — Other Ambulatory Visit: Payer: Self-pay | Admitting: Orthopedic Surgery

## 2013-11-22 ENCOUNTER — Ambulatory Visit
Admission: RE | Admit: 2013-11-22 | Discharge: 2013-11-22 | Disposition: A | Discharge status: Home / Self Care | Payer: BC Managed Care – PPO | Source: Ambulatory Visit | Attending: Orthopedic Surgery | Admitting: Orthopedic Surgery

## 2013-11-22 DIAGNOSIS — R102 Pelvic and perineal pain: Secondary | ICD-10-CM

## 2013-11-22 NOTE — Telephone Encounter (Signed)
Patient called stating she has fallen and has had several x-rays and will be having CT scan tomorrow. She wondered if we would still want to do her BD test scheduled for Monday if she is in pain.  I called and left message on voice mail cell phone that this will have to be her call.  That if something broken she may not feel up to it. If her pain resolves and she wants to keep appt that is fine. I told her perfectly fine to postpone appt as well until she gets all this figured out and feels better.

## 2013-12-07 ENCOUNTER — Other Ambulatory Visit: Payer: Self-pay | Admitting: Gynecology

## 2014-01-15 ENCOUNTER — Ambulatory Visit (INDEPENDENT_AMBULATORY_CARE_PROVIDER_SITE_OTHER): Payer: BC Managed Care – PPO

## 2014-01-15 ENCOUNTER — Encounter: Payer: Self-pay | Admitting: Gynecology

## 2014-01-15 DIAGNOSIS — M810 Age-related osteoporosis without current pathological fracture: Secondary | ICD-10-CM

## 2014-01-16 ENCOUNTER — Encounter: Payer: Self-pay | Admitting: Gynecology

## 2014-01-24 LAB — HM DEXA SCAN

## 2014-05-27 ENCOUNTER — Ambulatory Visit (INDEPENDENT_AMBULATORY_CARE_PROVIDER_SITE_OTHER): Payer: BC Managed Care – PPO

## 2014-05-27 ENCOUNTER — Ambulatory Visit (INDEPENDENT_AMBULATORY_CARE_PROVIDER_SITE_OTHER): Payer: BC Managed Care – PPO | Admitting: Family Medicine

## 2014-05-27 ENCOUNTER — Encounter: Payer: Self-pay | Admitting: Family Medicine

## 2014-05-27 VITALS — BP 158/84 | HR 78 | Temp 98.0°F | Resp 20 | Ht 65.25 in | Wt 209.4 lb

## 2014-05-27 DIAGNOSIS — J329 Chronic sinusitis, unspecified: Secondary | ICD-10-CM | POA: Diagnosis not present

## 2014-05-27 DIAGNOSIS — R05 Cough: Secondary | ICD-10-CM

## 2014-05-27 DIAGNOSIS — R059 Cough, unspecified: Secondary | ICD-10-CM

## 2014-05-27 DIAGNOSIS — R062 Wheezing: Secondary | ICD-10-CM

## 2014-05-27 LAB — POCT CBC
Granulocyte percent: 72.9 %G (ref 37–80)
HCT, POC: 39.5 % (ref 37.7–47.9)
Hemoglobin: 12.8 g/dL (ref 12.2–16.2)
Lymph, poc: 1.8 (ref 0.6–3.4)
MCH, POC: 26.5 pg — AB (ref 27–31.2)
MCHC: 32.3 g/dL (ref 31.8–35.4)
MCV: 82 fL (ref 80–97)
MID (cbc): 0.5 (ref 0–0.9)
MPV: 6.6 fL (ref 0–99.8)
POC Granulocyte: 6.3 (ref 2–6.9)
POC LYMPH PERCENT: 21.3 %L (ref 10–50)
POC MID %: 5.8 %M (ref 0–12)
Platelet Count, POC: 224 10*3/uL (ref 142–424)
RBC: 4.82 M/uL (ref 4.04–5.48)
RDW, POC: 14.6 %
WBC: 8.6 10*3/uL (ref 4.6–10.2)

## 2014-05-27 MED ORDER — ALBUTEROL SULFATE (2.5 MG/3ML) 0.083% IN NEBU
2.5000 mg | INHALATION_SOLUTION | Freq: Once | RESPIRATORY_TRACT | Status: AC
  Administered 2014-05-27: 2.5 mg via RESPIRATORY_TRACT

## 2014-05-27 MED ORDER — PREDNISONE 20 MG PO TABS: 40.0000 mg | ORAL_TABLET | Freq: Every day | ORAL | Status: DC

## 2014-05-27 MED ORDER — AMOXICILLIN-POT CLAVULANATE 875-125 MG PO TABS: 1.0000 | ORAL_TABLET | Freq: Two times a day (BID) | ORAL | Status: DC

## 2014-05-27 MED ORDER — HYDROCODONE-HOMATROPINE 5-1.5 MG/5ML PO SYRP: 5.0000 mL | ORAL_SOLUTION | Freq: Every evening | ORAL | Status: DC | PRN

## 2014-05-27 NOTE — Progress Notes (Signed)
This chart was scribed for Robyn Haber, MD by Einar Pheasant, ED Scribe. This patient was seen in room 3 and the patient's care was started at 10:26 AM.  Subjective:    Patient ID: Diana Reeves, female    DOB: 1951/02/07, 64 y.o.   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 [employer]  presents to the office complaining of a gradual onset persistent cough that started 4 days ago. She also complaining of associated chest congestion and wheezing that started worsening last night. Pt reports ample sick contacts secondary to her place of employment and one sick contact who was diagnosed with pneumonia. She admits to receiving a pneumonia vaccine 6 years ago.  Pt denies any fever, neck pain, sore throat, visual disturbance, CP,  abdominal pain, nausea, emesis, diarrhea, urinary symptoms, back pain, HA, weakness, numbness and rash as associated symptoms.     Current Outpatient Prescriptions on File Prior to Visit  Medication Sig Dispense Refill   Azelastine-Fluticasone (DYMISTA NA) Place into the nose.     clobetasol ointment (TEMOVATE) 0.05 % Apply topically 2 (two) times daily. 45 g 0   DULoxetine (CYMBALTA) 60 MG capsule TAKE 1 CAPSULE (60 MG TOTAL) BY MOUTH DAILY. 90 capsule 3   NEXIUM 40 MG capsule TAKE 1 CAPSULE (40 MG TOTAL) BY MOUTH DAILY BEFORE BREAKFAST. 30 capsule 10   PREMARIN vaginal cream PLACE 1/3 APPLICATOR EVERY OTHER NIGHT 30 g 0   terconazole (TERAZOL 3) 0.8 % vaginal cream Place 1 applicator vaginally at bedtime. For 3 nights 20 g 0   zolpidem (AMBIEN) 10 MG tablet Take 1 tablet (10 mg total) by mouth at bedtime as needed for sleep. 30 tablet 2   clonazePAM (KLONOPIN) 0.5 MG tablet Take 1 tablet (0.5 mg total) by mouth 3 (three) times daily as needed for anxiety. 60 tablet  1   No current facility-administered medications on file prior to visit.    Review of Systems  Constitutional: Negative for fever and chills.  HENT: Positive for congestion. Negative for ear pain, rhinorrhea, sinus pressure, sore throat, trouble swallowing and voice change.   Eyes: Negative for discharge.  Respiratory: Positive for cough, shortness of breath and wheezing. Negative for stridor.   Cardiovascular: Negative for chest pain.  Gastrointestinal: Negative for nausea, vomiting and abdominal pain.  Genitourinary: Negative.    Objective:   Physical Exam  Vitals reviewed. BP 158/84 mmHg   Pulse 78   Temp(Src) 98 F (36.7 C) (Oral)   Resp 20   Ht 5' 5.25" (1.657 m)   Wt 209 lb 6.4 oz (94.983 kg)   BMI 34.59 kg/m2   SpO2 92%   LMP 03/17/1991  General Appearance:    Alert, cooperative, no distress, appears stated age. Appearance of that of an apprehensible woman with labored breathing.  Head:    Normocephalic, without obvious abnormality, atraumatic  Eyes:    PERRL, conjunctiva/corneas clear, EOM's intact, fundi    benign, both eyes  Ears:    Normal TM's and external ear canals, both ears  Nose:   Nares normal, septum midline, mucosa normal, no drainage    or sinus tenderness  Throat:   Lips, mucosa, and tongue normal; teeth and gums normal  Neck:   Supple, symmetrical, trachea midline,  no adenopathy;    thyroid:  no enlargement/tenderness/nodules; no carotid   bruit or JVD  Back:     Symmetric, no curvature, ROM normal, no CVA tenderness  Lungs:     Wheezing noted bilaterally; respirations unlabored  Chest Wall:    No tenderness or deformity   Heart:    Regular rate and rhythm, S1 and S2 normal, no murmur, rub   or gallop  Breast Exam:    No tenderness, masses, or nipple abnormality  Abdomen:     Soft, non-tender, bowel sounds active all four quadrants,    no masses, no organomegaly  Genitalia:    Normal female without lesion, discharge or tenderness  Rectal:    Normal tone,  normal prostate, no masses or tenderness;   guaiac negative stool  Extremities:   Extremities normal, atraumatic, no cyanosis or edema  Pulses:   2+ and symmetric all extremities  Skin:   Skin color, texture, turgor normal, no rashes or lesions  Lymph nodes:   Cervical, supraclavicular, and axillary nodes normal  Neurologic:   CNII-XII intact, normal strength, sensation and reflexes    throughout  [facility] reading (PRIMARY) by  Dr. Joseph Art:  Chest x-ray shows heavy interstitial markings but no definite infiltrate. Heart looks normal size and shape, no chest wall abnormalities  Results for orders placed or performed in visit on 05/27/14  POCT CBC  Result Value Ref Range   WBC 8.6 4.6 - 10.2 K/uL   Lymph, poc 1.8 0.6 - 3.4   POC LYMPH PERCENT 21.3 10 - 50 %L   MID (cbc) 0.5 0 - 0.9   POC MID % 5.8 0 - 12 %M   POC Granulocyte 6.3 2 - 6.9   Granulocyte percent 72.9 37 - 80 %G   RBC 4.82 4.04 - 5.48 M/uL   Hemoglobin 12.8 12.2 - 16.2 g/dL   HCT, POC 39.5 37.7 - 47.9 %   MCV 82.0 80 - 97 fL   MCH, POC 26.5 (A) 27 - 31.2 pg   MCHC 32.3 31.8 - 35.4 g/dL   RDW, POC 14.6 %   Platelet Count, POC 224 142 - 424 K/uL   MPV 6.6 0 - 99.8 fL    Assessment & Plan:   This chart was scribed in my presence and reviewed by me personally.    ICD-9-CM ICD-10-CM   1. Cough 786.2 R05 POCT CBC     DG Chest 2 View     albuterol (PROVENTIL) (2.5 MG/3ML) 0.083% nebulizer solution 2.5 mg     predniSONE (DELTASONE) 20 MG tablet     amoxicillin-clavulanate (AUGMENTIN) 875-125 MG per tablet     HYDROcodone-homatropine (HYCODAN) 5-1.5 MG/5ML syrup  2. Wheezing 786.07 R06.2 POCT CBC     DG Chest 2 View     albuterol (PROVENTIL) (2.5 MG/3ML) 0.083% nebulizer solution 2.5 mg     predniSONE (DELTASONE) 20 MG tablet     amoxicillin-clavulanate (AUGMENTIN) 875-125 MG per tablet  3. Sinusitis, unspecified chronicity, unspecified location 473.9 J32.9 POCT CBC     DG Chest 2 View     albuterol (PROVENTIL) (2.5  MG/3ML) 0.083% nebulizer solution 2.5 mg     amoxicillin-clavulanate (AUGMENTIN) 875-125 MG per tablet     Signed, Robyn Haber, MD

## 2014-05-27 NOTE — Patient Instructions (Addendum)
The x-ray shows significant swelling the bronchial tubes suggestive of a bronchitis with inflammation of the bronchial tubes. Combined with the sinus symptoms, I think an antibiotic is necessary to clear this up. In addition I'm giving you some prednisone just for a few days to open up your lungs and stopped the bronchospasm.  You should be feeling better by later this afternoon after a few hours of the medicine. Please let us know if this gets worse or has not significantly resolved by tomorrow.

## 2014-06-04 ENCOUNTER — Other Ambulatory Visit: Payer: Self-pay | Admitting: Gynecology

## 2014-06-05 ENCOUNTER — Telehealth: Payer: Self-pay | Admitting: *Deleted

## 2014-06-05 MED ORDER — ESTROGENS, CONJUGATED 0.625 MG/GM VA CREA: TOPICAL_CREAM | VAGINAL | Status: DC

## 2014-06-05 NOTE — Telephone Encounter (Signed)
Pt medication for premarin vaginal cream was denied on 06/04/14. States pt needs appointment, per TF note on 10/20/13 "Patient using Premarin cream 3 times weekly with good results. I reviewed the issues of possible absorption and risks to include thrombosis such as stroke heart attack DVT and breast cancer. Patient's comfortable continuing I refilled her x1 year. But on this visit only 1 refills was given. I am going to refill Rx until Aug. 2016 per note.

## 2014-06-18 ENCOUNTER — Other Ambulatory Visit: Payer: Self-pay | Admitting: Internal Medicine

## 2014-07-16 ENCOUNTER — Other Ambulatory Visit: Payer: Self-pay | Admitting: Internal Medicine

## 2014-07-30 ENCOUNTER — Telehealth: Payer: Self-pay | Admitting: Internal Medicine

## 2014-07-30 MED ORDER — DULOXETINE HCL 60 MG PO CPEP: ORAL_CAPSULE | ORAL | Status: DC

## 2014-07-30 NOTE — Telephone Encounter (Signed)
Done, pharmacy to notifiy

## 2014-07-30 NOTE — Telephone Encounter (Signed)
Patient is requesting DULoxetine (CYMBALTA) 60 MG capsule [[id number]. I did make an appointment with her on 6/15 and she is hoping she could give her one more month refill till then because she is totally out of it. Please advise

## 2014-08-15 LAB — HM DIABETES EYE EXAM

## 2014-08-28 ENCOUNTER — Ambulatory Visit: Payer: BC Managed Care – PPO | Admitting: Internal Medicine

## 2014-08-29 ENCOUNTER — Encounter: Payer: Self-pay | Admitting: Internal Medicine

## 2014-08-29 ENCOUNTER — Other Ambulatory Visit (INDEPENDENT_AMBULATORY_CARE_PROVIDER_SITE_OTHER): Payer: BC Managed Care – PPO

## 2014-08-29 ENCOUNTER — Other Ambulatory Visit: Payer: Self-pay | Admitting: Internal Medicine

## 2014-08-29 ENCOUNTER — Ambulatory Visit (INDEPENDENT_AMBULATORY_CARE_PROVIDER_SITE_OTHER): Payer: BC Managed Care – PPO | Admitting: Internal Medicine

## 2014-08-29 VITALS — BP 126/80 | HR 83 | Temp 98.3°F | Resp 16 | Ht 65.25 in | Wt 202.0 lb

## 2014-08-29 DIAGNOSIS — E785 Hyperlipidemia, unspecified: Secondary | ICD-10-CM | POA: Diagnosis not present

## 2014-08-29 DIAGNOSIS — E118 Type 2 diabetes mellitus with unspecified complications: Secondary | ICD-10-CM

## 2014-08-29 DIAGNOSIS — Z23 Encounter for immunization: Secondary | ICD-10-CM | POA: Diagnosis not present

## 2014-08-29 DIAGNOSIS — I10 Essential (primary) hypertension: Secondary | ICD-10-CM | POA: Diagnosis not present

## 2014-08-29 DIAGNOSIS — F418 Other specified anxiety disorders: Secondary | ICD-10-CM

## 2014-08-29 DIAGNOSIS — F409 Phobic anxiety disorder, unspecified: Secondary | ICD-10-CM | POA: Insufficient documentation

## 2014-08-29 DIAGNOSIS — Z0000 Encounter for general adult medical examination without abnormal findings: Secondary | ICD-10-CM

## 2014-08-29 DIAGNOSIS — F5105 Insomnia due to other mental disorder: Secondary | ICD-10-CM

## 2014-08-29 LAB — COMPREHENSIVE METABOLIC PANEL
ALBUMIN: 4.4 g/dL (ref 3.5–5.2)
ALT: 26 U/L (ref 0–35)
AST: 36 U/L (ref 0–37)
Alkaline Phosphatase: 77 U/L (ref 39–117)
BUN: 18 mg/dL (ref 6–23)
CHLORIDE: 107 meq/L (ref 96–112)
CO2: 27 mEq/L (ref 19–32)
CREATININE: 0.68 mg/dL (ref 0.40–1.20)
Calcium: 9.5 mg/dL (ref 8.4–10.5)
GFR: 91.95 mL/min (ref >60.00)
GLUCOSE: 113 mg/dL — AB (ref 70–99)
POTASSIUM: 4 meq/L (ref 3.5–5.1)
Sodium: 140 mEq/L (ref 135–145)
Total Bilirubin: 0.6 mg/dL (ref 0.2–1.2)
Total Protein: 6.9 g/dL (ref 6.0–8.3)

## 2014-08-29 LAB — URINALYSIS, ROUTINE W REFLEX MICROSCOPIC
Bilirubin Urine: NEGATIVE
Hgb urine dipstick: NEGATIVE
KETONES UR: NEGATIVE
Leukocytes, UA: NEGATIVE
Nitrite: NEGATIVE
Specific Gravity, Urine: >=1.03 — AB (ref 1.000–1.030)
Total Protein, Urine: NEGATIVE
UROBILINOGEN UA: 0.2 (ref 0.0–1.0)
Urine Glucose: NEGATIVE
pH: 5 (ref 5.0–8.0)

## 2014-08-29 LAB — LIPID PANEL
Cholesterol: 152 mg/dL (ref 0–200)
HDL: 43.8 mg/dL (ref >39.00)
LDL Cholesterol: 91 mg/dL (ref 0–99)
NonHDL: 108.2
TRIGLYCERIDES: 88 mg/dL (ref 0.0–149.0)
Total CHOL/HDL Ratio: 3
VLDL: 17.6 mg/dL (ref 0.0–40.0)

## 2014-08-29 LAB — CBC WITH DIFFERENTIAL/PLATELET
BASOS ABS: 0 10*3/uL (ref 0.0–0.1)
BASOS PCT: 0.6 % (ref 0.0–3.0)
EOS ABS: 0.1 10*3/uL (ref 0.0–0.7)
Eosinophils Relative: 2.1 % (ref 0.0–5.0)
HEMATOCRIT: 40.2 % (ref 36.0–46.0)
Hemoglobin: 13.5 g/dL (ref 12.0–15.0)
LYMPHS ABS: 2.4 10*3/uL (ref 0.7–4.0)
Lymphocytes Relative: 39.1 % (ref 12.0–46.0)
MCHC: 33.7 g/dL (ref 30.0–36.0)
MCV: 82.3 fl (ref 78.0–100.0)
Monocytes Absolute: 0.4 10*3/uL (ref 0.1–1.0)
Monocytes Relative: 6.4 % (ref 3.0–12.0)
Neutro Abs: 3.1 10*3/uL (ref 1.4–7.7)
Neutrophils Relative %: 51.8 % (ref 43.0–77.0)
Platelets: 246 10*3/uL (ref 150.0–400.0)
RBC: 4.89 Mil/uL (ref 3.87–5.11)
RDW: 14.7 % (ref 11.5–15.5)
WBC: 6.1 10*3/uL (ref 4.0–10.5)

## 2014-08-29 LAB — HEMOGLOBIN A1C: Hgb A1c MFr Bld: 6.4 % (ref 4.6–6.5)

## 2014-08-29 LAB — MICROALBUMIN / CREATININE URINE RATIO
Creatinine,U: 266 mg/dL
MICROALB UR: 1.7 mg/dL (ref 0.0–1.9)
Microalb Creat Ratio: 0.6 mg/g (ref 0.0–30.0)

## 2014-08-29 LAB — TSH: TSH: 0.94 u[IU]/mL (ref 0.35–4.50)

## 2014-08-29 MED ORDER — ZOLPIDEM TARTRATE 10 MG PO TABS: 10.0000 mg | ORAL_TABLET | Freq: Every evening | ORAL | Status: DC | PRN

## 2014-08-29 MED ORDER — CLONAZEPAM 0.5 MG PO TABS: 0.5000 mg | ORAL_TABLET | Freq: Three times a day (TID) | ORAL | Status: DC | PRN

## 2014-08-29 NOTE — Patient Instructions (Signed)
Preventive Care for Adults A healthy lifestyle and preventive care can promote health and wellness. Preventive health guidelines for women include the following key practices.  A routine yearly physical is a good way to check with your health care provider about your health and preventive screening. It is a chance to share any concerns and updates on your health and to receive a thorough exam.  Visit your dentist for a routine exam and preventive care every 6 months. Brush your teeth twice a day and floss once a day. Good oral hygiene prevents tooth decay and gum disease.  The frequency of eye exams is based on your age, health, family medical history, use of contact lenses, and other factors. Follow your health care provider's recommendations for frequency of eye exams.  Eat a healthy diet. Foods like vegetables, fruits, whole grains, low-fat dairy products, and lean protein foods contain the nutrients you need without too many calories. Decrease your intake of foods high in solid fats, added sugars, and salt. Eat the right amount of calories for you.Get information about a proper diet from your health care provider, if necessary.  Regular physical exercise is one of the most important things you can do for your health. Most adults should get at least 150 minutes of moderate-intensity exercise (any activity that increases your heart rate and causes you to sweat) each week. In addition, most adults need muscle-strengthening exercises on 2 or more days a week.  Maintain a healthy weight. The body mass index (BMI) is a screening tool to identify possible weight problems. It provides an estimate of body fat based on height and weight. Your health care provider can find your BMI and can help you achieve or maintain a healthy weight.For adults 20 years and older:  A BMI below 18.5 is considered underweight.  A BMI of 18.5 to 24.9 is normal.  A BMI of 25 to 29.9 is considered overweight.  A BMI of  30 and above is considered obese.  Maintain normal blood lipids and cholesterol levels by exercising and minimizing your intake of saturated fat. Eat a balanced diet with plenty of fruit and vegetables. Blood tests for lipids and cholesterol should begin at age 76 and be repeated every 5 years. If your lipid or cholesterol levels are high, you are over 50, or you are at high risk for heart disease, you may need your cholesterol levels checked more frequently.Ongoing high lipid and cholesterol levels should be treated with medicines if diet and exercise are not working.  If you smoke, find out from your health care provider how to quit. If you do not use tobacco, do not start.  Lung cancer screening is recommended for adults aged 22-80 years who are at high risk for developing lung cancer because of a history of smoking. A yearly low-dose CT scan of the lungs is recommended for people who have at least a 30-pack-year history of smoking and are a current smoker or have quit within the past 15 years. A pack year of smoking is smoking an average of 1 pack of cigarettes a day for 1 year (for example: 1 pack a day for 30 years or 2 packs a day for 15 years). Yearly screening should continue until the smoker has stopped smoking for at least 15 years. Yearly screening should be stopped for people who develop a health problem that would prevent them from having lung cancer treatment.  If you are pregnant, do not drink alcohol. If you are breastfeeding,  be very cautious about drinking alcohol. If you are not pregnant and choose to drink alcohol, do not have more than 1 drink per day. One drink is considered to be 12 ounces (355 mL) of beer, 5 ounces (148 mL) of wine, or 1.5 ounces (44 mL) of liquor.  Avoid use of street drugs. Do not share needles with anyone. Ask for help if you need support or instructions about stopping the use of drugs.  High blood pressure causes heart disease and increases the risk of  stroke. Your blood pressure should be checked at least every 1 to 2 years. Ongoing high blood pressure should be treated with medicines if weight loss and exercise do not work.  If you are 75-52 years old, ask your health care provider if you should take aspirin to prevent strokes.  Diabetes screening involves taking a blood sample to check your fasting blood sugar level. This should be done once every 3 years, after age 15, if you are within normal weight and without risk factors for diabetes. Testing should be considered at a younger age or be carried out more frequently if you are overweight and have at least 1 risk factor for diabetes.  Breast cancer screening is essential preventive care for women. You should practice "breast self-awareness." This means understanding the normal appearance and feel of your breasts and may include breast self-examination. Any changes detected, no matter how small, should be reported to a health care provider. Women in their 58s and 30s should have a clinical breast exam (CBE) by a health care provider as part of a regular health exam every 1 to 3 years. After age 16, women should have a CBE every year. Starting at age 53, women should consider having a mammogram (breast X-ray test) every year. Women who have a family history of breast cancer should talk to their health care provider about genetic screening. Women at a high risk of breast cancer should talk to their health care providers about having an MRI and a mammogram every year.  Breast cancer gene (BRCA)-related cancer risk assessment is recommended for women who have family members with BRCA-related cancers. BRCA-related cancers include breast, ovarian, tubal, and peritoneal cancers. Having family members with these cancers may be associated with an increased risk for harmful changes (mutations) in the breast cancer genes BRCA1 and BRCA2. Results of the assessment will determine the need for genetic counseling and  BRCA1 and BRCA2 testing.  Routine pelvic exams to screen for cancer are no longer recommended for nonpregnant women who are considered low risk for cancer of the pelvic organs (ovaries, uterus, and vagina) and who do not have symptoms. Ask your health care provider if a screening pelvic exam is right for you.  If you have had past treatment for cervical cancer or a condition that could lead to cancer, you need Pap tests and screening for cancer for at least 20 years after your treatment. If Pap tests have been discontinued, your risk factors (such as having a new sexual partner) need to be reassessed to determine if screening should be resumed. Some women have medical problems that increase the chance of getting cervical cancer. In these cases, your health care provider may recommend more frequent screening and Pap tests.  The HPV test is an additional test that may be used for cervical cancer screening. The HPV test looks for the virus that can cause the cell changes on the cervix. The cells collected during the Pap test can be  tested for HPV. The HPV test could be used to screen women aged 30 years and older, and should be used in women of any age who have unclear Pap test results. After the age of 30, women should have HPV testing at the same frequency as a Pap test.  Colorectal cancer can be detected and often prevented. Most routine colorectal cancer screening begins at the age of 50 years and continues through age 75 years. However, your health care provider may recommend screening at an earlier age if you have risk factors for colon cancer. On a yearly basis, your health care provider may provide home test kits to check for hidden blood in the stool. Use of a small camera at the end of a tube, to directly examine the colon (sigmoidoscopy or colonoscopy), can detect the earliest forms of colorectal cancer. Talk to your health care provider about this at age 50, when routine screening begins. Direct  exam of the colon should be repeated every 5-10 years through age 75 years, unless early forms of pre-cancerous polyps or small growths are found.  People who are at an increased risk for hepatitis B should be screened for this virus. You are considered at high risk for hepatitis B if:  You were born in a country where hepatitis B occurs often. Talk with your health care provider about which countries are considered high risk.  Your parents were born in a high-risk country and you have not received a shot to protect against hepatitis B (hepatitis B vaccine).  You have HIV or AIDS.  You use needles to inject street drugs.  You live with, or have sex with, someone who has hepatitis B.  You get hemodialysis treatment.  You take certain medicines for conditions like cancer, organ transplantation, and autoimmune conditions.  Hepatitis C blood testing is recommended for all people born from 1945 through 1965 and any individual with known risks for hepatitis C.  Practice safe sex. Use condoms and avoid high-risk sexual practices to reduce the spread of sexually transmitted infections (STIs). STIs include gonorrhea, chlamydia, syphilis, trichomonas, herpes, HPV, and human immunodeficiency virus (HIV). Herpes, HIV, and HPV are viral illnesses that have no cure. They can result in disability, cancer, and death.  You should be screened for sexually transmitted illnesses (STIs) including gonorrhea and chlamydia if:  You are sexually active and are younger than 24 years.  You are older than 24 years and your health care provider tells you that you are at risk for this type of infection.  Your sexual activity has changed since you were last screened and you are at an increased risk for chlamydia or gonorrhea. Ask your health care provider if you are at risk.  If you are at risk of being infected with HIV, it is recommended that you take a prescription medicine daily to prevent HIV infection. This is  called preexposure prophylaxis (PrEP). You are considered at risk if:  You are a heterosexual woman, are sexually active, and are at increased risk for HIV infection.  You take drugs by injection.  You are sexually active with a partner who has HIV.  Talk with your health care provider about whether you are at high risk of being infected with HIV. If you choose to begin PrEP, you should first be tested for HIV. You should then be tested every 3 months for as long as you are taking PrEP.  Osteoporosis is a disease in which the bones lose minerals and strength   with aging. This can result in serious bone fractures or breaks. The risk of osteoporosis can be identified using a bone density scan. Women ages 65 years and over and women at risk for fractures or osteoporosis should discuss screening with their health care providers. Ask your health care provider whether you should take a calcium supplement or vitamin D to reduce the rate of osteoporosis.  Menopause can be associated with physical symptoms and risks. Hormone replacement therapy is available to decrease symptoms and risks. You should talk to your health care provider about whether hormone replacement therapy is right for you.  Use sunscreen. Apply sunscreen liberally and repeatedly throughout the day. You should seek shade when your shadow is shorter than you. Protect yourself by wearing long sleeves, pants, a wide-brimmed hat, and sunglasses year round, whenever you are outdoors.  Once a month, do a whole body skin exam, using a mirror to look at the skin on your back. Tell your health care provider of new moles, moles that have irregular borders, moles that are larger than a pencil eraser, or moles that have changed in shape or color.  Stay current with required vaccines (immunizations).  Influenza vaccine. All adults should be immunized every year.  Tetanus, diphtheria, and acellular pertussis (Td, Tdap) vaccine. Pregnant women should  receive 1 dose of Tdap vaccine during each pregnancy. The dose should be obtained regardless of the length of time since the last dose. Immunization is preferred during the 27th-36th week of gestation. An adult who has not previously received Tdap or who does not know her vaccine status should receive 1 dose of Tdap. This initial dose should be followed by tetanus and diphtheria toxoids (Td) booster doses every 10 years. Adults with an unknown or incomplete history of completing a 3-dose immunization series with Td-containing vaccines should begin or complete a primary immunization series including a Tdap dose. Adults should receive a Td booster every 10 years.  Varicella vaccine. An adult without evidence of immunity to varicella should receive 2 doses or a second dose if she has previously received 1 dose. Pregnant females who do not have evidence of immunity should receive the first dose after pregnancy. This first dose should be obtained before leaving the health care facility. The second dose should be obtained 4-8 weeks after the first dose.  Human papillomavirus (HPV) vaccine. Females aged 13-26 years who have not received the vaccine previously should obtain the 3-dose series. The vaccine is not recommended for use in pregnant females. However, pregnancy testing is not needed before receiving a dose. If a female is found to be pregnant after receiving a dose, no treatment is needed. In that case, the remaining doses should be delayed until after the pregnancy. Immunization is recommended for any person with an immunocompromised condition through the age of 26 years if she did not get any or all doses earlier. During the 3-dose series, the second dose should be obtained 4-8 weeks after the first dose. The third dose should be obtained 24 weeks after the first dose and 16 weeks after the second dose.  Zoster vaccine. One dose is recommended for adults aged 60 years or older unless certain conditions are  present.  Measles, mumps, and rubella (MMR) vaccine. Adults born before 1957 generally are considered immune to measles and mumps. Adults born in 1957 or later should have 1 or more doses of MMR vaccine unless there is a contraindication to the vaccine or there is laboratory evidence of immunity to   each of the three diseases. A routine second dose of MMR vaccine should be obtained at least 28 days after the first dose for students attending postsecondary schools, health care workers, or international travelers. People who received inactivated measles vaccine or an unknown type of measles vaccine during 1963-1967 should receive 2 doses of MMR vaccine. People who received inactivated mumps vaccine or an unknown type of mumps vaccine before 1979 and are at high risk for mumps infection should consider immunization with 2 doses of MMR vaccine. For females of childbearing age, rubella immunity should be determined. If there is no evidence of immunity, females who are not pregnant should be vaccinated. If there is no evidence of immunity, females who are pregnant should delay immunization until after pregnancy. Unvaccinated health care workers born before 1957 who lack laboratory evidence of measles, mumps, or rubella immunity or laboratory confirmation of disease should consider measles and mumps immunization with 2 doses of MMR vaccine or rubella immunization with 1 dose of MMR vaccine.  Pneumococcal 13-valent conjugate (PCV13) vaccine. When indicated, a person who is uncertain of her immunization history and has no record of immunization should receive the PCV13 vaccine. An adult aged 19 years or older who has certain medical conditions and has not been previously immunized should receive 1 dose of PCV13 vaccine. This PCV13 should be followed with a dose of pneumococcal polysaccharide (PPSV23) vaccine. The PPSV23 vaccine dose should be obtained at least 8 weeks after the dose of PCV13 vaccine. An adult aged 19  years or older who has certain medical conditions and previously received 1 or more doses of PPSV23 vaccine should receive 1 dose of PCV13. The PCV13 vaccine dose should be obtained 1 or more years after the last PPSV23 vaccine dose.  Pneumococcal polysaccharide (PPSV23) vaccine. When PCV13 is also indicated, PCV13 should be obtained first. All adults aged 65 years and older should be immunized. An adult younger than age 65 years who has certain medical conditions should be immunized. Any person who resides in a nursing home or long-term care facility should be immunized. An adult smoker should be immunized. People with an immunocompromised condition and certain other conditions should receive both PCV13 and PPSV23 vaccines. People with human immunodeficiency virus (HIV) infection should be immunized as soon as possible after diagnosis. Immunization during chemotherapy or radiation therapy should be avoided. Routine use of PPSV23 vaccine is not recommended for American Indians, [place] Natives, or people younger than 65 years unless there are medical conditions that require PPSV23 vaccine. When indicated, people who have unknown immunization and have no record of immunization should receive PPSV23 vaccine. One-time revaccination 5 years after the first dose of PPSV23 is recommended for people aged 19-64 years who have chronic kidney failure, nephrotic syndrome, asplenia, or immunocompromised conditions. People who received 1-2 doses of PPSV23 before age 65 years should receive another dose of PPSV23 vaccine at age 65 years or later if at least 5 years have passed since the previous dose. Doses of PPSV23 are not needed for people immunized with PPSV23 at or after age 65 years.  Meningococcal vaccine. Adults with asplenia or persistent complement component deficiencies should receive 2 doses of quadrivalent meningococcal conjugate (MenACWY-D) vaccine. The doses should be obtained at least 2 months apart.  Microbiologists working with certain meningococcal bacteria, military recruits, people at risk during an outbreak, and people who travel to or live in countries with a high rate of meningitis should be immunized. A first-year college student up through age   21 years who is living in a residence hall should receive a dose if she did not receive a dose on or after her 16th birthday. Adults who have certain high-risk conditions should receive one or more doses of vaccine.  Hepatitis A vaccine. Adults who wish to be protected from this disease, have certain high-risk conditions, work with hepatitis A-infected animals, work in hepatitis A research labs, or travel to or work in countries with a high rate of hepatitis A should be immunized. Adults who were previously unvaccinated and who anticipate close contact with an international adoptee during the first 60 days after arrival in the [place] from a country with a high rate of hepatitis A should be immunized.  Hepatitis B vaccine. Adults who wish to be protected from this disease, have certain high-risk conditions, may be exposed to blood or other infectious body fluids, are household contacts or sex partners of hepatitis B positive people, are clients or workers in certain care facilities, or travel to or work in countries with a high rate of hepatitis B should be immunized.  Haemophilus influenzae type b (Hib) vaccine. A previously unvaccinated person with asplenia or sickle cell disease or having a scheduled splenectomy should receive 1 dose of Hib vaccine. Regardless of previous immunization, a recipient of a hematopoietic stem cell transplant should receive a 3-dose series 6-12 months after her successful transplant. Hib vaccine is not recommended for adults with HIV infection. Preventive Services / Frequency Ages 64 to 68 years  Blood pressure check.** / Every 1 to 2 years.  Lipid and cholesterol check.** / Every 5 years beginning at age  22.  Clinical breast exam.** / Every 3 years for women in their 88s and 53s.  BRCA-related cancer risk assessment.** / For women who have family members with a BRCA-related cancer (breast, ovarian, tubal, or peritoneal cancers).  Pap test.** / Every 2 years from ages 90 through 51. Every 3 years starting at age 21 through age 56 or 3 with a history of 3 consecutive normal Pap tests.  HPV screening.** / Every 3 years from ages 24 through ages 1 to 46 with a history of 3 consecutive normal Pap tests.  Hepatitis C blood test.** / For any individual with known risks for hepatitis C.  Skin self-exam. / Monthly.  Influenza vaccine. / Every year.  Tetanus, diphtheria, and acellular pertussis (Tdap, Td) vaccine.** / Consult your health care provider. Pregnant women should receive 1 dose of Tdap vaccine during each pregnancy. 1 dose of Td every 10 years.  Varicella vaccine.** / Consult your health care provider. Pregnant females who do not have evidence of immunity should receive the first dose after pregnancy.  HPV vaccine. / 3 doses over 6 months, if 72 and younger. The vaccine is not recommended for use in pregnant females. However, pregnancy testing is not needed before receiving a dose.  Measles, mumps, rubella (MMR) vaccine.** / You need at least 1 dose of MMR if you were born in 1957 or later. You may also need a 2nd dose. For females of childbearing age, rubella immunity should be determined. If there is no evidence of immunity, females who are not pregnant should be vaccinated. If there is no evidence of immunity, females who are pregnant should delay immunization until after pregnancy.  Pneumococcal 13-valent conjugate (PCV13) vaccine.** / Consult your health care provider.  Pneumococcal polysaccharide (PPSV23) vaccine.** / 1 to 2 doses if you smoke cigarettes or if you have certain conditions.  Meningococcal vaccine.** /  1 dose if you are age 19 to 21 years and a first-year college  student living in a residence hall, or have one of several medical conditions, you need to get vaccinated against meningococcal disease. You may also need additional booster doses.  Hepatitis A vaccine.** / Consult your health care provider.  Hepatitis B vaccine.** / Consult your health care provider.  Haemophilus influenzae type b (Hib) vaccine.** / Consult your health care provider. Ages 40 to 64 years  Blood pressure check.** / Every 1 to 2 years.  Lipid and cholesterol check.** / Every 5 years beginning at age 20 years.  Lung cancer screening. / Every year if you are aged 55-80 years and have a 30-pack-year history of smoking and currently smoke or have quit within the past 15 years. Yearly screening is stopped once you have quit smoking for at least 15 years or develop a health problem that would prevent you from having lung cancer treatment.  Clinical breast exam.** / Every year after age 40 years.  BRCA-related cancer risk assessment.** / For women who have family members with a BRCA-related cancer (breast, ovarian, tubal, or peritoneal cancers).  Mammogram.** / Every year beginning at age 40 years and continuing for as long as you are in good health. Consult with your health care provider.  Pap test.** / Every 3 years starting at age 30 years through age 65 or 70 years with a history of 3 consecutive normal Pap tests.  HPV screening.** / Every 3 years from ages 30 years through ages 65 to 70 years with a history of 3 consecutive normal Pap tests.  Fecal occult blood test (FOBT) of stool. / Every year beginning at age 50 years and continuing until age 75 years. You may not need to do this test if you get a colonoscopy every 10 years.  Flexible sigmoidoscopy or colonoscopy.** / Every 5 years for a flexible sigmoidoscopy or every 10 years for a colonoscopy beginning at age 50 years and continuing until age 75 years.  Hepatitis C blood test.** / For all people born from 1945 through  1965 and any individual with known risks for hepatitis C.  Skin self-exam. / Monthly.  Influenza vaccine. / Every year.  Tetanus, diphtheria, and acellular pertussis (Tdap/Td) vaccine.** / Consult your health care provider. Pregnant women should receive 1 dose of Tdap vaccine during each pregnancy. 1 dose of Td every 10 years.  Varicella vaccine.** / Consult your health care provider. Pregnant females who do not have evidence of immunity should receive the first dose after pregnancy.  Zoster vaccine.** / 1 dose for adults aged 60 years or older.  Measles, mumps, rubella (MMR) vaccine.** / You need at least 1 dose of MMR if you were born in 1957 or later. You may also need a 2nd dose. For females of childbearing age, rubella immunity should be determined. If there is no evidence of immunity, females who are not pregnant should be vaccinated. If there is no evidence of immunity, females who are pregnant should delay immunization until after pregnancy.  Pneumococcal 13-valent conjugate (PCV13) vaccine.** / Consult your health care provider.  Pneumococcal polysaccharide (PPSV23) vaccine.** / 1 to 2 doses if you smoke cigarettes or if you have certain conditions.  Meningococcal vaccine.** / Consult your health care provider.  Hepatitis A vaccine.** / Consult your health care provider.  Hepatitis B vaccine.** / Consult your health care provider.  Haemophilus influenzae type b (Hib) vaccine.** / Consult your health care provider. Ages 65   years and over  Blood pressure check.** / Every 1 to 2 years.  Lipid and cholesterol check.** / Every 5 years beginning at age 22 years.  Lung cancer screening. / Every year if you are aged 73-80 years and have a 30-pack-year history of smoking and currently smoke or have quit within the past 15 years. Yearly screening is stopped once you have quit smoking for at least 15 years or develop a health problem that would prevent you from having lung cancer  treatment.  Clinical breast exam.** / Every year after age 4 years.  BRCA-related cancer risk assessment.** / For women who have family members with a BRCA-related cancer (breast, ovarian, tubal, or peritoneal cancers).  Mammogram.** / Every year beginning at age 40 years and continuing for as long as you are in good health. Consult with your health care provider.  Pap test.** / Every 3 years starting at age 9 years through age 34 or 91 years with 3 consecutive normal Pap tests. Testing can be stopped between 65 and 70 years with 3 consecutive normal Pap tests and no abnormal Pap or HPV tests in the past 10 years.  HPV screening.** / Every 3 years from ages 57 years through ages 64 or 45 years with a history of 3 consecutive normal Pap tests. Testing can be stopped between 65 and 70 years with 3 consecutive normal Pap tests and no abnormal Pap or HPV tests in the past 10 years.  Fecal occult blood test (FOBT) of stool. / Every year beginning at age 15 years and continuing until age 17 years. You may not need to do this test if you get a colonoscopy every 10 years.  Flexible sigmoidoscopy or colonoscopy.** / Every 5 years for a flexible sigmoidoscopy or every 10 years for a colonoscopy beginning at age 86 years and continuing until age 71 years.  Hepatitis C blood test.** / For all people born from [birth year] through 1967 and any individual with known risks for hepatitis C.  Osteoporosis screening.** / A one-time screening for women ages 83 years and over and women at risk for fractures or osteoporosis.  Skin self-exam. / Monthly.  Influenza vaccine. / Every year.  Tetanus, diphtheria, and acellular pertussis (Tdap/Td) vaccine.** / 1 dose of Td every 10 years.  Varicella vaccine.** / Consult your health care provider.  Zoster vaccine.** / 1 dose for adults aged 61 years or older.  Pneumococcal 13-valent conjugate (PCV13) vaccine.** / Consult your health care provider.  Pneumococcal  polysaccharide (PPSV23) vaccine.** / 1 dose for all adults aged 28 years and older.  Meningococcal vaccine.** / Consult your health care provider.  Hepatitis A vaccine.** / Consult your health care provider.  Hepatitis B vaccine.** / Consult your health care provider.  Haemophilus influenzae type b (Hib) vaccine.** / Consult your health care provider. ** Family history and personal history of risk and conditions may change your health care provider's recommendations. Document Released: 04/28/2001 Document Revised: 07/17/2013 Document Reviewed: 07/28/2010 [facility] Patient Information 2015 [place], [place]. This information is not intended to replace advice given to you by your health care provider. Make sure you discuss any questions you have with your health care provider.

## 2014-08-29 NOTE — Assessment & Plan Note (Signed)

## 2014-08-29 NOTE — Progress Notes (Signed)
Subjective:  Patient ID: Diana Reeves, female    DOB: June 05, 1950  Age: 64 y.o. MRN: [id number]  CC: Diabetes; Hypertension; and Annual Exam   HPI Diana Reeves presents for a CPX, recheck on chronic illnesses, and klonopin refill. She offers no new complaints.  Outpatient Prescriptions Prior to Visit  Medication Sig Dispense Refill  . Azelastine-Fluticasone (DYMISTA NA) Place into the nose.    . conjugated estrogens (PREMARIN) vaginal cream PLACE 1/3 APPLICATOR EVERY OTHER NIGHT 30 g 5  . NEXIUM 40 MG capsule TAKE 1 CAPSULE (40 MG TOTAL) BY MOUTH DAILY BEFORE BREAKFAST. 30 capsule 10  . terconazole (TERAZOL 3) 0.8 % vaginal cream Place 1 applicator vaginally at bedtime. For 3 nights 20 g 0  . amoxicillin-clavulanate (AUGMENTIN) 875-125 MG per tablet Take 1 tablet by mouth 2 (two) times daily. 20 tablet 0  . clobetasol ointment (TEMOVATE) 0.05 % Apply topically 2 (two) times daily. 45 g 0  . clonazePAM (KLONOPIN) 0.5 MG tablet Take 1 tablet (0.5 mg total) by mouth 3 (three) times daily as needed for anxiety. 60 tablet 1  . DULoxetine (CYMBALTA) 60 MG capsule TAKE 1 CAPSULE (60 MG TOTAL) BY MOUTH DAILY. 30 capsule 0  . HYDROcodone-homatropine (HYCODAN) 5-1.5 MG/5ML syrup Take 5 mLs by mouth at bedtime and may repeat dose one time if needed. 120 mL 0  . predniSONE (DELTASONE) 20 MG tablet Take 2 tablets (40 mg total) by mouth daily. 10 tablet 0  . zolpidem (AMBIEN) 10 MG tablet Take 1 tablet (10 mg total) by mouth at bedtime as needed for sleep. 30 tablet 2   No facility-administered medications prior to visit.    ROS Review of Systems  Constitutional: Negative.  Negative for fever, chills, diaphoresis, appetite change and fatigue.  HENT: Negative.   Eyes: Negative.   Respiratory: Negative.  Negative for cough, choking, chest tightness, shortness of breath and stridor.   Cardiovascular: Negative.  Negative for chest pain, palpitations and leg swelling.  Gastrointestinal: Negative.   Negative for nausea, vomiting, abdominal pain, diarrhea, constipation and blood in stool.  Endocrine: Negative.  Negative for polydipsia, polyphagia and polyuria.  Genitourinary: Negative.   Musculoskeletal: Negative.   Skin: Negative.  Negative for rash.  Allergic/Immunologic: Negative.   Neurological: Negative.  Negative for dizziness.  Hematological: Negative.   Psychiatric/Behavioral: Positive for sleep disturbance and dysphoric mood. Negative for suicidal ideas, hallucinations, behavioral problems, confusion, self-injury, decreased concentration and agitation. The patient is nervous/anxious. The patient is not hyperactive.     Objective:  BP 126/80 mmHg  Pulse 83  Temp(Src) 98.3 F (36.8 C) (Oral)  Resp 16  Ht 5' 5.25" (1.657 m)  Wt 202 lb (91.627 kg)  BMI 33.37 kg/m2  SpO2 96%  LMP 03/17/1991  BP Readings from Last 3 Encounters:  08/29/14 126/80  05/27/14 158/84  10/20/13 124/80    Wt Readings from Last 3 Encounters:  08/29/14 202 lb (91.627 kg)  05/27/14 209 lb 6.4 oz (94.983 kg)  10/20/13 203 lb (92.08 kg)    Physical Exam  Constitutional: She is oriented to person, place, and time. She appears well-developed and well-nourished. No distress.  HENT:  Head: Normocephalic and atraumatic.  Mouth/Throat: Oropharynx is clear and moist. No oropharyngeal exudate.  Eyes: Conjunctivae are normal. Right eye exhibits no discharge. No scleral icterus.  Neck: Normal range of motion. Neck supple. No JVD present. No tracheal deviation present. No thyromegaly present.  Cardiovascular: Normal rate, regular rhythm, normal heart sounds and intact distal  pulses.  Exam reveals no gallop and no friction rub.   No murmur heard. Pulmonary/Chest: Effort normal and breath sounds normal. No stridor. No respiratory distress. She has no wheezes. She has no rales. She exhibits no tenderness.  Abdominal: Soft. Bowel sounds are normal. She exhibits no distension and no mass. There is no  tenderness. There is no rebound and no guarding.  Musculoskeletal: Normal range of motion. She exhibits no edema or tenderness.  Lymphadenopathy:    She has no cervical adenopathy.  Neurological: She is oriented to person, place, and time.  Skin: Skin is warm and dry. No rash noted. She is not diaphoretic. No erythema. No pallor.  Psychiatric: She has a normal mood and affect. Her behavior is normal. Judgment and thought content normal. Her mood appears not anxious. Her speech is not rapid and/or pressured, not delayed and not tangential. Cognition and memory are normal. She does not exhibit a depressed mood. She expresses no homicidal and no suicidal ideation. She expresses no suicidal plans.  Vitals reviewed.   Lab Results  Component Value Date   WBC 6.1 08/29/2014   HGB 13.5 08/29/2014   HCT 40.2 08/29/2014   PLT 246.0 08/29/2014   GLUCOSE 113* 08/29/2014   CHOL 152 08/29/2014   TRIG 88.0 08/29/2014   HDL 43.80 08/29/2014   LDLCALC 91 08/29/2014   ALT 26 08/29/2014   AST 36 08/29/2014   NA 140 08/29/2014   K 4.0 08/29/2014   CL 107 08/29/2014   CREATININE 0.68 08/29/2014   BUN 18 08/29/2014   CO2 27 08/29/2014   TSH 0.94 08/29/2014   HGBA1C 6.4 08/29/2014   MICROALBUR 1.7 08/29/2014    Ct Pelvis Wo Contrast  11/23/2013   CLINICAL DATA:  Right hip and leg pain and ischial pain secondary to a fall 3 weeks ago.  EXAM: CT PELVIS WITHOUT CONTRAST  TECHNIQUE: Multidetector CT imaging of the pelvis was performed following the standard protocol without intravenous contrast.  COMPARISON:  None.  FINDINGS: There is a vertical fracture through the right sacral ala. There is also a fracture through the right ischium at the anterior aspect of the right acetabulum and through the right inferior pubic ramus. No significant displacement at any of the fracture sites. No soft tissue hematomas.  Incidental note is made of diverticulosis of the distal colon.  IMPRESSION: Fracture the right side of  the sacrum and through the right ischium at the anterior aspect of the acetabulum and through the right inferior pubic ramus. Minimal displacement.   Electronically Signed   By: Rozetta Nunnery M.D.   On: 11/23/2013 08:25    Assessment & Plan:   Diana Reeves was seen today for diabetes, hypertension and annual exam.  Diagnoses and all orders for this visit:  Type II diabetes mellitus with manifestations - the A1C shows that her blood sugars have improved, she does not need to be on a medication Orders: -     Lipid panel; Future -     Microalbumin / creatinine urine ratio; Future -     Comprehensive metabolic panel; Future -     Hemoglobin A1c; Future -     Cancel: Ambulatory referral to Ophthalmology  Essential hypertension, benign - her BP is well controlled, lytes and renal function are stable Orders: -     Comprehensive metabolic panel; Future -     CBC with Differential/Platelet; Future -     Urinalysis, Routine w reflex microscopic (not at [facility]); Future  Hyperlipidemia with  target LDL less than 100 - she has achieved her LDL goal and dose not want to take a statin Orders: -     Lipid panel; Future -     Comprehensive metabolic panel; Future -     TSH; Future  Routine general medical examination at a health care facility  Insomnia due to anxiety and fear Orders: -     clonazePAM (KLONOPIN) 0.5 MG tablet; Take 1 tablet (0.5 mg total) by mouth 3 (three) times daily as needed for anxiety. -     zolpidem (AMBIEN) 10 MG tablet; Take 1 tablet (10 mg total) by mouth at bedtime as needed for sleep.  Depression with anxiety Orders: -     clonazePAM (KLONOPIN) 0.5 MG tablet; Take 1 tablet (0.5 mg total) by mouth 3 (three) times daily as needed for anxiety.  Need for prophylactic vaccination against Streptococcus pneumoniae (pneumococcus) Orders: -     Pneumococcal conjugate vaccine 13-valent IM   I have discontinued Diana Reeves's clobetasol ointment, predniSONE, amoxicillin-clavulanate,  and HYDROcodone-homatropine. I am also having her maintain her NEXIUM, Azelastine-Fluticasone (DYMISTA NA), terconazole, conjugated estrogens, clonazePAM, and zolpidem.  Meds ordered this encounter  Medications  . clonazePAM (KLONOPIN) 0.5 MG tablet    Sig: Take 1 tablet (0.5 mg total) by mouth 3 (three) times daily as needed for anxiety.    Dispense:  60 tablet    Refill:  3  . zolpidem (AMBIEN) 10 MG tablet    Sig: Take 1 tablet (10 mg total) by mouth at bedtime as needed for sleep.    Dispense:  30 tablet    Refill:  2   See AVS for instructions about healthy living and anticipatory guidance.  Follow-up: Return in about 6 months (around 02/28/2015).  Scarlette Calico, MD

## 2014-08-29 NOTE — Progress Notes (Signed)
Pre visit review using our clinic review tool, if applicable. No additional management support is needed unless otherwise documented below in the visit note. 

## 2014-08-30 ENCOUNTER — Encounter: Payer: Self-pay | Admitting: Internal Medicine

## 2014-09-04 LAB — HM MAMMOGRAPHY

## 2014-09-13 ENCOUNTER — Encounter: Payer: Self-pay | Admitting: Internal Medicine

## 2015-02-09 ENCOUNTER — Other Ambulatory Visit: Payer: Self-pay

## 2015-02-09 DIAGNOSIS — F5105 Insomnia due to other mental disorder: Secondary | ICD-10-CM

## 2015-02-09 DIAGNOSIS — F409 Phobic anxiety disorder, unspecified: Secondary | ICD-10-CM

## 2015-02-09 DIAGNOSIS — F418 Other specified anxiety disorders: Secondary | ICD-10-CM

## 2015-02-09 NOTE — Telephone Encounter (Signed)
Incoming fax for pended meds. Ok to fill?

## 2015-02-10 MED ORDER — ZOLPIDEM TARTRATE 10 MG PO TABS: 10.0000 mg | ORAL_TABLET | Freq: Every evening | ORAL | Status: DC | PRN

## 2015-02-10 MED ORDER — CLONAZEPAM 0.5 MG PO TABS: 0.5000 mg | ORAL_TABLET | Freq: Three times a day (TID) | ORAL | Status: DC | PRN

## 2015-02-11 NOTE — Telephone Encounter (Signed)
fxd

## 2015-05-30 ENCOUNTER — Encounter: Payer: Self-pay | Admitting: Family Medicine

## 2015-05-30 ENCOUNTER — Ambulatory Visit (INDEPENDENT_AMBULATORY_CARE_PROVIDER_SITE_OTHER): Payer: BC Managed Care – PPO | Admitting: Family Medicine

## 2015-05-30 VITALS — BP 136/88 | HR 92 | Temp 98.1°F | Ht 65.25 in | Wt 210.2 lb

## 2015-05-30 DIAGNOSIS — J069 Acute upper respiratory infection, unspecified: Secondary | ICD-10-CM | POA: Diagnosis not present

## 2015-05-30 NOTE — Progress Notes (Signed)
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  Past Medical History  Diagnosis Date  . Stricture and stenosis of esophagus   . Benign neoplasm of colon   . Diverticulosis of colon (without mention of hemorrhage)   . Other chronic nonalcoholic liver disease   . Esophageal reflux   . Depression   . Degenerative cervical disc   . Glaucoma   . Basal cell carcinoma   . Osteoporosis, unspecified     2007 T score -3.0, 2012 T score -1.9, 2015 T score -2.1 overall stable     Past Surgical History  Procedure Laterality Date  . Left arm surgery  7-09  . Eye surgery  10/18/2012    lt eye  . Abdominal hysterectomy  1993    TAH-LSO-Post. repair-[facility]    Family History  Problem Relation Age of Onset  . Cancer Father     colon  . Hypertension Father   . Colon cancer Father 45  . Cancer Maternal Grandmother     uterine  . Diabetes Paternal Grandfather   . Heart disease Mother   . Cancer Son     Testicular cancer    Social History   Social History  . Marital Status: Divorced    Spouse Name: N/A  . Number of Children: N/A  . Years of Education: N/A   Social History Main Topics  . Smoking status: Former Research scientist (life sciences)  . Smokeless tobacco: Never Used  . Alcohol Use: 1.2 oz/week    2 Glasses of wine per week     Comment: occasional  . Drug Use: No  . Sexual Activity: No   Other Topics Concern  . None   Social History Narrative     Current outpatient prescriptions:  .  Azelastine-Fluticasone (DYMISTA NA), Place into the nose., Disp: , Rfl:  .   clonazePAM (KLONOPIN) 0.5 MG tablet, Take 1 tablet (0.5 mg total) by mouth 3 (three) times daily as needed for anxiety., Disp: 60 tablet, Rfl: 0 .  conjugated estrogens (PREMARIN) vaginal cream, PLACE 1/3 APPLICATOR EVERY OTHER NIGHT, Disp: 30 g, Rfl: 5 .  DULoxetine (CYMBALTA) 60 MG capsule, TAKE 1 CAPSULE (60 MG TOTAL) BY MOUTH DAILY., Disp: 30 capsule, Rfl: 11 .  NEXIUM 40 MG capsule, TAKE 1 CAPSULE (40 MG TOTAL) BY MOUTH DAILY BEFORE BREAKFAST., Disp: 30 capsule, Rfl: 10 .  zolpidem (AMBIEN) 10 MG tablet, Take 1 tablet (10 mg total) by mouth at bedtime as needed for sleep., Disp: 30 tablet, Rfl: 0  EXAM:  Filed Vitals:   05/30/15 1527  BP: 140/88  Pulse: 92  Temp: 98.1 F (36.7 C)    Body mass index is 34.73 kg/(m^2).  GENERAL: vitals reviewed and listed above, alert, oriented, appears well hydrated and in no acute distress  HEENT: atraumatic, conjunttiva clear, no obvious abnormalities on inspection of external nose and ears, normal appearance of ear canals and TMs, clear nasal congestion, mild post oropharyngeal erythema with PND, no tonsillar edema or exudate, no sinus TTP  NECK: no obvious masses on inspection  LUNGS: clear to auscultation bilaterally, no wheezes, rales or rhonchi, good air movement  CV: HRRR, no peripheral edema  MS: moves all extremities without noticeable abnormality  PSYCH: pleasant and cooperative, no obvious depression or anxiety  ASSESSMENT AND PLAN:  Discussed the following assessment and plan:  Acute upper respiratory infection  -given HPI and exam findings today, a serious infection or illness is unlikely. We discussed potential etiologies, with VURI being most likely, and advised supportive care and monitoring. We discussed treatment side effects, likely course, antibiotic misuse, transmission, and signs of developing a serious or worsening illness. -of course, we advised to return or notify a doctor immediately if symptoms worsen or persist  or new concerns arise.    Patient Instructions  INSTRUCTIONS FOR UPPER RESPIRATORY INFECTION:  -plenty of rest and fluids  -nasal saline wash 2-3 times daily (use prepackaged nasal saline or bottled/distilled water if making your own)   -can use AFRIN nasal spray for drainage and nasal congestion - but do NOT use longer then 3-4 days  -can use tylenol (in no history of liver disease) or ibuprofen (if no history of kidney disease, bowel bleeding or significant heart disease) as directed for aches and sorethroat  -in the winter time, using a humidifier at night is helpful (please follow cleaning instructions)  -if you are taking a cough medication - use only as directed, may also try a teaspoon of honey to coat the throat and throat lozenges.  -for sore throat, salt water gargles can help  -follow up if you have fevers, facial pain, tooth pain, difficulty breathing or are worsening or symptoms persist longer then expected  Upper Respiratory Infection, Adult An upper respiratory infection (URI) is also known as the common cold. It is often caused by a type of germ (virus). Colds are easily spread (contagious). You can pass it to others by kissing, coughing, sneezing, or drinking out of the same glass. Usually, you get better in 1 to 3  weeks.  However, the cough can last for even longer. HOME CARE   Only take medicine as told by your doctor. Follow instructions provided above.  Drink enough water and fluids to keep your pee (urine) clear or pale yellow.  Get plenty of rest.  Return to work when your temperature is < 100 for 24 hours or as told by your doctor. You may use a face mask and wash your hands to stop your cold from spreading. GET HELP RIGHT AWAY IF:   After the first few days, you feel you are getting worse.  You have questions about your medicine.  You have chills, shortness of breath, or red spit (mucus).  You have pain in the face for more then 1-2 days, especially  when you bend forward.  You have a fever, puffy (swollen) neck, pain when you swallow, or white spots in the back of your throat.  You have a bad headache, ear pain, sinus pain, or chest pain.  You have a high-pitched whistling sound when you breathe in and out (wheezing).  You cough up blood.  You have sore muscles or a stiff neck. MAKE SURE YOU:   Understand these instructions.  Will watch your condition.  Will get help right away if you are not doing well or get worse. Document Released: 08/19/2007 Document Revised: 05/25/2011 Document Reviewed: 06/07/2013 [facility] Patient Information 2015 [place], [place]. This information is not intended to replace advice given to you by your health care provider. Make sure you discuss any questions you have with your health care provider.  Colin Benton R.

## 2015-05-30 NOTE — Progress Notes (Signed)
Pre visit review using our clinic review tool, if applicable. No additional management support is needed unless otherwise documented below in the visit note. 

## 2015-05-30 NOTE — Patient Instructions (Addendum)
INSTRUCTIONS FOR UPPER RESPIRATORY INFECTION:  -plenty of rest and fluids  -nasal saline wash 2-3 times daily (use prepackaged nasal saline or bottled/distilled water if making your own)   -can use AFRIN nasal spray for drainage and nasal congestion - but do NOT use longer then 3-4 days  -can use tylenol (in no history of liver disease) or ibuprofen (if no history of kidney disease, bowel bleeding or significant heart disease) as directed for aches and sorethroat  -in the winter time, using a humidifier at night is helpful (please follow cleaning instructions)  -if you are taking a cough medication - use only as directed, may also try a teaspoon of honey to coat the throat and throat lozenges.  -for sore throat, salt water gargles can help  -follow up if you have fevers, facial pain, tooth pain, difficulty breathing or are worsening or symptoms persist longer then expected  Upper Respiratory Infection, Adult An upper respiratory infection (URI) is also known as the common cold. It is often caused by a type of germ (virus). Colds are easily spread (contagious). You can pass it to others by kissing, coughing, sneezing, or drinking out of the same glass. Usually, you get better in 1 to 3  weeks.  However, the cough can last for even longer. HOME CARE   Only take medicine as told by your doctor. Follow instructions provided above.  Drink enough water and fluids to keep your pee (urine) clear or pale yellow.  Get plenty of rest.  Return to work when your temperature is < 100 for 24 hours or as told by your doctor. You may use a face mask and wash your hands to stop your cold from spreading. GET HELP RIGHT AWAY IF:   After the first few days, you feel you are getting worse.  You have questions about your medicine.  You have chills, shortness of breath, or red spit (mucus).  You have pain in the face for more then 1-2 days, especially when you bend forward.  You have a fever, puffy  (swollen) neck, pain when you swallow, or white spots in the back of your throat.  You have a bad headache, ear pain, sinus pain, or chest pain.  You have a high-pitched whistling sound when you breathe in and out (wheezing).  You cough up blood.  You have sore muscles or a stiff neck. MAKE SURE YOU:   Understand these instructions.  Will watch your condition.  Will get help right away if you are not doing well or get worse. Document Released: 08/19/2007 Document Revised: 05/25/2011 Document Reviewed: 06/07/2013 [facility] Patient Information 2015 [place], [place]. This information is not intended to replace advice given to you by your health care provider. Make sure you discuss any questions you have with your health care provider.

## 2015-06-14 ENCOUNTER — Other Ambulatory Visit: Payer: Self-pay

## 2015-06-18 ENCOUNTER — Other Ambulatory Visit: Payer: Self-pay | Admitting: *Deleted

## 2015-06-21 ENCOUNTER — Other Ambulatory Visit: Payer: Self-pay

## 2015-06-24 ENCOUNTER — Other Ambulatory Visit: Payer: Self-pay

## 2015-07-01 ENCOUNTER — Telehealth: Payer: Self-pay | Admitting: *Deleted

## 2015-07-01 NOTE — Telephone Encounter (Signed)
Pt called asking why premarin vaginal cream was denied, pt advised overdue for annual exam to call and schedule.

## 2015-07-05 ENCOUNTER — Telehealth: Payer: Self-pay | Admitting: *Deleted

## 2015-07-05 MED ORDER — ESTROGENS, CONJUGATED 0.625 MG/GM VA CREA: TOPICAL_CREAM | VAGINAL | Status: DC

## 2015-07-05 NOTE — Telephone Encounter (Signed)
Okay to refill one tube

## 2015-07-05 NOTE — Telephone Encounter (Signed)
Pt called requesting refill on premarin vaginal cream, last annual in 10/2013. Pt is scheduled on 08/28/15 pt said she is very uncomfortable without medication. Okay to refill?

## 2015-07-05 NOTE — Telephone Encounter (Signed)
Left on voicemail Rx sent

## 2015-07-17 ENCOUNTER — Encounter: Payer: Self-pay | Admitting: Women's Health

## 2015-07-17 ENCOUNTER — Ambulatory Visit (INDEPENDENT_AMBULATORY_CARE_PROVIDER_SITE_OTHER): Payer: BC Managed Care – PPO | Admitting: Women's Health

## 2015-07-17 VITALS — BP 150/80 | Ht 65.0 in | Wt 210.0 lb

## 2015-07-17 DIAGNOSIS — N898 Other specified noninflammatory disorders of vagina: Secondary | ICD-10-CM | POA: Diagnosis not present

## 2015-07-17 DIAGNOSIS — N952 Postmenopausal atrophic vaginitis: Secondary | ICD-10-CM

## 2015-07-17 DIAGNOSIS — R350 Frequency of micturition: Secondary | ICD-10-CM

## 2015-07-17 DIAGNOSIS — B373 Candidiasis of vulva and vagina: Secondary | ICD-10-CM

## 2015-07-17 DIAGNOSIS — B3731 Acute candidiasis of vulva and vagina: Secondary | ICD-10-CM

## 2015-07-17 LAB — URINALYSIS W MICROSCOPIC + REFLEX CULTURE
Bilirubin Urine: NEGATIVE
CASTS: NONE SEEN [LPF]
CRYSTALS: NONE SEEN [HPF]
GLUCOSE, UA: NEGATIVE
HGB URINE DIPSTICK: NEGATIVE
KETONES UR: NEGATIVE
Leukocytes, UA: NEGATIVE
Nitrite: NEGATIVE
PROTEIN: NEGATIVE
RBC / HPF: NONE SEEN RBC/HPF (ref <=2)
Specific Gravity, Urine: >1.03 (ref 1.001–1.035)
WBC UA: NONE SEEN WBC/HPF (ref <=5)
YEAST: NONE SEEN [HPF]
pH: 5 (ref 5.0–8.0)

## 2015-07-17 LAB — WET PREP FOR TRICH, YEAST, CLUE
CLUE CELLS WET PREP: NONE SEEN
Trich, Wet Prep: NONE SEEN

## 2015-07-17 MED ORDER — ESTROGENS, CONJUGATED 0.625 MG/GM VA CREA: TOPICAL_CREAM | VAGINAL | Status: DC

## 2015-07-17 MED ORDER — TERCONAZOLE 0.8 % VA CREA: 1.0000 | TOPICAL_CREAM | Freq: Every day | VAGINAL | Status: DC

## 2015-07-17 NOTE — Progress Notes (Signed)
Patient ID: Diana Reeves, female   DOB: Jul 14, 1950, 65 y.o.   MRN: [id number] Presents with complaint of vaginal discharge with irritation and itching started after 2 weeks if not using Premarin vaginal cream. Had been on vaginal Premarin cream twice weekly with good relief of vaginal irritation in the past. Overdue for annual, ran out of prescription, reports a communication problem between the pharmacy and our office with refill request. Started back on vaginal cream daily for the past week without relief. Hysterectomy with sling,  rectocele. Not sexually active. 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  Exam: Appears well. External genitalia +2 rectocele, erythematous at introitus, speculum exam vaginal walls mild erythema, wet prep positive for few yeast. UA: Negative  Yeast vaginitis  Plan: Continue Premarin vaginal cream twice weekly, keep schedule annual exam with Dr Phineas Real. Terazol 3 prescription, proper use given, instructed to call if no relief of symptoms. Urine culture pending.

## 2015-07-17 NOTE — Patient Instructions (Signed)

## 2015-07-19 LAB — URINE CULTURE: Colony Count: 30000

## 2015-08-28 ENCOUNTER — Ambulatory Visit (INDEPENDENT_AMBULATORY_CARE_PROVIDER_SITE_OTHER): Payer: BC Managed Care – PPO | Admitting: Gynecology

## 2015-08-28 ENCOUNTER — Encounter: Payer: Self-pay | Admitting: Gynecology

## 2015-08-28 VITALS — BP 124/80 | Ht 66.0 in | Wt 209.0 lb

## 2015-08-28 DIAGNOSIS — M8580 Other specified disorders of bone density and structure, unspecified site: Secondary | ICD-10-CM | POA: Diagnosis not present

## 2015-08-28 DIAGNOSIS — N952 Postmenopausal atrophic vaginitis: Secondary | ICD-10-CM

## 2015-08-28 DIAGNOSIS — N898 Other specified noninflammatory disorders of vagina: Secondary | ICD-10-CM

## 2015-08-28 DIAGNOSIS — N3941 Urge incontinence: Secondary | ICD-10-CM | POA: Diagnosis not present

## 2015-08-28 DIAGNOSIS — Z01419 Encounter for gynecological examination (general) (routine) without abnormal findings: Secondary | ICD-10-CM

## 2015-08-28 LAB — WET PREP FOR TRICH, YEAST, CLUE
Clue Cells Wet Prep HPF POC: NONE SEEN
Trich, Wet Prep: NONE SEEN

## 2015-08-28 MED ORDER — ESTROGENS, CONJUGATED 0.625 MG/GM VA CREA: TOPICAL_CREAM | VAGINAL | Status: DC

## 2015-08-28 MED ORDER — DARIFENACIN HYDROBROMIDE ER 7.5 MG PO TB24: 7.5000 mg | ORAL_TABLET | Freq: Every day | ORAL | Status: DC

## 2015-08-28 MED ORDER — FLUCONAZOLE 150 MG PO TABS: 150.0000 mg | ORAL_TABLET | Freq: Every day | ORAL | Status: DC

## 2015-08-28 NOTE — Progress Notes (Signed)
    Diana Reeves 12-11-1950 [id number]        65 y.o.  G3P3003  for annual exam.  Several issues noted below.  Past medical history,surgical history, problem list, medications, allergies, family history and social history were all reviewed and documented as reviewed in the EPIC chart.  ROS:  Performed with pertinent positives and negatives included in the history, assessment and plan.   Additional significant findings :  None   Exam: [name] assistant Filed Vitals:   08/28/15 1537  BP: 124/80  Height: 5' 6"  (1.676 m)  Weight: 209 lb (94.802 kg)   General appearance:  Normal affect, orientation and appearance. Skin: Grossly normal HEENT: Without gross lesions.  No cervical or supraclavicular adenopathy. Thyroid normal.  Lungs:  Clear without wheezing, rales or rhonchi Cardiac: RR, without RMG Abdominal:  Soft, nontender, without masses, guarding, rebound, organomegaly or hernia Breasts:  Examined lying and sitting without masses, retractions, discharge or axillary adenopathy. Pelvic:  Ext/BUS/vagina With atrophic changes. White discharge noted. Mild rectocele noted.  Adnexa without masses or tenderness    Anus and perineum normal   Rectovaginal normal sphincter tone without palpated masses or tenderness.    Assessment/Plan:  65 y.o. G3P3003 female for annual exam.   1. Postmenopausal/atrophic genital changes. Status post TAH/LSO posterior repair in the past. Uses Premarin vaginal cream 3 times weekly for vaginal dryness. We talked about the risks to include absorption with systemic effects such as stroke heart attack DVT and breast cancer. Patient's comfortable with this and wants to continue. Refill 1 year provided. 2. Vaginal irritation. Patient notes chronic irritation it comes and goes. She does have yeast today on wet prep. Will treat with Diflucan 150 mg 3 doses and then weekly 2 months as a suppression. Follow up if symptoms persist. 3. Urinary urgency. Having  issues with urinary urgency to include bouts of incontinence. Check urinalysis today. Options for OAB management reviewed. Reviewed dietary modification. She wants a trial of medication. Side effect profiles reviewed. Enablex 7.5 mg daily #30 with 12 refills provided. Patient will call still having significant symptoms for dosage adjustment. 4. Pap smear 2015. No Pap smear done today. No history of significant abnormal Pap smears. Status post hysterectomy for benign indications and over the age of 65. Options to stop screening discussed and will be reviewed annually. 5. Mammography coming due patient has scheduled. SBE monthly reviewed. 6. Osteoporosis.  DEXA 2015 T score -2.1 overall stable from prior DEXA. History of Actonel for 5 years discontinued 2012. Plan repeat DEXA next year. 7. Health maintenance. No routine blood work done as patient does this through her primary physician's office. Follow up one year, sooner as needed.   Anastasio Auerbach MD, 4:15 PM 08/28/2015

## 2015-08-28 NOTE — Patient Instructions (Signed)
Continue the Premarin vaginal cream 2-3 times weekly Take the Diflucan pill daily for 3 days and then 1 pill each week until gone for approximately 2 months.  Start the Enablex 1 pill daily. After a month or so her first oh having issues as far as urgency call and I can increase her dose

## 2015-08-29 ENCOUNTER — Other Ambulatory Visit: Payer: Self-pay | Admitting: Internal Medicine

## 2015-08-29 LAB — URINALYSIS W MICROSCOPIC + REFLEX CULTURE
BILIRUBIN URINE: NEGATIVE
Casts: NONE SEEN [LPF]
GLUCOSE, UA: NEGATIVE
KETONES UR: NEGATIVE
Nitrite: POSITIVE — AB
SQUAMOUS EPITHELIAL / LPF: NONE SEEN [HPF] (ref <=5)
Specific Gravity, Urine: 1.022 (ref 1.001–1.035)
Yeast: NONE SEEN [HPF]
pH: 5 (ref 5.0–8.0)

## 2015-08-30 ENCOUNTER — Other Ambulatory Visit: Payer: Self-pay | Admitting: Gynecology

## 2015-08-30 DIAGNOSIS — N3001 Acute cystitis with hematuria: Secondary | ICD-10-CM

## 2015-08-30 MED ORDER — NITROFURANTOIN MONOHYD MACRO 100 MG PO CAPS: 100.0000 mg | ORAL_CAPSULE | Freq: Two times a day (BID) | ORAL | Status: DC

## 2015-08-31 LAB — URINE CULTURE: Colony Count: >=100000

## 2015-09-05 LAB — HM MAMMOGRAPHY

## 2015-09-06 ENCOUNTER — Encounter: Payer: Self-pay | Admitting: Internal Medicine

## 2015-09-26 ENCOUNTER — Other Ambulatory Visit: Payer: Self-pay | Admitting: Internal Medicine

## 2015-09-30 ENCOUNTER — Other Ambulatory Visit: Payer: BC Managed Care – PPO

## 2015-09-30 DIAGNOSIS — N3001 Acute cystitis with hematuria: Secondary | ICD-10-CM

## 2015-10-01 ENCOUNTER — Other Ambulatory Visit: Payer: Self-pay | Admitting: *Deleted

## 2015-10-01 LAB — URINALYSIS W MICROSCOPIC + REFLEX CULTURE
BILIRUBIN URINE: NEGATIVE
Bacteria, UA: NONE SEEN [HPF]
Crystals: NONE SEEN [HPF]
GLUCOSE, UA: NEGATIVE
HGB URINE DIPSTICK: NEGATIVE
LEUKOCYTES UA: NEGATIVE
NITRITE: NEGATIVE
PH: 5 (ref 5.0–8.0)
Protein, ur: NEGATIVE
Specific Gravity, Urine: 1.027 (ref 1.001–1.035)
Yeast: NONE SEEN [HPF]

## 2015-10-01 MED ORDER — DULOXETINE HCL 60 MG PO CPEP: 60.0000 mg | ORAL_CAPSULE | Freq: Every day | ORAL | Status: DC

## 2015-10-01 NOTE — Telephone Encounter (Signed)
Left msg on triage stating she has made appt for 8/22, but she will be out of her Cymbalta requesting refill to be sent to [facility]. Called pt back inform will send 30 day until appt...Diana Reeves

## 2015-10-02 LAB — URINE CULTURE

## 2015-10-03 ENCOUNTER — Ambulatory Visit (INDEPENDENT_AMBULATORY_CARE_PROVIDER_SITE_OTHER): Payer: BC Managed Care – PPO | Admitting: Family Medicine

## 2015-10-03 ENCOUNTER — Encounter: Payer: Self-pay | Admitting: Family Medicine

## 2015-10-03 VITALS — BP 136/82 | HR 76 | Temp 98.2°F | Ht 66.0 in | Wt 204.3 lb

## 2015-10-03 DIAGNOSIS — R05 Cough: Secondary | ICD-10-CM | POA: Diagnosis not present

## 2015-10-03 DIAGNOSIS — R059 Cough, unspecified: Secondary | ICD-10-CM

## 2015-10-03 MED ORDER — AZITHROMYCIN 250 MG PO TABS: ORAL_TABLET | ORAL | Status: DC

## 2015-10-03 MED ORDER — BENZONATATE 100 MG PO CAPS: 100.0000 mg | ORAL_CAPSULE | Freq: Three times a day (TID) | ORAL | Status: DC

## 2015-10-03 NOTE — Patient Instructions (Signed)
Mucinex DM can be used for cough and benzonatate for cough that is not controlled with Mucinex DM. If symptoms do not improve, worsen, or you develop a fever >101, please follow up with your PCP.

## 2015-10-03 NOTE — Addendum Note (Signed)
Addended by: Delano Metz A on: 10/03/2015 02:39 PM   Modules accepted: Level of Service

## 2015-10-03 NOTE — Progress Notes (Signed)
Pre visit review using our clinic review tool, if applicable. No additional management support is needed unless otherwise documented below in the visit note. 

## 2015-10-03 NOTE — Progress Notes (Signed)
Subjective:    Patient ID: Diana Reeves, female    DOB: Aug 14, 1950, 65 y.o.   MRN: [id number]  HPI  Diana Reeves is a 65 year old female who presents today with a productive cough which has been present for a week. Associated symptoms of rhinitis, sinus pressure/pain, and ear pressure.  Cough is productive in the evening and at bedtime and she reports that she is not able to visualize sputum. Denies fever, chills, sweats, asthma/bronchitis, history of sick contact exposure, or recent antibiotic use. She does report a history of "asthma like symptoms" and recent evaluation by an allergist. Treatment at home include ibuprofen and dymista which has provided moderate benefit. No aggravating factors noted.   Review of Systems  Constitutional: Negative for fever, chills and fatigue.  HENT: Positive for postnasal drip, rhinorrhea and sinus pressure. Negative for congestion and sore throat.   Eyes: Negative for visual disturbance.  Respiratory: Positive for cough. Negative for shortness of breath and wheezing.   Cardiovascular: Negative for chest pain, palpitations and leg swelling.  Gastrointestinal: Negative for nausea, vomiting, abdominal pain and diarrhea.  Endocrine: Negative for polydipsia, polyphagia and polyuria.  Genitourinary: Negative for dysuria, frequency and hematuria.  Musculoskeletal: Negative for myalgias.  Skin: Negative for rash.  Neurological: Negative for dizziness, seizures, syncope, weakness, light-headedness, numbness and headaches.  Psychiatric/Behavioral:       Denies depressed or anxious mood today   Past Medical History  Diagnosis Date  . Stricture and stenosis of esophagus   . Benign neoplasm of colon   . Diverticulosis of colon (without mention of hemorrhage)   . Other chronic nonalcoholic liver disease   . Esophageal reflux   . Depression   . Degenerative cervical disc   . Glaucoma   . Basal cell carcinoma   . Osteoporosis, unspecified     2007 T score  -3.0, 2012 T score -1.9, 2015 T score -2.1 overall stable      Social History   Social History  . Marital Status: Divorced    Spouse Name: N/A  . Number of Children: N/A  . Years of Education: N/A   Occupational History  . Not on file.   Social History Main Topics  . Smoking status: Former Research scientist (life sciences)  . Smokeless tobacco: Never Used  . Alcohol Use: 1.2 oz/week    2 Glasses of wine per week     Comment: occasional  . Drug Use: No  . Sexual Activity: Not Currently     Comment: 1st intercourse 65 yo-More than 5 partners   Other Topics Concern  . Not on file   Social History Narrative    Past Surgical History  Procedure Laterality Date  . Left arm surgery  7-09  . Eye surgery  10/18/2012    lt eye  . Abdominal hysterectomy  1993    TAH-LSO-Post. repair-[facility]    Family History  Problem Relation Age of Onset  . Cancer Father     colon  . Hypertension Father   . Colon cancer Father 19  . Cancer Maternal Grandmother     uterine  . Diabetes Paternal Grandfather   . Heart disease Mother   . Cancer Son     Testicular cancer    Allergies  Allergen Reactions  . Dust Mite Extract   . Mold Extract [Trichophyton Mentagrophyte]     Current Outpatient Prescriptions on File Prior to Visit  Medication Sig Dispense Refill  . clonazePAM (KLONOPIN) 0.5 MG tablet Take 1 tablet (  0.5 mg total) by mouth 3 (three) times daily as needed for anxiety. 60 tablet 0  . conjugated estrogens (PREMARIN) vaginal cream PLACE 1/3 APPLICATOR EVERY OTHER NIGHT 90 g 4  . darifenacin (ENABLEX) 7.5 MG 24 hr tablet Take 1 tablet (7.5 mg total) by mouth daily. 30 tablet 12  . DULoxetine (CYMBALTA) 60 MG capsule Take 1 capsule (60 mg total) by mouth daily. Must keep 11/05/15 appt for future refills 30 capsule 0  . NEXIUM 40 MG capsule TAKE 1 CAPSULE (40 MG TOTAL) BY MOUTH DAILY BEFORE BREAKFAST. 30 capsule 10  . nitrofurantoin, macrocrystal-monohydrate, (MACROBID) 100 MG capsule Take 1 capsule (100 mg  total) by mouth 2 (two) times daily. 14 capsule 0  . timolol (TIMOPTIC) 0.5 % ophthalmic solution as directed.    . zolpidem (AMBIEN) 10 MG tablet Take 1 tablet (10 mg total) by mouth at bedtime as needed for sleep. 30 tablet 0   No current facility-administered medications on file prior to visit.    BP 136/82 mmHg  Pulse 76  Temp(Src) 98.2 F (36.8 C) (Oral)  Ht 5' 6"  (1.676 m)  Wt 204 lb 4.8 oz (92.67 kg)  BMI 32.99 kg/m2  SpO2 96%  LMP 03/17/1991      Objective:   Physical Exam  Constitutional: She is oriented to person, place, and time. She appears well-developed and well-nourished.  HENT:  Right Ear: Tympanic membrane normal.  Left Ear: Tympanic membrane normal.  Nose: Rhinorrhea present. Right sinus exhibits no maxillary sinus tenderness and no frontal sinus tenderness. Left sinus exhibits no maxillary sinus tenderness and no frontal sinus tenderness.  Mouth/Throat: Mucous membranes are normal. No oropharyngeal exudate or posterior oropharyngeal erythema.  Eyes: Pupils are equal, round, and reactive to light. No scleral icterus.  Neck: Neck supple.  Cardiovascular: Normal rate, regular rhythm and intact distal pulses.   Pulmonary/Chest: Effort normal. She has wheezes.  Scattered wheezes present and repetitive coughing during exam.  Abdominal: Soft. Bowel sounds are normal. There is no tenderness.  Lymphadenopathy:    She has cervical adenopathy.  Neurological: She is alert and oriented to person, place, and time. Coordination normal.  Skin: Skin is warm and dry. No rash noted.  Psychiatric: She has a normal mood and affect. Her behavior is normal. Judgment and thought content normal.        Assessment & Plan:  1. Cough Cough is likely viral in origin, however with cough not improving, wheezing present, symptoms lasting one week, repetitive coughing during exam, and patient going out of town in 2 days; written prescription for azithromycin is provided. We discussed  the risks versus benefits of antibiotic therapy. Advised patient to use supportive measures and treatment for cough as her symptoms are likely viral in nature. Advised her if symptoms do not improve in 48 to 72 hours then she can fill the prescription if needed. Discussed option of a short course of prednisone with patient however she reports that she has used this before and did not "like the way it made her feel"  Patient agreed to plan of supportive measures first and azithromycin if symptoms do not improve.  - benzonatate (TESSALON) 100 MG capsule; Take 1 capsule (100 mg total) by mouth 3 (three) times daily.  Dispense: 20 capsule; Refill: 0 - azithromycin (ZITHROMAX) 250 MG tablet; Take 2 tablets by mouth today and then one tablet daily for 4 days.  Dispense: 6 tablet; Refill: 0  Advised follow up with her PCP as recommended for her  scheduled physical and also allergist who is currently evaluating and treating her.  Delano Metz, FNP-C

## 2015-10-14 ENCOUNTER — Telehealth: Payer: Self-pay | Admitting: *Deleted

## 2015-10-14 NOTE — Telephone Encounter (Signed)
Per result note on 10/03/15 "Tell patient she still has a little red cells in her urine. Better than before but still some. Recommend urology referral reference microscopic hematuria"  Referral will be faxed they will fax me back with time and date of appointment to relay.

## 2015-10-16 LAB — HM DIABETES EYE EXAM

## 2015-10-21 NOTE — Telephone Encounter (Signed)
Appointment on 12/11/15 @ 10:45am  With Dr.Budzyn  Pt aware with time and date by [facility]

## 2015-10-28 ENCOUNTER — Other Ambulatory Visit: Payer: Self-pay | Admitting: Internal Medicine

## 2015-11-05 ENCOUNTER — Encounter: Payer: Self-pay | Admitting: Internal Medicine

## 2015-11-05 ENCOUNTER — Ambulatory Visit (INDEPENDENT_AMBULATORY_CARE_PROVIDER_SITE_OTHER): Payer: Medicare Other | Admitting: Internal Medicine

## 2015-11-05 ENCOUNTER — Other Ambulatory Visit (INDEPENDENT_AMBULATORY_CARE_PROVIDER_SITE_OTHER): Payer: Medicare Other

## 2015-11-05 VITALS — BP 138/88 | HR 78 | Temp 97.9°F | Resp 16 | Ht 66.0 in | Wt 200.2 lb

## 2015-11-05 DIAGNOSIS — I10 Essential (primary) hypertension: Secondary | ICD-10-CM | POA: Diagnosis not present

## 2015-11-05 DIAGNOSIS — E118 Type 2 diabetes mellitus with unspecified complications: Secondary | ICD-10-CM

## 2015-11-05 DIAGNOSIS — E785 Hyperlipidemia, unspecified: Secondary | ICD-10-CM | POA: Diagnosis not present

## 2015-11-05 DIAGNOSIS — Z23 Encounter for immunization: Secondary | ICD-10-CM | POA: Diagnosis not present

## 2015-11-05 DIAGNOSIS — IMO0001 Reserved for inherently not codable concepts without codable children: Secondary | ICD-10-CM

## 2015-11-05 DIAGNOSIS — Z0000 Encounter for general adult medical examination without abnormal findings: Secondary | ICD-10-CM | POA: Diagnosis not present

## 2015-11-05 DIAGNOSIS — K219 Gastro-esophageal reflux disease without esophagitis: Secondary | ICD-10-CM

## 2015-11-05 LAB — URINALYSIS, ROUTINE W REFLEX MICROSCOPIC
BILIRUBIN URINE: NEGATIVE
Hgb urine dipstick: NEGATIVE
KETONES UR: NEGATIVE
Nitrite: NEGATIVE
PH: 5.5 (ref 5.0–8.0)
RBC / HPF: NONE SEEN (ref 0–?)
Specific Gravity, Urine: >=1.03 — AB (ref 1.000–1.030)
TOTAL PROTEIN, URINE-UPE24: NEGATIVE
UROBILINOGEN UA: 0.2 (ref 0.0–1.0)
Urine Glucose: NEGATIVE

## 2015-11-05 LAB — MICROALBUMIN / CREATININE URINE RATIO
Creatinine,U: 352.8 mg/dL
MICROALB/CREAT RATIO: 0.5 mg/g (ref 0.0–30.0)
Microalb, Ur: 1.6 mg/dL (ref 0.0–1.9)

## 2015-11-05 LAB — HEMOGLOBIN A1C: Hgb A1c MFr Bld: 6.7 % — ABNORMAL HIGH (ref 4.6–6.5)

## 2015-11-05 LAB — CBC WITH DIFFERENTIAL/PLATELET
Basophils Absolute: 0 10*3/uL (ref 0.0–0.1)
Basophils Relative: 0.2 % (ref 0.0–3.0)
EOS PCT: 1.8 % (ref 0.0–5.0)
Eosinophils Absolute: 0.1 10*3/uL (ref 0.0–0.7)
HEMATOCRIT: 41.4 % (ref 36.0–46.0)
HEMOGLOBIN: 13.9 g/dL (ref 12.0–15.0)
Lymphocytes Relative: 38 % (ref 12.0–46.0)
Lymphs Abs: 2.9 10*3/uL (ref 0.7–4.0)
MCHC: 33.6 g/dL (ref 30.0–36.0)
MCV: 82.6 fl (ref 78.0–100.0)
MONOS PCT: 5.4 % (ref 3.0–12.0)
Monocytes Absolute: 0.4 10*3/uL (ref 0.1–1.0)
Neutro Abs: 4.2 10*3/uL (ref 1.4–7.7)
Neutrophils Relative %: 54.6 % (ref 43.0–77.0)
Platelets: 316 10*3/uL (ref 150.0–400.0)
RBC: 5.01 Mil/uL (ref 3.87–5.11)
RDW: 15.2 % (ref 11.5–15.5)
WBC: 7.8 10*3/uL (ref 4.0–10.5)

## 2015-11-05 LAB — LIPID PANEL
Cholesterol: 183 mg/dL (ref 0–200)
HDL: 48.6 mg/dL (ref >39.00)
LDL CALC: 103 mg/dL — AB (ref 0–99)
NONHDL: 134.74
Total CHOL/HDL Ratio: 4
Triglycerides: 158 mg/dL — ABNORMAL HIGH (ref 0.0–149.0)
VLDL: 31.6 mg/dL (ref 0.0–40.0)

## 2015-11-05 LAB — COMPREHENSIVE METABOLIC PANEL
ALBUMIN: 4.4 g/dL (ref 3.5–5.2)
ALK PHOS: 67 U/L (ref 39–117)
ALT: 19 U/L (ref 0–35)
AST: 21 U/L (ref 0–37)
BUN: 12 mg/dL (ref 6–23)
CO2: 26 mEq/L (ref 19–32)
Calcium: 9 mg/dL (ref 8.4–10.5)
Chloride: 106 mEq/L (ref 96–112)
Creatinine, Ser: 0.71 mg/dL (ref 0.40–1.20)
GFR: 87.16 mL/min (ref >60.00)
GLUCOSE: 141 mg/dL — AB (ref 70–99)
POTASSIUM: 3.9 meq/L (ref 3.5–5.1)
Sodium: 141 mEq/L (ref 135–145)
TOTAL PROTEIN: 7.3 g/dL (ref 6.0–8.3)
Total Bilirubin: 0.5 mg/dL (ref 0.2–1.2)

## 2015-11-05 LAB — TSH: TSH: 1.13 u[IU]/mL (ref 0.35–4.50)

## 2015-11-05 NOTE — Patient Instructions (Signed)
Preventive Care for Adults, Female A healthy lifestyle and preventive care can promote health and wellness. Preventive health guidelines for women include the following key practices.  A routine yearly physical is a good way to check with your health care provider about your health and preventive screening. It is a chance to share any concerns and updates on your health and to receive a thorough exam.  Visit your dentist for a routine exam and preventive care every 6 months. Brush your teeth twice a day and floss once a day. Good oral hygiene prevents tooth decay and gum disease.  The frequency of eye exams is based on your age, health, family medical history, use of contact lenses, and other factors. Follow your health care provider's recommendations for frequency of eye exams.  Eat a healthy diet. Foods like vegetables, fruits, whole grains, low-fat dairy products, and lean protein foods contain the nutrients you need without too many calories. Decrease your intake of foods high in solid fats, added sugars, and salt. Eat the right amount of calories for you.Get information about a proper diet from your health care provider, if necessary.  Regular physical exercise is one of the most important things you can do for your health. Most adults should get at least 150 minutes of moderate-intensity exercise (any activity that increases your heart rate and causes you to sweat) each week. In addition, most adults need muscle-strengthening exercises on 2 or more days a week.  Maintain a healthy weight. The body mass index (BMI) is a screening tool to identify possible weight problems. It provides an estimate of body fat based on height and weight. Your health care provider can find your BMI and can help you achieve or maintain a healthy weight.For adults 20 years and older:  A BMI below 18.5 is considered underweight.  A BMI of 18.5 to 24.9 is normal.  A BMI of 25 to 29.9 is considered overweight.  A  BMI of 30 and above is considered obese.  Maintain normal blood lipids and cholesterol levels by exercising and minimizing your intake of saturated fat. Eat a balanced diet with plenty of fruit and vegetables. Blood tests for lipids and cholesterol should begin at age 45 and be repeated every 5 years. If your lipid or cholesterol levels are high, you are over 50, or you are at high risk for heart disease, you may need your cholesterol levels checked more frequently.Ongoing high lipid and cholesterol levels should be treated with medicines if diet and exercise are not working.  If you smoke, find out from your health care provider how to quit. If you do not use tobacco, do not start.  Lung cancer screening is recommended for adults aged 45-80 years who are at high risk for developing lung cancer because of a history of smoking. A yearly low-dose CT scan of the lungs is recommended for people who have at least a 30-pack-year history of smoking and are a current smoker or have quit within the past 15 years. A pack year of smoking is smoking an average of 1 pack of cigarettes a day for 1 year (for example: 1 pack a day for 30 years or 2 packs a day for 15 years). Yearly screening should continue until the smoker has stopped smoking for at least 15 years. Yearly screening should be stopped for people who develop a health problem that would prevent them from having lung cancer treatment.  If you are pregnant, do not drink alcohol. If you are  breastfeeding, be very cautious about drinking alcohol. If you are not pregnant and choose to drink alcohol, do not have more than 1 drink per day. One drink is considered to be 12 ounces (355 mL) of beer, 5 ounces (148 mL) of wine, or 1.5 ounces (44 mL) of liquor.  Avoid use of street drugs. Do not share needles with anyone. Ask for help if you need support or instructions about stopping the use of drugs.  High blood pressure causes heart disease and increases the risk  of stroke. Your blood pressure should be checked at least every 1 to 2 years. Ongoing high blood pressure should be treated with medicines if weight loss and exercise do not work.  If you are 55-79 years old, ask your health care provider if you should take aspirin to prevent strokes.  Diabetes screening is done by taking a blood sample to check your blood glucose level after you have not eaten for a certain period of time (fasting). If you are not overweight and you do not have risk factors for diabetes, you should be screened once every 3 years starting at age 45. If you are overweight or obese and you are 40-70 years of age, you should be screened for diabetes every year as part of your cardiovascular risk assessment.  Breast cancer screening is essential preventive care for women. You should practice "breast self-awareness." This means understanding the normal appearance and feel of your breasts and may include breast self-examination. Any changes detected, no matter how small, should be reported to a health care provider. Women in their 20s and 30s should have a clinical breast exam (CBE) by a health care provider as part of a regular health exam every 1 to 3 years. After age 40, women should have a CBE every year. Starting at age 40, women should consider having a mammogram (breast X-ray test) every year. Women who have a family history of breast cancer should talk to their health care provider about genetic screening. Women at a high risk of breast cancer should talk to their health care providers about having an MRI and a mammogram every year.  Breast cancer gene (BRCA)-related cancer risk assessment is recommended for women who have family members with BRCA-related cancers. BRCA-related cancers include breast, ovarian, tubal, and peritoneal cancers. Having family members with these cancers may be associated with an increased risk for harmful changes (mutations) in the breast cancer genes BRCA1 and  BRCA2. Results of the assessment will determine the need for genetic counseling and BRCA1 and BRCA2 testing.  Your health care provider may recommend that you be screened regularly for cancer of the pelvic organs (ovaries, uterus, and vagina). This screening involves a pelvic examination, including checking for microscopic changes to the surface of your cervix (Pap test). You may be encouraged to have this screening done every 3 years, beginning at age 21.  For women ages 30-65, health care providers may recommend pelvic exams and Pap testing every 3 years, or they may recommend the Pap and pelvic exam, combined with testing for human papilloma virus (HPV), every 5 years. Some types of HPV increase your risk of cervical cancer. Testing for HPV may also be done on women of any age with unclear Pap test results.  Other health care providers may not recommend any screening for nonpregnant women who are considered low risk for pelvic cancer and who do not have symptoms. Ask your health care provider if a screening pelvic exam is right for   you.  If you have had past treatment for cervical cancer or a condition that could lead to cancer, you need Pap tests and screening for cancer for at least 20 years after your treatment. If Pap tests have been discontinued, your risk factors (such as having a new sexual partner) need to be reassessed to determine if screening should resume. Some women have medical problems that increase the chance of getting cervical cancer. In these cases, your health care provider may recommend more frequent screening and Pap tests.  Colorectal cancer can be detected and often prevented. Most routine colorectal cancer screening begins at the age of 60 years and continues through age 108 years. However, your health care provider may recommend screening at an earlier age if you have risk factors for colon cancer. On a yearly basis, your health care provider may provide home test kits to check  for hidden blood in the stool. Use of a small camera at the end of a tube, to directly examine the colon (sigmoidoscopy or colonoscopy), can detect the earliest forms of colorectal cancer. Talk to your health care provider about this at age 65, when routine screening begins. Direct exam of the colon should be repeated every 5-10 years through age 67 years, unless early forms of precancerous polyps or small growths are found.  People who are at an increased risk for hepatitis B should be screened for this virus. You are considered at high risk for hepatitis B if:  You were born in a country where hepatitis B occurs often. Talk with your health care provider about which countries are considered high risk.  Your parents were born in a high-risk country and you have not received a shot to protect against hepatitis B (hepatitis B vaccine).  You have HIV or AIDS.  You use needles to inject street drugs.  You live with, or have sex with, someone who has hepatitis B.  You get hemodialysis treatment.  You take certain medicines for conditions like cancer, organ transplantation, and autoimmune conditions.  Hepatitis C blood testing is recommended for all people born from [birth year] through 1965 and any individual with known risks for hepatitis C.  Practice safe sex. Use condoms and avoid high-risk sexual practices to reduce the spread of sexually transmitted infections (STIs). STIs include gonorrhea, chlamydia, syphilis, trichomonas, herpes, HPV, and human immunodeficiency virus (HIV). Herpes, HIV, and HPV are viral illnesses that have no cure. They can result in disability, cancer, and death.  You should be screened for sexually transmitted illnesses (STIs) including gonorrhea and chlamydia if:  You are sexually active and are younger than 24 years.  You are older than 24 years and your health care provider tells you that you are at risk for this type of infection.  Your sexual activity has changed  since you were last screened and you are at an increased risk for chlamydia or gonorrhea. Ask your health care provider if you are at risk.  If you are at risk of being infected with HIV, it is recommended that you take a prescription medicine daily to prevent HIV infection. This is called preexposure prophylaxis (PrEP). You are considered at risk if:  You are sexually active and do not regularly use condoms or know the HIV status of your partner(s).  You take drugs by injection.  You are sexually active with a partner who has HIV.  Talk with your health care provider about whether you are at high risk of being infected with HIV. If  you choose to begin PrEP, you should first be tested for HIV. You should then be tested every 3 months for as long as you are taking PrEP.  Osteoporosis is a disease in which the bones lose minerals and strength with aging. This can result in serious bone fractures or breaks. The risk of osteoporosis can be identified using a bone density scan. Women ages 67 years and over and women at risk for fractures or osteoporosis should discuss screening with their health care providers. Ask your health care provider whether you should take a calcium supplement or vitamin D to reduce the rate of osteoporosis.  Menopause can be associated with physical symptoms and risks. Hormone replacement therapy is available to decrease symptoms and risks. You should talk to your health care provider about whether hormone replacement therapy is right for you.  Use sunscreen. Apply sunscreen liberally and repeatedly throughout the day. You should seek shade when your shadow is shorter than you. Protect yourself by wearing long sleeves, pants, a wide-brimmed hat, and sunglasses year round, whenever you are outdoors.  Once a month, do a whole body skin exam, using a mirror to look at the skin on your back. Tell your health care provider of new moles, moles that have irregular borders, moles that  are larger than a pencil eraser, or moles that have changed in shape or color.  Stay current with required vaccines (immunizations).  Influenza vaccine. All adults should be immunized every year.  Tetanus, diphtheria, and acellular pertussis (Td, Tdap) vaccine. Pregnant women should receive 1 dose of Tdap vaccine during each pregnancy. The dose should be obtained regardless of the length of time since the last dose. Immunization is preferred during the 27th-36th week of gestation. An adult who has not previously received Tdap or who does not know her vaccine status should receive 1 dose of Tdap. This initial dose should be followed by tetanus and diphtheria toxoids (Td) booster doses every 10 years. Adults with an unknown or incomplete history of completing a 3-dose immunization series with Td-containing vaccines should begin or complete a primary immunization series including a Tdap dose. Adults should receive a Td booster every 10 years.  Varicella vaccine. An adult without evidence of immunity to varicella should receive 2 doses or a second dose if she has previously received 1 dose. Pregnant females who do not have evidence of immunity should receive the first dose after pregnancy. This first dose should be obtained before leaving the health care facility. The second dose should be obtained 4-8 weeks after the first dose.  Human papillomavirus (HPV) vaccine. Females aged 13-26 years who have not received the vaccine previously should obtain the 3-dose series. The vaccine is not recommended for use in pregnant females. However, pregnancy testing is not needed before receiving a dose. If a female is found to be pregnant after receiving a dose, no treatment is needed. In that case, the remaining doses should be delayed until after the pregnancy. Immunization is recommended for any person with an immunocompromised condition through the age of 61 years if she did not get any or all doses earlier. During the  3-dose series, the second dose should be obtained 4-8 weeks after the first dose. The third dose should be obtained 24 weeks after the first dose and 16 weeks after the second dose.  Zoster vaccine. One dose is recommended for adults aged 30 years or older unless certain conditions are present.  Measles, mumps, and rubella (MMR) vaccine. Adults born  before 1957 generally are considered immune to measles and mumps. Adults born in 1957 or later should have 1 or more doses of MMR vaccine unless there is a contraindication to the vaccine or there is laboratory evidence of immunity to each of the three diseases. A routine second dose of MMR vaccine should be obtained at least 28 days after the first dose for students attending postsecondary schools, health care workers, or international travelers. People who received inactivated measles vaccine or an unknown type of measles vaccine during 1963-1967 should receive 2 doses of MMR vaccine. People who received inactivated mumps vaccine or an unknown type of mumps vaccine before 1979 and are at high risk for mumps infection should consider immunization with 2 doses of MMR vaccine. For females of childbearing age, rubella immunity should be determined. If there is no evidence of immunity, females who are not pregnant should be vaccinated. If there is no evidence of immunity, females who are pregnant should delay immunization until after pregnancy. Unvaccinated health care workers born before 1957 who lack laboratory evidence of measles, mumps, or rubella immunity or laboratory confirmation of disease should consider measles and mumps immunization with 2 doses of MMR vaccine or rubella immunization with 1 dose of MMR vaccine.  Pneumococcal 13-valent conjugate (PCV13) vaccine. When indicated, a person who is uncertain of his immunization history and has no record of immunization should receive the PCV13 vaccine. All adults 65 years of age and older should receive this  vaccine. An adult aged 19 years or older who has certain medical conditions and has not been previously immunized should receive 1 dose of PCV13 vaccine. This PCV13 should be followed with a dose of pneumococcal polysaccharide (PPSV23) vaccine. Adults who are at high risk for pneumococcal disease should obtain the PPSV23 vaccine at least 8 weeks after the dose of PCV13 vaccine. Adults older than 67 years of age who have normal immune system function should obtain the PPSV23 vaccine dose at least 1 year after the dose of PCV13 vaccine.  Pneumococcal polysaccharide (PPSV23) vaccine. When PCV13 is also indicated, PCV13 should be obtained first. All adults aged 65 years and older should be immunized. An adult younger than age 65 years who has certain medical conditions should be immunized. Any person who resides in a nursing home or long-term care facility should be immunized. An adult smoker should be immunized. People with an immunocompromised condition and certain other conditions should receive both PCV13 and PPSV23 vaccines. People with human immunodeficiency virus (HIV) infection should be immunized as soon as possible after diagnosis. Immunization during chemotherapy or radiation therapy should be avoided. Routine use of PPSV23 vaccine is not recommended for American Indians, [place] Natives, or people younger than 65 years unless there are medical conditions that require PPSV23 vaccine. When indicated, people who have unknown immunization and have no record of immunization should receive PPSV23 vaccine. One-time revaccination 5 years after the first dose of PPSV23 is recommended for people aged 19-64 years who have chronic kidney failure, nephrotic syndrome, asplenia, or immunocompromised conditions. People who received 1-2 doses of PPSV23 before age 65 years should receive another dose of PPSV23 vaccine at age 65 years or later if at least 5 years have passed since the previous dose. Doses of PPSV23 are not  needed for people immunized with PPSV23 at or after age 65 years.  Meningococcal vaccine. Adults with asplenia or persistent complement component deficiencies should receive 2 doses of quadrivalent meningococcal conjugate (MenACWY-D) vaccine. The doses should be obtained   at least 2 months apart. Microbiologists working with certain meningococcal bacteria, [place] recruits, people at risk during an outbreak, and people who travel to or live in countries with a high rate of meningitis should be immunized. A first-year college student up through age 59 years who is living in a residence hall should receive a dose if she did not receive a dose on or after her 16th birthday. Adults who have certain high-risk conditions should receive one or more doses of vaccine.  Hepatitis A vaccine. Adults who wish to be protected from this disease, have certain high-risk conditions, work with hepatitis A-infected animals, work in hepatitis A research labs, or travel to or work in countries with a high rate of hepatitis A should be immunized. Adults who were previously unvaccinated and who anticipate close contact with an international adoptee during the first 60 days after arrival in the [place] from a country with a high rate of hepatitis A should be immunized.  Hepatitis B vaccine. Adults who wish to be protected from this disease, have certain high-risk conditions, may be exposed to blood or other infectious body fluids, are household contacts or sex partners of hepatitis B positive people, are clients or workers in certain care facilities, or travel to or work in countries with a high rate of hepatitis B should be immunized.  Haemophilus influenzae type b (Hib) vaccine. A previously unvaccinated person with asplenia or sickle cell disease or having a scheduled splenectomy should receive 1 dose of Hib vaccine. Regardless of previous immunization, a recipient of a hematopoietic stem cell transplant should receive a  3-dose series 6-12 months after her successful transplant. Hib vaccine is not recommended for adults with HIV infection. Preventive Services / Frequency Ages 65 to 3 years  Blood pressure check.** / Every 3-5 years.  Lipid and cholesterol check.** / Every 5 years beginning at age 53.  Clinical breast exam.** / Every 3 years for women in their 21s and 11s.  BRCA-related cancer risk assessment.** / For women who have family members with a BRCA-related cancer (breast, ovarian, tubal, or peritoneal cancers).  Pap test.** / Every 2 years from ages 65 through 56. Every 3 years starting at age 91 through age 79 or 29 with a history of 3 consecutive normal Pap tests.  HPV screening.** / Every 3 years from ages 93 through ages 58 to 26 with a history of 3 consecutive normal Pap tests.  Hepatitis C blood test.** / For any individual with known risks for hepatitis C.  Skin self-exam. / Monthly.  Influenza vaccine. / Every year.  Tetanus, diphtheria, and acellular pertussis (Tdap, Td) vaccine.** / Consult your health care provider. Pregnant women should receive 1 dose of Tdap vaccine during each pregnancy. 1 dose of Td every 10 years.  Varicella vaccine.** / Consult your health care provider. Pregnant females who do not have evidence of immunity should receive the first dose after pregnancy.  HPV vaccine. / 3 doses over 6 months, if 48 and younger. The vaccine is not recommended for use in pregnant females. However, pregnancy testing is not needed before receiving a dose.  Measles, mumps, rubella (MMR) vaccine.** / You need at least 1 dose of MMR if you were born in 1957 or later. You may also need a 2nd dose. For females of childbearing age, rubella immunity should be determined. If there is no evidence of immunity, females who are not pregnant should be vaccinated. If there is no evidence of immunity, females who are  pregnant should delay immunization until after pregnancy.  Pneumococcal  13-valent conjugate (PCV13) vaccine.** / Consult your health care provider.  Pneumococcal polysaccharide (PPSV23) vaccine.** / 1 to 2 doses if you smoke cigarettes or if you have certain conditions.  Meningococcal vaccine.** / 1 dose if you are age 68 to 8 years and a Market researcher living in a residence hall, or have one of several medical conditions, you need to get vaccinated against meningococcal disease. You may also need additional booster doses.  Hepatitis A vaccine.** / Consult your health care provider.  Hepatitis B vaccine.** / Consult your health care provider.  Haemophilus influenzae type b (Hib) vaccine.** / Consult your health care provider. Ages 7 to 53 years  Blood pressure check.** / Every year.  Lipid and cholesterol check.** / Every 5 years beginning at age 25 years.  Lung cancer screening. / Every year if you are aged 11-80 years and have a 30-pack-year history of smoking and currently smoke or have quit within the past 15 years. Yearly screening is stopped once you have quit smoking for at least 15 years or develop a health problem that would prevent you from having lung cancer treatment.  Clinical breast exam.** / Every year after age 48 years.  BRCA-related cancer risk assessment.** / For women who have family members with a BRCA-related cancer (breast, ovarian, tubal, or peritoneal cancers).  Mammogram.** / Every year beginning at age 41 years and continuing for as long as you are in good health. Consult with your health care provider.  Pap test.** / Every 3 years starting at age 65 years through age 37 or 70 years with a history of 3 consecutive normal Pap tests.  HPV screening.** / Every 3 years from ages 72 years through ages 60 to 40 years with a history of 3 consecutive normal Pap tests.  Fecal occult blood test (FOBT) of stool. / Every year beginning at age 21 years and continuing until age 5 years. You may not need to do this test if you get  a colonoscopy every 10 years.  Flexible sigmoidoscopy or colonoscopy.** / Every 5 years for a flexible sigmoidoscopy or every 10 years for a colonoscopy beginning at age 35 years and continuing until age 48 years.  Hepatitis C blood test.** / For all people born from [birth year] through 1967 and any individual with known risks for hepatitis C.  Skin self-exam. / Monthly.  Influenza vaccine. / Every year.  Tetanus, diphtheria, and acellular pertussis (Tdap/Td) vaccine.** / Consult your health care provider. Pregnant women should receive 1 dose of Tdap vaccine during each pregnancy. 1 dose of Td every 10 years.  Varicella vaccine.** / Consult your health care provider. Pregnant females who do not have evidence of immunity should receive the first dose after pregnancy.  Zoster vaccine.** / 1 dose for adults aged 30 years or older.  Measles, mumps, rubella (MMR) vaccine.** / You need at least 1 dose of MMR if you were born in 1957 or later. You may also need a second dose. For females of childbearing age, rubella immunity should be determined. If there is no evidence of immunity, females who are not pregnant should be vaccinated. If there is no evidence of immunity, females who are pregnant should delay immunization until after pregnancy.  Pneumococcal 13-valent conjugate (PCV13) vaccine.** / Consult your health care provider.  Pneumococcal polysaccharide (PPSV23) vaccine.** / 1 to 2 doses if you smoke cigarettes or if you have certain conditions.  Meningococcal vaccine.** /  Consult your health care provider.  Hepatitis A vaccine.** / Consult your health care provider.  Hepatitis B vaccine.** / Consult your health care provider.  Haemophilus influenzae type b (Hib) vaccine.** / Consult your health care provider. Ages 64 years and over  Blood pressure check.** / Every year.  Lipid and cholesterol check.** / Every 5 years beginning at age 23 years.  Lung cancer screening. / Every year if you  are aged 16-80 years and have a 30-pack-year history of smoking and currently smoke or have quit within the past 15 years. Yearly screening is stopped once you have quit smoking for at least 15 years or develop a health problem that would prevent you from having lung cancer treatment.  Clinical breast exam.** / Every year after age 74 years.  BRCA-related cancer risk assessment.** / For women who have family members with a BRCA-related cancer (breast, ovarian, tubal, or peritoneal cancers).  Mammogram.** / Every year beginning at age 44 years and continuing for as long as you are in good health. Consult with your health care provider.  Pap test.** / Every 3 years starting at age 58 years through age 22 or 39 years with 3 consecutive normal Pap tests. Testing can be stopped between 65 and 70 years with 3 consecutive normal Pap tests and no abnormal Pap or HPV tests in the past 10 years.  HPV screening.** / Every 3 years from ages 64 years through ages 70 or 61 years with a history of 3 consecutive normal Pap tests. Testing can be stopped between 65 and 70 years with 3 consecutive normal Pap tests and no abnormal Pap or HPV tests in the past 10 years.  Fecal occult blood test (FOBT) of stool. / Every year beginning at age 40 years and continuing until age 27 years. You may not need to do this test if you get a colonoscopy every 10 years.  Flexible sigmoidoscopy or colonoscopy.** / Every 5 years for a flexible sigmoidoscopy or every 10 years for a colonoscopy beginning at age 7 years and continuing until age 32 years.  Hepatitis C blood test.** / For all people born from [birth year] through 1967 and any individual with known risks for hepatitis C.  Osteoporosis screening.** / A one-time screening for women ages 30 years and over and women at risk for fractures or osteoporosis.  Skin self-exam. / Monthly.  Influenza vaccine. / Every year.  Tetanus, diphtheria, and acellular pertussis (Tdap/Td)  vaccine.** / 1 dose of Td every 10 years.  Varicella vaccine.** / Consult your health care provider.  Zoster vaccine.** / 1 dose for adults aged 35 years or older.  Pneumococcal 13-valent conjugate (PCV13) vaccine.** / Consult your health care provider.  Pneumococcal polysaccharide (PPSV23) vaccine.** / 1 dose for all adults aged 46 years and older.  Meningococcal vaccine.** / Consult your health care provider.  Hepatitis A vaccine.** / Consult your health care provider.  Hepatitis B vaccine.** / Consult your health care provider.  Haemophilus influenzae type b (Hib) vaccine.** / Consult your health care provider. ** Family history and personal history of risk and conditions may change your health care provider's recommendations.   This information is not intended to replace advice given to you by your health care provider. Make sure you discuss any questions you have with your health care provider.   Document Released: 04/28/2001 Document Revised: 03/23/2014 Document Reviewed: 07/28/2010 Elsevier Interactive Patient Education [employer].

## 2015-11-05 NOTE — Progress Notes (Signed)
Subjective:  Patient ID: Diana Reeves, female    DOB: [birth year]  Age: 65 y.o. MRN: [id number]  CC: Annual Exam and Diabetes   HPI SUNITA DEMOND presents for CPX/AWV.  She tells me she saw her gynecologist 6 weeks ago.  She feels well today and offers no new complaints.   She has been working on her lifestyle modifications to control her weight and lower her blood sugars. She has had no recent episodes of chest pain, shortness of breath, palpitations, edema, dyspnea on exertion, near syncope, or fatigue.  Past Medical History:  Diagnosis Date  . Basal cell carcinoma   . Benign neoplasm of colon   . Degenerative cervical disc   . Depression   . Diverticulosis of colon (without mention of hemorrhage)   . Esophageal reflux   . Glaucoma   . Osteoporosis, unspecified    2007 T score -3.0, 2012 T score -1.9, 2015 T score -2.1 overall stable   . Other chronic nonalcoholic liver disease   . Stricture and stenosis of esophagus    Past Surgical History:  Procedure Laterality Date  . ABDOMINAL HYSTERECTOMY  1993   TAH-LSO-Post. repair-[place]  . EYE SURGERY  10/18/2012   lt eye  . left arm surgery  7-09    reports that she has quit smoking. She has never used smokeless tobacco. She reports that she drinks about 1.2 oz of alcohol per week . She reports that she does not use drugs. family history includes Cancer in her father, maternal grandmother, and son; Colon cancer (age of onset: 81) in her father; Diabetes in her paternal grandfather; Heart disease in her mother; Hypertension in her father. Allergies  Allergen Reactions  . Dust Mite Extract   . Mold Extract [Trichophyton Mentagrophyte]     Outpatient Medications Prior to Visit  Medication Sig Dispense Refill  . clonazePAM (KLONOPIN) 0.5 MG tablet Take 1 tablet (0.5 mg total) by mouth 3 (three) times daily as needed for anxiety. 60 tablet 0  . conjugated estrogens (PREMARIN) vaginal cream PLACE 1/3 APPLICATOR EVERY OTHER  NIGHT 90 g 4  . DULoxetine (CYMBALTA) 60 MG capsule TAKE ONE CAPSULE BY MOUTH DAILY. **MUST KEEP 11/05/15 APPOINTMENT FOR FUTURE REFILLS** 90 capsule 3  . NEXIUM 40 MG capsule TAKE 1 CAPSULE (40 MG TOTAL) BY MOUTH DAILY BEFORE BREAKFAST. 30 capsule 10  . timolol (TIMOPTIC) 0.5 % ophthalmic solution as directed.    . zolpidem (AMBIEN) 10 MG tablet Take 1 tablet (10 mg total) by mouth at bedtime as needed for sleep. 30 tablet 0  . Azelastine-Fluticasone (DYMISTA) 137-50 MCG/ACT SUSP     . EPINEPHrine (EPIPEN 2-PAK) 0.3 mg/0.3 mL IJ SOAJ injection     . azithromycin (ZITHROMAX) 250 MG tablet Take 2 tablets by mouth today and then one tablet daily for 4 days. 6 tablet 0  . benzonatate (TESSALON) 100 MG capsule Take 1 capsule (100 mg total) by mouth 3 (three) times daily. 20 capsule 0  . darifenacin (ENABLEX) 7.5 MG 24 hr tablet Take 1 tablet (7.5 mg total) by mouth daily. 30 tablet 12  . nitrofurantoin, macrocrystal-monohydrate, (MACROBID) 100 MG capsule Take 1 capsule (100 mg total) by mouth 2 (two) times daily. 14 capsule 0   No facility-administered medications prior to visit.     ROS Review of Systems  Constitutional: Negative.  Negative for activity change, appetite change, diaphoresis, fatigue and unexpected weight change.  HENT: Negative.   Eyes: Negative.  Negative for visual disturbance.  Respiratory: Negative.  Negative for cough, chest tightness, shortness of breath and stridor.   Cardiovascular: Negative.  Negative for chest pain, palpitations and leg swelling.  Gastrointestinal: Negative.  Negative for abdominal pain, blood in stool, constipation, diarrhea, nausea and vomiting.  Endocrine: Negative.  Negative for polydipsia, polyphagia and polyuria.  Genitourinary: Negative.  Negative for decreased urine volume, difficulty urinating, dysuria, flank pain, frequency, hematuria, urgency and vaginal bleeding.  Musculoskeletal: Negative.  Negative for arthralgias, back pain, myalgias and  neck pain.  Skin: Negative.  Negative for color change and rash.  Allergic/Immunologic: Negative.   Neurological: Negative.   Hematological: Negative.  Negative for adenopathy. Does not bruise/bleed easily.  Psychiatric/Behavioral: Positive for sleep disturbance.    Objective:  BP 138/88 (BP Location: Left Arm, Patient Position: Sitting, Cuff Size: Normal)   Pulse 78   Temp 97.9 F (36.6 C) (Oral)   Resp 16   Ht 5' 6"  (1.676 m)   Wt 200 lb 4 oz (90.8 kg)   LMP 03/17/1991   SpO2 96%   BMI 32.32 kg/m   BP Readings from Last 3 Encounters:  11/05/15 138/88  10/03/15 136/82  08/28/15 124/80    Wt Readings from Last 3 Encounters:  11/05/15 200 lb 4 oz (90.8 kg)  10/03/15 204 lb 4.8 oz (92.7 kg)  08/28/15 209 lb (94.8 kg)    Physical Exam  Constitutional: She is oriented to person, place, and time. No distress.  HENT:  Mouth/Throat: Oropharynx is clear and moist. No oropharyngeal exudate.  Eyes: Conjunctivae are normal. Right eye exhibits no discharge. Left eye exhibits no discharge. No scleral icterus.  Neck: Normal range of motion. Neck supple. No JVD present. No tracheal deviation present. No thyromegaly present.  Cardiovascular: Normal rate, regular rhythm, normal heart sounds and intact distal pulses.  Exam reveals no gallop and no friction rub.   No murmur heard. Pulmonary/Chest: Effort normal and breath sounds normal. No stridor. No respiratory distress. She has no wheezes. She has no rales. She exhibits no tenderness.  Abdominal: Soft. Bowel sounds are normal. She exhibits no distension and no mass. There is no tenderness. There is no rebound and no guarding.  Genitourinary:  Genitourinary Comments: Breast, genitourinary, and rectal exams were deferred at her request since she tells me she just had these done 6 weeks ago by her gynecologist  Musculoskeletal: Normal range of motion. She exhibits no edema, tenderness or deformity.  Lymphadenopathy:    She has no cervical  adenopathy.  Neurological: She is oriented to person, place, and time.  Skin: Skin is warm and dry. No rash noted. She is not diaphoretic. No erythema. No pallor.  Psychiatric: She has a normal mood and affect. Her behavior is normal. Judgment and thought content normal.  Vitals reviewed.   Lab Results  Component Value Date   WBC 7.8 11/05/2015   HGB 13.9 11/05/2015   HCT 41.4 11/05/2015   PLT 316.0 11/05/2015   GLUCOSE 141 (H) 11/05/2015   CHOL 183 11/05/2015   TRIG 158.0 (H) 11/05/2015   HDL 48.60 11/05/2015   LDLCALC 103 (H) 11/05/2015   ALT 19 11/05/2015   AST 21 11/05/2015   NA 141 11/05/2015   K 3.9 11/05/2015   CL 106 11/05/2015   CREATININE 0.71 11/05/2015   BUN 12 11/05/2015   CO2 26 11/05/2015   TSH 1.13 11/05/2015   HGBA1C 6.7 (H) 11/05/2015   MICROALBUR 1.6 11/05/2015    Ct Pelvis Wo Contrast  Result Date: 11/23/2013 CLINICAL DATA:  Right hip and leg pain and ischial pain secondary to a fall 3 weeks ago. EXAM: CT PELVIS WITHOUT CONTRAST TECHNIQUE: Multidetector CT imaging of the pelvis was performed following the standard protocol without intravenous contrast. COMPARISON:  None. FINDINGS: There is a vertical fracture through the right sacral ala. There is also a fracture through the right ischium at the anterior aspect of the right acetabulum and through the right inferior pubic ramus. No significant displacement at any of the fracture sites. No soft tissue hematomas. Incidental note is made of diverticulosis of the distal colon. IMPRESSION: Fracture the right side of the sacrum and through the right ischium at the anterior aspect of the acetabulum and through the right inferior pubic ramus. Minimal displacement. Electronically Signed   By: Rozetta Nunnery M.D.   On: 11/23/2013 08:25    Assessment & Plan:   Yanely was seen today for annual exam and diabetes.  Diagnoses and all orders for this visit:  Need for prophylactic vaccination and inoculation against  influenza -     Flu Vaccine QUAD 36+ mos IM  Essential hypertension, benign- her blood pressure is relatively well controlled but I would like for her to start taking an ARB for cardiovascular health and risk reduction -     Comprehensive metabolic panel; Future -     Urinalysis, Routine w reflex microscopic (not at [facility]); Future  Type 2 diabetes mellitus with complication, without long-term current use of insulin ([place])- her A1c is up to 6.7% which is worsened compared to previously checked, she does not need to start a medication at this time but will continue to work on her lifestyle modifications. -     Comprehensive metabolic panel; Future -     Hemoglobin A1c; Future -     Microalbumin / creatinine urine ratio; Future  Gastroesophageal reflux disease without esophagitis -     CBC with Differential/Platelet; Future  Obesity, Class II, BMI 35-39.9, with comorbidity ([place])- she is working diligently to lose weight with lifestyle modifications.  Hyperlipidemia with target LDL less than 100- she has not quite reached her LDL goal but she is also not willing to start a statin at this time. Will reevaluate this the next time I see her. -     Lipid panel; Future -     TSH; Future  Routine general medical examination at a health care facility   I have discontinued Ms. Butrick's darifenacin, nitrofurantoin (macrocrystal-monohydrate), benzonatate, and azithromycin. I am also having her start on telmisartan. Additionally, I am having her maintain her NEXIUM, clonazePAM, zolpidem, timolol, conjugated estrogens, EPINEPHrine, Azelastine-Fluticasone, and DULoxetine.  Meds ordered this encounter  Medications  . telmisartan (MICARDIS) 40 MG tablet    Sig: Take 1 tablet (40 mg total) by mouth daily.    Dispense:  90 tablet    Refill:  3   See AVS for instructions about healthy living and anticipatory guidance.  Follow-up: Return in about 6 months (around 05/07/2016).  Scarlette Calico, MD

## 2015-11-05 NOTE — Progress Notes (Signed)
Pre visit review using our clinic review tool, if applicable. No additional management support is needed unless otherwise documented below in the visit note. 

## 2015-11-06 MED ORDER — TELMISARTAN 40 MG PO TABS: 40.0000 mg | ORAL_TABLET | Freq: Every day | ORAL | 3 refills | Status: DC

## 2015-11-06 NOTE — Assessment & Plan Note (Signed)

## 2016-03-16 HISTORY — PX: OTHER SURGICAL HISTORY: SHX169

## 2016-06-25 LAB — HM DIABETES EYE EXAM

## 2016-07-01 ENCOUNTER — Telehealth: Payer: Self-pay

## 2016-07-01 NOTE — Telephone Encounter (Signed)
Routing to dr jones---patient's bone density tscore 2.5 from 2015 and diagnosis of osteoporosis---are you ok with patient starting prolia injections--please advise, I will call patient to discuss, thanks

## 2016-07-01 NOTE — Telephone Encounter (Signed)
yes

## 2016-07-02 NOTE — Telephone Encounter (Signed)
Jonelle Sidle will be verifying insurance for prolia benefits and will call patient to discuss prolia

## 2016-10-02 ENCOUNTER — Telehealth: Payer: Self-pay

## 2016-10-02 NOTE — Telephone Encounter (Signed)
Left message asking patient to call tamara back to discuss starting prolia injections for osteoporosis tx---insurance has been verified with estimated $70 copay---can talk with tamara when patient calls back

## 2016-10-08 ENCOUNTER — Telehealth: Payer: Self-pay | Admitting: General Practice

## 2016-10-08 NOTE — Telephone Encounter (Signed)
LMOVM.  Outgoing call to ask patient about receiving Prolia.  Two previous messages have been left so at this time I will archive in the [place] is $70.00 and Insurance benefits are good through the end of the year.

## 2016-10-21 ENCOUNTER — Telehealth: Payer: Self-pay | Admitting: *Deleted

## 2016-10-21 DIAGNOSIS — N952 Postmenopausal atrophic vaginitis: Secondary | ICD-10-CM

## 2016-10-21 MED ORDER — ESTROGENS, CONJUGATED 0.625 MG/GM VA CREA: TOPICAL_CREAM | VAGINAL | 0 refills | Status: DC

## 2016-10-21 NOTE — Telephone Encounter (Signed)
Pt annual scheduled on 11/26/16 needs refill on premarin vaginal cream, Rx sent.

## 2016-10-27 LAB — HM MAMMOGRAPHY: HM Mammogram: NORMAL (ref 0–4)

## 2016-10-29 ENCOUNTER — Encounter: Payer: Self-pay | Admitting: Gynecology

## 2016-10-29 ENCOUNTER — Ambulatory Visit (INDEPENDENT_AMBULATORY_CARE_PROVIDER_SITE_OTHER): Payer: Medicare Other | Admitting: Gynecology

## 2016-10-29 ENCOUNTER — Other Ambulatory Visit: Payer: Self-pay | Admitting: Gynecology

## 2016-10-29 VITALS — BP 118/76

## 2016-10-29 DIAGNOSIS — N898 Other specified noninflammatory disorders of vagina: Secondary | ICD-10-CM | POA: Diagnosis not present

## 2016-10-29 DIAGNOSIS — R350 Frequency of micturition: Secondary | ICD-10-CM

## 2016-10-29 DIAGNOSIS — L298 Other pruritus: Secondary | ICD-10-CM

## 2016-10-29 LAB — URINALYSIS W MICROSCOPIC + REFLEX CULTURE
BILIRUBIN URINE: NEGATIVE
CRYSTALS: NONE SEEN [HPF]
Casts: NONE SEEN [LPF]
GLUCOSE, UA: NEGATIVE
Hgb urine dipstick: NEGATIVE
Ketones, ur: NEGATIVE
Leukocytes, UA: NEGATIVE
Nitrite: NEGATIVE
RBC / HPF: NONE SEEN RBC/HPF (ref <=2)
Specific Gravity, Urine: >1.03 (ref 1.001–1.035)
Yeast: NONE SEEN [HPF]
pH: 5.5 (ref 5.0–8.0)

## 2016-10-29 LAB — WET PREP FOR TRICH, YEAST, CLUE
CLUE CELLS WET PREP: NONE SEEN
TRICH WET PREP: NONE SEEN
WBC, Wet Prep HPF POC: NONE SEEN
Yeast Wet Prep HPF POC: NONE SEEN

## 2016-10-29 MED ORDER — FLUCONAZOLE 150 MG PO TABS: 150.0000 mg | ORAL_TABLET | Freq: Once | ORAL | 0 refills | Status: AC

## 2016-10-29 MED ORDER — SULFAMETHOXAZOLE-TRIMETHOPRIM 800-160 MG PO TABS: 1.0000 | ORAL_TABLET | Freq: Two times a day (BID) | ORAL | 0 refills | Status: DC

## 2016-10-29 NOTE — Addendum Note (Signed)
Addended by: Nelva Nay on: 10/29/2016 11:01 AM   Modules accepted: Orders

## 2016-10-29 NOTE — Patient Instructions (Signed)
Take the Septra antibiotic twice daily for 3 days for the urinary tract.  Take the Diflucan one day for the vaginal symptoms. Follow up if your symptoms persist, worsen or recur.

## 2016-10-29 NOTE — Progress Notes (Signed)
    TEA COLLUMS 10-Aug-1950 [id number]        66 y.o.  G3P3003 presents with 2 weeks of vaginal discharge with irritation and itching. No odor. Just returned from the beach and feels that she probably has a yeast infection. Also some urinary frequency. No dysuria, urgency, low back pain, fever or chills. No nausea, vomiting, diarrhea, constipation.  Past medical history,surgical history, problem list, medications, allergies, family history and social history were all reviewed and documented in the EPIC chart.  Directed ROS with pertinent positives and negatives documented in the history of present illness/assessment and plan.  Exam: [name] assistant Vitals:   10/29/16 0929  BP: 118/76   General appearance:  Normal Spine straight without CVA tenderness Abdomen soft nontender without masses guarding rebound Pelvic external BUS vagina with atrophic changes. White discharge noted. Bimanual exam without masses or tenderness.  Assessment/Plan:  66 y.o. [id number] with history and exam as above. Urinalysis suggests UTI. Will cover with Septra DS 1 by mouth twice a day 3 days. Wet prep is contaminated with cream and she notes that she did use her vaginal estrogen cream 2 days ago. Given her symptoms am going to cover her with Diflucan 150 mg 1 dose. Patient will follow up if symptoms persist, worsen or recur. Patient is due for annual exam and has scheduled next month and will follow up for this.    Anastasio Auerbach MD, 9:42 AM 10/29/2016

## 2016-11-01 LAB — URINE CULTURE

## 2016-11-03 ENCOUNTER — Telehealth: Payer: Self-pay | Admitting: Internal Medicine

## 2016-11-03 NOTE — Telephone Encounter (Signed)
Pt called in and needs a refill on her DULoxetine (CYMBALTA) 60 MG capsule [[id number]] and Ambein   She can not come in until oct?  Can she get enough to make it to her appt?

## 2016-11-09 ENCOUNTER — Other Ambulatory Visit: Payer: Self-pay | Admitting: Internal Medicine

## 2016-11-09 DIAGNOSIS — F5105 Insomnia due to other mental disorder: Secondary | ICD-10-CM

## 2016-11-09 DIAGNOSIS — F418 Other specified anxiety disorders: Secondary | ICD-10-CM

## 2016-11-09 DIAGNOSIS — F409 Phobic anxiety disorder, unspecified: Secondary | ICD-10-CM

## 2016-11-09 MED ORDER — DULOXETINE HCL 60 MG PO CPEP: 60.0000 mg | ORAL_CAPSULE | Freq: Every day | ORAL | 0 refills | Status: DC

## 2016-11-09 MED ORDER — ZOLPIDEM TARTRATE 10 MG PO TABS: 10.0000 mg | ORAL_TABLET | Freq: Every evening | ORAL | 0 refills | Status: DC | PRN

## 2016-11-09 NOTE — Telephone Encounter (Signed)
The original message was never routed, can this be sent in for her today please?

## 2016-11-09 NOTE — Telephone Encounter (Signed)
RX;s written 

## 2016-11-10 NOTE — Telephone Encounter (Signed)
Could not locate rx for [name] so I called script into [facility], also notified pt meds has been approved...Diana Reeves

## 2016-11-26 ENCOUNTER — Encounter: Payer: Self-pay | Admitting: Gynecology

## 2016-11-26 ENCOUNTER — Ambulatory Visit (INDEPENDENT_AMBULATORY_CARE_PROVIDER_SITE_OTHER): Payer: Medicare Other | Admitting: Gynecology

## 2016-11-26 VITALS — BP 142/80 | Ht 66.0 in | Wt 200.0 lb

## 2016-11-26 DIAGNOSIS — Z01411 Encounter for gynecological examination (general) (routine) with abnormal findings: Secondary | ICD-10-CM

## 2016-11-26 DIAGNOSIS — N952 Postmenopausal atrophic vaginitis: Secondary | ICD-10-CM

## 2016-11-26 DIAGNOSIS — M810 Age-related osteoporosis without current pathological fracture: Secondary | ICD-10-CM | POA: Diagnosis not present

## 2016-11-26 NOTE — Patient Instructions (Signed)
Follow up for bone density as scheduled.

## 2016-11-26 NOTE — Progress Notes (Signed)
    Diana Reeves May 20, 1950 [id number]        66 y.o.  G3P3003 for annual gynecologic exam.   Past medical history,surgical history, problem list, medications, allergies, family history and social history were all reviewed and documented as reviewed in the EPIC chart.  ROS:  Performed with pertinent positives and negatives included in the history, assessment and plan.   Additional significant findings :  None   Exam: Diana Reeves assistant Vitals:   11/26/16 1559  BP: (!) 142/80  Weight: 200 lb (90.7 kg)  Height: 5' 6"  (1.676 m)   Body mass index is 32.28 kg/m.  General appearance:  Normal affect, orientation and appearance. Skin: Grossly normal HEENT: Without gross lesions.  No cervical or supraclavicular adenopathy. Thyroid normal.  Lungs:  Clear without wheezing, rales or rhonchi Cardiac: RR, without RMG Abdominal:  Soft, nontender, without masses, guarding, rebound, organomegaly or hernia Breasts:  Examined lying and sitting without masses, retractions, discharge or axillary adenopathy. Pelvic:  Ext, BUS, Vagina: With atrophic changes. Mild rectocele noted  Adnexa: Without masses or tenderness    Anus and perineum: Normal   Rectovaginal: Normal sphincter tone without palpated masses or tenderness.    Assessment/Plan:  66 y.o. G87P3003 female for annual gynecologic exam.   1. Postmenopausal/atrophic genital changes. Status post TAH/LSO, posterior repair in the past. Uses Premarin vaginal cream 3 times weekly. Has done so for years. We have discussed the issues of absorption and systemic effects to include thrombosis such as stroke heart attack DVT and the breast cancer issue. Patient has supply at home but will call when needs more. 2. Osteoporosis. DEXA 2015 T score -2.1. History of Actonel use for 5 years discontinued 2012. Schedule DEXA now and patient will follow up for this. 3. Pap smear 2015. No Pap smear done today. No history of abnormal Pap smears. Current screening  guidelines reviewed and patient is comfortable with stop screening based on age and hysterectomy history. 4. Mammography was reportedly done this summer. Do not have a copy of this but she seemed a negative letter. Breast exam normal today. Continue with annual mammography next year. 5. Colonoscopy 2014. Repeat at their recommended interval. 6. Urinary urgency. Being seen by [facility] and followed for this. 7. Health maintenance. Mild elevated blood pressure at 142/80. Recheck in non exam situation, follow up with primary if continues elevated.  No routine lab work done as patient does this elsewhere. Follow up 1 year, sooner as needed.   Anastasio Auerbach MD, 4:30 PM 11/26/2016

## 2016-12-07 ENCOUNTER — Other Ambulatory Visit: Payer: Self-pay | Admitting: Gynecology

## 2016-12-07 DIAGNOSIS — N952 Postmenopausal atrophic vaginitis: Secondary | ICD-10-CM

## 2016-12-14 DIAGNOSIS — M810 Age-related osteoporosis without current pathological fracture: Secondary | ICD-10-CM

## 2016-12-14 HISTORY — DX: Age-related osteoporosis without current pathological fracture: M81.0

## 2016-12-26 ENCOUNTER — Encounter: Payer: Self-pay | Admitting: Family Medicine

## 2016-12-26 ENCOUNTER — Ambulatory Visit (INDEPENDENT_AMBULATORY_CARE_PROVIDER_SITE_OTHER): Payer: Medicare Other | Admitting: Family Medicine

## 2016-12-26 DIAGNOSIS — J329 Chronic sinusitis, unspecified: Secondary | ICD-10-CM

## 2016-12-26 DIAGNOSIS — I10 Essential (primary) hypertension: Secondary | ICD-10-CM

## 2016-12-26 DIAGNOSIS — J019 Acute sinusitis, unspecified: Secondary | ICD-10-CM | POA: Diagnosis not present

## 2016-12-26 DIAGNOSIS — T7840XD Allergy, unspecified, subsequent encounter: Secondary | ICD-10-CM | POA: Diagnosis not present

## 2016-12-26 DIAGNOSIS — T7840XA Allergy, unspecified, initial encounter: Secondary | ICD-10-CM

## 2016-12-26 HISTORY — DX: Chronic sinusitis, unspecified: J32.9

## 2016-12-26 HISTORY — DX: Allergy, unspecified, initial encounter: T78.40XA

## 2016-12-26 MED ORDER — AMOXICILLIN-POT CLAVULANATE 875-125 MG PO TABS: 1.0000 | ORAL_TABLET | Freq: Two times a day (BID) | ORAL | 0 refills | Status: DC

## 2016-12-26 NOTE — Assessment & Plan Note (Signed)
Has been taking allergy shots with good results but during a recent trip to [place] she had a flare and then became ill she is encouraged to start back on her Dymista and then if symptoms do not improve she will follow up with her allergist.

## 2016-12-26 NOTE — Assessment & Plan Note (Signed)
Has been ill for nearly 2 weeks now and was on the mend until about 3 days ago when she cared for her sick grand baby and she has been worsening since then. Started on Augmentin bid, Mucinex bid, Encouraged increased rest and hydration, add probiotics, zinc such as Coldeze or Xicam. Treat fevers as needed

## 2016-12-26 NOTE — Progress Notes (Signed)
Pre visit review using our clinic review tool, if applicable. No additional management support is needed unless otherwise documented below in the visit note. 

## 2016-12-26 NOTE — Progress Notes (Signed)
Patient ID: Diana Reeves, female   DOB: [birth year], 66 y.o.   MRN: [id number]   Subjective:    Patient ID: Diana Reeves, female    DOB: 05-11-1950, 66 y.o.   MRN: [id number]  Chief Complaint  Patient presents with  . URI    HPI Patient is in today for evaluation of worsening upper respiratory symptoms. She reports being in her usual state of health 2-3 weeks ago then flying to [place] where her allergies flared. She had nasal congestion, headache and ear pressure/pain, used Dymista initially with some relief then she had to fly home last weekend and all of her symptoms worsened and a fever developed. She rested for 2 days and she started to improve then 3-4 days ago she took care of her 26 year old grandchild who was on Augmentin for dbl ear infection and has been worsening since with all of the same symptoms and now rhinorrhea with yellow sputum and coughing up yellow sputum as well. Denies CP/palp/SOB/GI or GU c/o. Taking meds as prescribed  Past Medical History:  Diagnosis Date  . Allergic state 12/26/2016  . Basal cell carcinoma   . Benign neoplasm of colon   . Degenerative cervical disc   . Depression   . Diverticulosis of colon (without mention of hemorrhage)   . Esophageal reflux   . Glaucoma   . Osteoporosis, unspecified    2007 T score -3.0, 2012 T score -1.9, 2015 T score -2.1 overall stable   . Other chronic nonalcoholic liver disease   . Sinusitis 12/26/2016  . Stricture and stenosis of esophagus     Past Surgical History:  Procedure Laterality Date  . ABDOMINAL HYSTERECTOMY  1993   TAH-LSO-Post. repair-[facility]  . EYE SURGERY  10/18/2012   lt eye  . left arm surgery  7-09    Family History  Problem Relation Age of Onset  . Cancer Father        colon  . Hypertension Father   . Colon cancer Father 50  . Heart disease Mother   . Cancer Maternal Grandmother        uterine  . Diabetes Paternal Grandfather   . Cancer Son        Testicular cancer    Social History    Social History  . Marital status: Divorced    Spouse name: N/A  . Number of children: N/A  . Years of education: N/A   Occupational History  . Not on file.   Social History Main Topics  . Smoking status: Former Research scientist (life sciences)  . Smokeless tobacco: Never Used  . Alcohol use 1.2 oz/week    2 Glasses of wine per week     Comment: occasional  . Drug use: No  . Sexual activity: Not Currently     Comment: 1st intercourse 66 yo-More than 5 partner,[place]   Other Topics Concern  . Not on file   Social History Narrative  . No narrative on file    Outpatient Medications Prior to Visit  Medication Sig Dispense Refill  . Azelastine-Fluticasone (DYMISTA) 137-50 MCG/ACT SUSP     . DULoxetine (CYMBALTA) 60 MG capsule Take 1 capsule (60 mg total) by mouth daily. 90 capsule 0  . EPINEPHrine (EPIPEN 2-PAK) 0.3 mg/0.3 mL IJ SOAJ injection     . NEXIUM 40 MG capsule TAKE 1 CAPSULE (40 MG TOTAL) BY MOUTH DAILY BEFORE BREAKFAST. 30 capsule 10  . PREMARIN vaginal cream PLACE 1/3 APPLICATORFUL VAGINALLY EVERY OTHER NIGHT 30  g 6  . timolol (TIMOPTIC) 0.5 % ophthalmic solution as directed.    . zolpidem (AMBIEN) 10 MG tablet Take 1 tablet (10 mg total) by mouth at bedtime as needed for sleep. 90 tablet 0   No facility-administered medications prior to visit.     Allergies  Allergen Reactions  . Dust Mite Extract   . Mold Extract [Trichophyton Mentagrophyte]     Review of Systems  Constitutional: Positive for chills, fever and malaise/fatigue.  HENT: Positive for congestion and sore throat.   Eyes: Negative for blurred vision.  Respiratory: Positive for cough and sputum production. Negative for shortness of breath.   Cardiovascular: Negative for chest pain, palpitations and leg swelling.  Gastrointestinal: Negative for abdominal pain, blood in stool and nausea.  Genitourinary: Negative for dysuria and frequency.  Musculoskeletal: Negative for falls.  Skin: Negative for rash.  Neurological:  Negative for dizziness, loss of consciousness and headaches.  Endo/Heme/Allergies: Negative for environmental allergies.  Psychiatric/Behavioral: Negative for depression. The patient is not nervous/anxious.        Objective:    Physical Exam  Constitutional: She is oriented to person, place, and time. She appears well-developed and well-nourished. No distress.  HENT:  Head: Normocephalic and atraumatic.  Nose: Nose normal.  TMs dull and bulging.  Eyes: Right eye exhibits no discharge. Left eye exhibits no discharge.  Neck: Normal range of motion. Neck supple.  Cardiovascular: Normal rate and regular rhythm.   No murmur heard. Pulmonary/Chest: Effort normal and breath sounds normal.  Abdominal: Soft. Bowel sounds are normal. There is no tenderness.  Musculoskeletal: She exhibits no edema.  Lymphadenopathy:    She has cervical adenopathy.  Neurological: She is alert and oriented to person, place, and time.  Skin: Skin is warm and dry.  Psychiatric: She has a normal mood and affect.  Nursing note and vitals reviewed.   BP 128/78   Pulse 75   Temp 98.2 F (36.8 C) (Oral)   Resp 14   Ht 5' 6"  (1.676 m)   Wt 197 lb (89.4 kg)   LMP 03/17/1991   SpO2 98%   BMI 31.80 kg/m  Wt Readings from Last 3 Encounters:  12/26/16 197 lb (89.4 kg)  11/26/16 200 lb (90.7 kg)  11/05/15 200 lb 4 oz (90.8 kg)     Lab Results  Component Value Date   WBC 7.8 11/05/2015   HGB 13.9 11/05/2015   HCT 41.4 11/05/2015   PLT 316.0 11/05/2015   GLUCOSE 141 (H) 11/05/2015   CHOL 183 11/05/2015   TRIG 158.0 (H) 11/05/2015   HDL 48.60 11/05/2015   LDLCALC 103 (H) 11/05/2015   ALT 19 11/05/2015   AST 21 11/05/2015   NA 141 11/05/2015   K 3.9 11/05/2015   CL 106 11/05/2015   CREATININE 0.71 11/05/2015   BUN 12 11/05/2015   CO2 26 11/05/2015   TSH 1.13 11/05/2015   HGBA1C 6.7 (H) 11/05/2015   MICROALBUR 1.6 11/05/2015    Lab Results  Component Value Date   TSH 1.13 11/05/2015   Lab  Results  Component Value Date   WBC 7.8 11/05/2015   HGB 13.9 11/05/2015   HCT 41.4 11/05/2015   MCV 82.6 11/05/2015   PLT 316.0 11/05/2015   Lab Results  Component Value Date   NA 141 11/05/2015   K 3.9 11/05/2015   CO2 26 11/05/2015   GLUCOSE 141 (H) 11/05/2015   BUN 12 11/05/2015   CREATININE 0.71 11/05/2015   BILITOT 0.5 11/05/2015  ALKPHOS 67 11/05/2015   AST 21 11/05/2015   ALT 19 11/05/2015   PROT 7.3 11/05/2015   ALBUMIN 4.4 11/05/2015   CALCIUM 9.0 11/05/2015   GFR 87.16 11/05/2015   Lab Results  Component Value Date   CHOL 183 11/05/2015   Lab Results  Component Value Date   HDL 48.60 11/05/2015   Lab Results  Component Value Date   LDLCALC 103 (H) 11/05/2015   Lab Results  Component Value Date   TRIG 158.0 (H) 11/05/2015   Lab Results  Component Value Date   CHOLHDL 4 11/05/2015   Lab Results  Component Value Date   HGBA1C 6.7 (H) 11/05/2015       Assessment & Plan:   Problem List Items Addressed This Visit    Essential hypertension, benign    Well controlled, no changes to meds. Encouraged heart healthy diet such as the DASH diet and exercise as tolerated.       Sinusitis    Has been ill for nearly 2 weeks now and was on the mend until about 3 days ago when she cared for her sick grand baby and she has been worsening since then. Started on Augmentin bid, Mucinex bid, Encouraged increased rest and hydration, add probiotics, zinc such as Coldeze or Xicam. Treat fevers as needed      Relevant Medications   amoxicillin-clavulanate (AUGMENTIN) 875-125 MG tablet   Allergic state    Has been taking allergy shots with good results but during a recent trip to [place] she had a flare and then became ill she is encouraged to start back on her Dymista and then if symptoms do not improve she will follow up with her allergist.         I am having Ms. Hamrick start on amoxicillin-clavulanate. I am also having her maintain her NEXIUM, timolol,  EPINEPHrine, Azelastine-Fluticasone, DULoxetine, zolpidem, and PREMARIN.  Meds ordered this encounter  Medications  . amoxicillin-clavulanate (AUGMENTIN) 875-125 MG tablet    Sig: Take 1 tablet by mouth 2 (two) times daily.    Dispense:  20 tablet    Refill:  0    CMA served as scribe during this visit. History, Physical and Plan performed by medical provider. Documentation and orders reviewed and attested to.  Penni Homans, MD

## 2016-12-26 NOTE — Patient Instructions (Addendum)
Encouraged increased rest and hydration, add probiotics, zinc such as Coldeze or Xicam. Treat fevers as needed. Vitamin C 500 to 1000 mg and Elderberry liquid, Sambucol Plain Mucinex twice daily  64 oz of clear fluids   Sinusitis, Adult Sinusitis is soreness and inflammation of your sinuses. Sinuses are hollow spaces in the bones around your face. Your sinuses are located:  Around your eyes.  In the middle of your forehead.  Behind your nose.  In your cheekbones.  Your sinuses and nasal passages are lined with a stringy fluid (mucus). Mucus normally drains out of your sinuses. When your nasal tissues become inflamed or swollen, the mucus can become trapped or blocked so air cannot flow through your sinuses. This allows bacteria, viruses, and funguses to grow, which leads to infection. Sinusitis can develop quickly and last for 7?10 days (acute) or for more than 12 weeks (chronic). Sinusitis often develops after a cold. What are the causes? This condition is caused by anything that creates swelling in the sinuses or stops mucus from draining, including:  Allergies.  Asthma.  Bacterial or viral infection.  Abnormally shaped bones between the nasal passages.  Nasal growths that contain mucus (nasal polyps).  Narrow sinus openings.  Pollutants, such as chemicals or irritants in the air.  A foreign object stuck in the nose.  A fungal infection. This is rare.  What increases the risk? The following factors may make you more likely to develop this condition:  Having allergies or asthma.  Having had a recent cold or respiratory tract infection.  Having structural deformities or blockages in your nose or sinuses.  Having a weak immune system.  Doing a lot of swimming or diving.  Overusing nasal sprays.  Smoking.  What are the signs or symptoms? The main symptoms of this condition are pain and a feeling of pressure around the affected sinuses. Other symptoms  include:  Upper toothache.  Earache.  Headache.  Bad breath.  Decreased sense of smell and taste.  A cough that may get worse at night.  Fatigue.  Fever.  Thick drainage from your nose. The drainage is often green and it may contain pus (purulent).  Stuffy nose or congestion.  Postnasal drip. This is when extra mucus collects in the throat or back of the nose.  Swelling and warmth over the affected sinuses.  Sore throat.  Sensitivity to light.  How is this diagnosed? This condition is diagnosed based on symptoms, a medical history, and a physical exam. To find out if your condition is acute or chronic, your health care provider may:  Look in your nose for signs of nasal polyps.  Tap over the affected sinus to check for signs of infection.  View the inside of your sinuses using an imaging device that has a light attached (endoscope).  If your health care provider suspects that you have chronic sinusitis, you may also:  Be tested for allergies.  Have a sample of mucus taken from your nose (nasal culture) and checked for bacteria.  Have a mucus sample examined to see if your sinusitis is related to an allergy.  If your sinusitis does not respond to treatment and it lasts longer than 8 weeks, you may have an MRI or CT scan to check your sinuses. These scans also help to determine how severe your infection is. In rare cases, a bone biopsy may be done to rule out more serious types of fungal sinus disease. How is this treated? Treatment for sinusitis  depends on the cause and whether your condition is chronic or acute. If a virus is causing your sinusitis, your symptoms will go away on their own within 10 days. You may be given medicines to relieve your symptoms, including:  Topical nasal decongestants. They shrink swollen nasal passages and let mucus drain from your sinuses.  Antihistamines. These drugs block inflammation that is triggered by allergies. This can help  to ease swelling in your nose and sinuses.  Topical nasal corticosteroids. These are nasal sprays that ease inflammation and swelling in your nose and sinuses.  Nasal saline washes. These rinses can help to get rid of thick mucus in your nose.  If your condition is caused by bacteria, you will be given an antibiotic medicine. If your condition is caused by a fungus, you will be given an antifungal medicine. Surgery may be needed to correct underlying conditions, such as narrow nasal passages. Surgery may also be needed to remove polyps. Follow these instructions at home: Medicines  Take, use, or apply over-the-counter and prescription medicines only as told by your health care provider. These may include nasal sprays.  If you were prescribed an antibiotic medicine, take it as told by your health care provider. Do not stop taking the antibiotic even if you start to feel better. Hydrate and Humidify  Drink enough water to keep your urine clear or pale yellow. Staying hydrated will help to thin your mucus.  Use a cool mist humidifier to keep the humidity level in your home above 50%.  Inhale steam for 10-15 minutes, 3-4 times a day or as told by your health care provider. You can do this in the bathroom while a hot shower is running.  Limit your exposure to cool or dry air. Rest  Rest as much as possible.  Sleep with your head raised (elevated).  Make sure to get enough sleep each night. General instructions  Apply a warm, moist washcloth to your face 3-4 times a day or as told by your health care provider. This will help with discomfort.  Wash your hands often with soap and water to reduce your exposure to viruses and other germs. If soap and water are not available, use hand sanitizer.  Do not smoke. Avoid being around people who are smoking (secondhand smoke).  Keep all follow-up visits as told by your health care provider. This is important. Contact a health care provider  if:  You have a fever.  Your symptoms get worse.  Your symptoms do not improve within 10 days. Get help right away if:  You have a severe headache.  You have persistent vomiting.  You have pain or swelling around your face or eyes.  You have vision problems.  You develop confusion.  Your neck is stiff.  You have trouble breathing. This information is not intended to replace advice given to you by your health care provider. Make sure you discuss any questions you have with your health care provider. Document Released: 03/02/2005 Document Revised: 10/27/2015 Document Reviewed: 12/26/2014 Elsevier Interactive Patient Education  2017 [employer].

## 2016-12-26 NOTE — Assessment & Plan Note (Signed)
Well controlled, no changes to meds. Encouraged heart healthy diet such as the DASH diet and exercise as tolerated.  °

## 2016-12-28 ENCOUNTER — Ambulatory Visit (INDEPENDENT_AMBULATORY_CARE_PROVIDER_SITE_OTHER): Payer: Medicare Other

## 2016-12-28 DIAGNOSIS — M810 Age-related osteoporosis without current pathological fracture: Secondary | ICD-10-CM | POA: Diagnosis not present

## 2017-01-04 ENCOUNTER — Encounter: Payer: Self-pay | Admitting: Gynecology

## 2017-01-04 ENCOUNTER — Telehealth: Payer: Self-pay | Admitting: Gynecology

## 2017-01-04 NOTE — Telephone Encounter (Signed)
Tell patient her most recent bone density shows a slight loss of the right hip but overall looks stable. I would recommend she has a vitamin D level checked at our office or her primary physician's office at the next blood draw to make sure she remains in the normal range but otherwise would recommend repeating the bone density in 2 years.

## 2017-01-04 NOTE — Telephone Encounter (Signed)
Left message for pt to call.

## 2017-01-06 ENCOUNTER — Ambulatory Visit (INDEPENDENT_AMBULATORY_CARE_PROVIDER_SITE_OTHER): Payer: Medicare Other | Admitting: Internal Medicine

## 2017-01-06 ENCOUNTER — Encounter: Payer: Self-pay | Admitting: Internal Medicine

## 2017-01-06 ENCOUNTER — Other Ambulatory Visit (INDEPENDENT_AMBULATORY_CARE_PROVIDER_SITE_OTHER): Payer: Medicare Other

## 2017-01-06 VITALS — BP 160/100 | HR 75 | Temp 97.8°F | Resp 16 | Ht 66.0 in | Wt 195.0 lb

## 2017-01-06 DIAGNOSIS — D2230 Melanocytic nevi of unspecified part of face: Secondary | ICD-10-CM | POA: Diagnosis not present

## 2017-01-06 DIAGNOSIS — M839 Adult osteomalacia, unspecified: Secondary | ICD-10-CM

## 2017-01-06 DIAGNOSIS — Z23 Encounter for immunization: Secondary | ICD-10-CM | POA: Diagnosis not present

## 2017-01-06 DIAGNOSIS — M818 Other osteoporosis without current pathological fracture: Secondary | ICD-10-CM | POA: Diagnosis not present

## 2017-01-06 DIAGNOSIS — Z0001 Encounter for general adult medical examination with abnormal findings: Secondary | ICD-10-CM

## 2017-01-06 DIAGNOSIS — E6609 Other obesity due to excess calories: Secondary | ICD-10-CM

## 2017-01-06 DIAGNOSIS — I10 Essential (primary) hypertension: Secondary | ICD-10-CM

## 2017-01-06 DIAGNOSIS — E785 Hyperlipidemia, unspecified: Secondary | ICD-10-CM

## 2017-01-06 DIAGNOSIS — Z0000 Encounter for general adult medical examination without abnormal findings: Secondary | ICD-10-CM

## 2017-01-06 DIAGNOSIS — E118 Type 2 diabetes mellitus with unspecified complications: Secondary | ICD-10-CM | POA: Diagnosis not present

## 2017-01-06 DIAGNOSIS — Z6832 Body mass index (BMI) 32.0-32.9, adult: Secondary | ICD-10-CM

## 2017-01-06 LAB — COMPREHENSIVE METABOLIC PANEL
ALBUMIN: 4.2 g/dL (ref 3.5–5.2)
ALK PHOS: 65 U/L (ref 39–117)
ALT: 21 U/L (ref 0–35)
AST: 24 U/L (ref 0–37)
BUN: 11 mg/dL (ref 6–23)
CALCIUM: 9.2 mg/dL (ref 8.4–10.5)
CHLORIDE: 106 meq/L (ref 96–112)
CO2: 29 mEq/L (ref 19–32)
Creatinine, Ser: 0.64 mg/dL (ref 0.40–1.20)
GFR: 97.91 mL/min (ref >60.00)
Glucose, Bld: 121 mg/dL — ABNORMAL HIGH (ref 70–99)
POTASSIUM: 3.8 meq/L (ref 3.5–5.1)
SODIUM: 143 meq/L (ref 135–145)
TOTAL PROTEIN: 6.9 g/dL (ref 6.0–8.3)
Total Bilirubin: 0.4 mg/dL (ref 0.2–1.2)

## 2017-01-06 LAB — CBC WITH DIFFERENTIAL/PLATELET
BASOS PCT: 0.5 % (ref 0.0–3.0)
Basophils Absolute: 0 10*3/uL (ref 0.0–0.1)
EOS PCT: 4.9 % (ref 0.0–5.0)
Eosinophils Absolute: 0.3 10*3/uL (ref 0.0–0.7)
HEMATOCRIT: 39.1 % (ref 36.0–46.0)
HEMOGLOBIN: 13.1 g/dL (ref 12.0–15.0)
LYMPHS PCT: 44.6 % (ref 12.0–46.0)
Lymphs Abs: 2.5 10*3/uL (ref 0.7–4.0)
MCHC: 33.5 g/dL (ref 30.0–36.0)
MCV: 81.9 fl (ref 78.0–100.0)
MONOS PCT: 6 % (ref 3.0–12.0)
Monocytes Absolute: 0.3 10*3/uL (ref 0.1–1.0)
Neutro Abs: 2.4 10*3/uL (ref 1.4–7.7)
Neutrophils Relative %: 44 % (ref 43.0–77.0)
Platelets: 223 10*3/uL (ref 150.0–400.0)
RBC: 4.77 Mil/uL (ref 3.87–5.11)
RDW: 14.6 % (ref 11.5–15.5)
WBC: 5.5 10*3/uL (ref 4.0–10.5)

## 2017-01-06 LAB — VITAMIN D 25 HYDROXY (VIT D DEFICIENCY, FRACTURES): VITD: 19.53 ng/mL — ABNORMAL LOW (ref 30.00–100.00)

## 2017-01-06 LAB — URINALYSIS, ROUTINE W REFLEX MICROSCOPIC
Bilirubin Urine: NEGATIVE
Hgb urine dipstick: NEGATIVE
Ketones, ur: NEGATIVE
LEUKOCYTES UA: NEGATIVE
NITRITE: NEGATIVE
RBC / HPF: NONE SEEN (ref 0–?)
SPECIFIC GRAVITY, URINE: 1.025 (ref 1.000–1.030)
Total Protein, Urine: NEGATIVE
UROBILINOGEN UA: 0.2 (ref 0.0–1.0)
Urine Glucose: NEGATIVE
pH: 5.5 (ref 5.0–8.0)

## 2017-01-06 LAB — TSH: TSH: 0.88 u[IU]/mL (ref 0.35–4.50)

## 2017-01-06 LAB — HEMOGLOBIN A1C: HEMOGLOBIN A1C: 6.4 % (ref 4.6–6.5)

## 2017-01-06 LAB — MICROALBUMIN / CREATININE URINE RATIO
CREATININE, U: 267.8 mg/dL
MICROALB UR: 2.1 mg/dL — AB (ref 0.0–1.9)
Microalb Creat Ratio: 0.8 mg/g (ref 0.0–30.0)

## 2017-01-06 LAB — LIPID PANEL
CHOLESTEROL: 167 mg/dL (ref 0–200)
HDL: 43.3 mg/dL (ref >39.00)
LDL CALC: 96 mg/dL (ref 0–99)
NonHDL: 123.63
TRIGLYCERIDES: 136 mg/dL (ref 0.0–149.0)
Total CHOL/HDL Ratio: 4
VLDL: 27.2 mg/dL (ref 0.0–40.0)

## 2017-01-06 MED ORDER — IRBESARTAN 300 MG PO TABS: 300.0000 mg | ORAL_TABLET | Freq: Every day | ORAL | 1 refills | Status: DC

## 2017-01-06 MED ORDER — CHOLECALCIFEROL 50 MCG (2000 UT) PO TABS: 1.0000 | ORAL_TABLET | Freq: Every day | ORAL | 3 refills | Status: DC

## 2017-01-06 NOTE — Patient Instructions (Signed)

## 2017-01-06 NOTE — Progress Notes (Signed)
Subjective:  Patient ID: ZYLPHA POYNOR, female    DOB: [birth year]  Age: 66 y.o. MRN: [id number]  CC: Annual Exam; Hypertension; Hyperlipidemia; and Diabetes   HPI Diana Reeves presents for a CPX.   She feels well today and offers no complaints.  She does not monitor her blood pressure or her blood sugar at home.  Past Medical History:  Diagnosis Date  . Allergic state 12/26/2016  . Basal cell carcinoma   . Benign neoplasm of colon   . Degenerative cervical disc   . Depression   . Diverticulosis of colon (without mention of hemorrhage)   . Esophageal reflux   . Glaucoma   . Osteoporosis, unspecified 12/2016   2007 T score -3.0, 2012 T score -1.9, 2015 T score -2.1 overall stable, 2018 T score -2.0  . Other chronic nonalcoholic liver disease   . Sinusitis 12/26/2016  . Stricture and stenosis of esophagus    Past Surgical History:  Procedure Laterality Date  . ABDOMINAL HYSTERECTOMY  1993   TAH-LSO-Post. repair-[facility]  . EYE SURGERY  10/18/2012   lt eye  . left arm surgery  7-09    reports that she has quit smoking. She has never used smokeless tobacco. She reports that she drinks about 1.2 oz of alcohol per week . She reports that she does not use drugs. family history includes Cancer in her father, maternal grandmother, and son; Colon cancer (age of onset: 82) in her father; Diabetes in her paternal grandfather; Heart disease in her mother; Hypertension in her father. Allergies  Allergen Reactions  . Dust Mite Extract   . Mold Extract [Trichophyton Mentagrophyte]     Outpatient Medications Prior to Visit  Medication Sig Dispense Refill  . Azelastine-Fluticasone (DYMISTA) 137-50 MCG/ACT SUSP     . DULoxetine (CYMBALTA) 60 MG capsule Take 1 capsule (60 mg total) by mouth daily. 90 capsule 0  . EPINEPHrine (EPIPEN 2-PAK) 0.3 mg/0.3 mL IJ SOAJ injection     . NEXIUM 40 MG capsule TAKE 1 CAPSULE (40 MG TOTAL) BY MOUTH DAILY BEFORE BREAKFAST. 30 capsule 10  . PREMARIN  vaginal cream PLACE 1/3 APPLICATORFUL VAGINALLY EVERY OTHER NIGHT 30 g 6  . timolol (TIMOPTIC) 0.5 % ophthalmic solution as directed.    . zolpidem (AMBIEN) 10 MG tablet Take 1 tablet (10 mg total) by mouth at bedtime as needed for sleep. 90 tablet 0  . amoxicillin-clavulanate (AUGMENTIN) 875-125 MG tablet Take 1 tablet by mouth 2 (two) times daily. 20 tablet 0   No facility-administered medications prior to visit.     ROS Review of Systems  Constitutional: Negative.  Negative for appetite change, chills, diaphoresis, fatigue, fever and unexpected weight change.  HENT: Negative.  Negative for sinus pressure and trouble swallowing.   Eyes: Negative.  Negative for visual disturbance.  Respiratory: Negative.  Negative for cough, chest tightness, shortness of breath and wheezing.   Cardiovascular: Negative.  Negative for chest pain, palpitations and leg swelling.  Gastrointestinal: Negative for abdominal pain, constipation, diarrhea, nausea and vomiting.  Endocrine: Negative for polydipsia, polyphagia and polyuria.  Genitourinary: Negative.   Musculoskeletal: Negative.  Negative for back pain and neck pain.  Skin: Negative.  Negative for color change and rash.  Allergic/Immunologic: Negative.   Neurological: Negative.  Negative for dizziness, weakness and headaches.  Hematological: Negative for adenopathy. Does not bruise/bleed easily.  Psychiatric/Behavioral: Negative.     Objective:  BP (!) 160/100 (BP Location: Left Arm, Patient Position: Sitting, Cuff Size: Large)  Pulse 75   Temp 97.8 F (36.6 C) (Oral)   Resp 16   Ht 5' 6"  (1.676 m)   Wt 195 lb (88.5 kg)   LMP 03/17/1991   SpO2 95%   BMI 31.47 kg/m   BP Readings from Last 3 Encounters:  01/06/17 (!) 160/100  12/26/16 128/78  11/26/16 (!) 142/80    Wt Readings from Last 3 Encounters:  01/06/17 195 lb (88.5 kg)  12/26/16 197 lb (89.4 kg)  11/26/16 200 lb (90.7 kg)    Physical Exam  Constitutional: She is oriented  to person, place, and time. No distress.  HENT:  Mouth/Throat: Oropharynx is clear and moist. No oropharyngeal exudate.  Eyes: Conjunctivae are normal. Right eye exhibits no discharge. Left eye exhibits no discharge. No scleral icterus.  Neck: Normal range of motion. Neck supple. No JVD present. No thyromegaly present.  Cardiovascular: Normal rate, regular rhythm and intact distal pulses.  Exam reveals no gallop and no friction rub.   No murmur heard. Pulmonary/Chest: Effort normal and breath sounds normal. No respiratory distress. She has no wheezes. She has no rales. She exhibits no tenderness.  Abdominal: Soft. Bowel sounds are normal. She exhibits no distension and no mass. There is no tenderness. There is no rebound and no guarding.  Genitourinary:  Genitourinary Comments: Breast, GU, rectal exams were all deferred at her request.  Musculoskeletal: Normal range of motion. She exhibits no edema, tenderness or deformity.  Lymphadenopathy:    She has no cervical adenopathy.  Neurological: She is alert and oriented to person, place, and time.  Skin: Skin is warm and dry. No rash noted. She is not diaphoretic. No erythema. No pallor.  There is a small, slightly atypical mole in the front of her left ear.  She does not know if it is new or old.  Psychiatric: She has a normal mood and affect. Her behavior is normal. Judgment and thought content normal.  Vitals reviewed.   Lab Results  Component Value Date   WBC 5.5 01/06/2017   HGB 13.1 01/06/2017   HCT 39.1 01/06/2017   PLT 223.0 01/06/2017   GLUCOSE 121 (H) 01/06/2017   CHOL 167 01/06/2017   TRIG 136.0 01/06/2017   HDL 43.30 01/06/2017   LDLCALC 96 01/06/2017   ALT 21 01/06/2017   AST 24 01/06/2017   NA 143 01/06/2017   K 3.8 01/06/2017   CL 106 01/06/2017   CREATININE 0.64 01/06/2017   BUN 11 01/06/2017   CO2 29 01/06/2017   TSH 0.88 01/06/2017   HGBA1C 6.4 01/06/2017   MICROALBUR 2.1 (H) 01/06/2017    Ct Pelvis Wo  Contrast  Result Date: 11/23/2013 CLINICAL DATA:  Right hip and leg pain and ischial pain secondary to a fall 3 weeks ago. EXAM: CT PELVIS WITHOUT CONTRAST TECHNIQUE: Multidetector CT imaging of the pelvis was performed following the standard protocol without intravenous contrast. COMPARISON:  None. FINDINGS: There is a vertical fracture through the right sacral ala. There is also a fracture through the right ischium at the anterior aspect of the right acetabulum and through the right inferior pubic ramus. No significant displacement at any of the fracture sites. No soft tissue hematomas. Incidental note is made of diverticulosis of the distal colon. IMPRESSION: Fracture the right side of the sacrum and through the right ischium at the anterior aspect of the acetabulum and through the right inferior pubic ramus. Minimal displacement. Electronically Signed   By: Rozetta Nunnery M.D.   On: 11/23/2013 08:25  Assessment & Plan:   Jesenia was seen today for annual exam, hypertension, hyperlipidemia and diabetes.  Diagnoses and all orders for this visit:  Essential hypertension, benign- her blood pressure is not adequately well controlled.  Her lab work is negative for any secondary causes or endorgan damage.  We will start an ARB. -     Comprehensive metabolic panel; Future -     CBC with Differential/Platelet; Future -     Urinalysis, Routine w reflex microscopic; Future -     irbesartan (AVAPRO) 300 MG tablet; Take 1 tablet (300 mg total) by mouth daily.  Type 2 diabetes mellitus with complication, without long-term current use of insulin ([place])- her blood sugars are adequately well controlled.  Will start an ARB for renal protection. -     Comprehensive metabolic panel; Future -     Hemoglobin A1c; Future -     Microalbumin / creatinine urine ratio; Future -     irbesartan (AVAPRO) 300 MG tablet; Take 1 tablet (300 mg total) by mouth daily.  Other osteoporosis, unspecified pathological fracture  presence- will start vitamin D replacement therapy. -     VITAMIN D 25 Hydroxy (Vit-D Deficiency, Fractures); Future -     Cholecalciferol 2000 units TABS; Take 1 tablet (2,000 Units total) by mouth daily.  Hyperlipidemia with target LDL less than 100- she has a significantly elevated ASCVD risk score so I have asked her to start taking a statin for CV risk reduction. -     Lipid panel; Future -     TSH; Future  Vitamin D deficient osteomalacia-vitamin D level is quite low.  Will start vitamin D replacement therapy. -     VITAMIN D 25 Hydroxy (Vit-D Deficiency, Fractures); Future -     Cholecalciferol 2000 units TABS; Take 1 tablet (2,000 Units total) by mouth daily.  Need for influenza vaccination -     Flu vaccine HIGH DOSE PF (Fluzone High dose)  Atypical nevus of face -     Ambulatory referral to Dermatology   I have discontinued Ms. Hanna's amoxicillin-clavulanate. I am also having her start on irbesartan, Cholecalciferol, and rosuvastatin. Additionally, I am having her maintain her NEXIUM, timolol, EPINEPHrine, Azelastine-Fluticasone, DULoxetine, zolpidem, and PREMARIN.  Meds ordered this encounter  Medications  . irbesartan (AVAPRO) 300 MG tablet    Sig: Take 1 tablet (300 mg total) by mouth daily.    Dispense:  90 tablet    Refill:  1  . Cholecalciferol 2000 units TABS    Sig: Take 1 tablet (2,000 Units total) by mouth daily.    Dispense:  90 tablet    Refill:  3  . rosuvastatin (CRESTOR) 5 MG tablet    Sig: Take 1 tablet (5 mg total) by mouth at bedtime.    Dispense:  90 tablet    Refill:  1   See AVS for instructions about healthy living and anticipatory guidance.  Follow-up: Return in about 3 months (around 04/08/2017).  Scarlette Calico, MD

## 2017-01-07 ENCOUNTER — Encounter: Payer: Self-pay | Admitting: Internal Medicine

## 2017-01-07 DIAGNOSIS — D2230 Melanocytic nevi of unspecified part of face: Secondary | ICD-10-CM | POA: Insufficient documentation

## 2017-01-08 MED ORDER — ROSUVASTATIN CALCIUM 5 MG PO TABS: 5.0000 mg | ORAL_TABLET | Freq: Every day | ORAL | 1 refills | Status: DC

## 2017-01-08 NOTE — Assessment & Plan Note (Signed)

## 2017-01-21 NOTE — Telephone Encounter (Signed)
Pt informed

## 2017-02-02 ENCOUNTER — Other Ambulatory Visit: Payer: Self-pay | Admitting: Internal Medicine

## 2017-02-02 DIAGNOSIS — F418 Other specified anxiety disorders: Secondary | ICD-10-CM

## 2017-05-25 ENCOUNTER — Encounter: Payer: Self-pay | Admitting: Internal Medicine

## 2017-05-25 ENCOUNTER — Ambulatory Visit: Payer: Medicare Other | Admitting: Internal Medicine

## 2017-05-25 VITALS — BP 154/88 | HR 86 | Temp 98.9°F | Resp 16 | Ht 66.0 in | Wt 202.1 lb

## 2017-05-25 DIAGNOSIS — J0100 Acute maxillary sinusitis, unspecified: Secondary | ICD-10-CM | POA: Diagnosis not present

## 2017-05-25 MED ORDER — AMOXICILLIN-POT CLAVULANATE 875-125 MG PO TABS: 1.0000 | ORAL_TABLET | Freq: Two times a day (BID) | ORAL | 0 refills | Status: AC

## 2017-05-25 NOTE — Patient Instructions (Signed)

## 2017-05-25 NOTE — Progress Notes (Signed)
Subjective:  Patient ID: Diana Reeves, female    DOB: 02/26/1951  Age: 67 y.o. MRN: [id number]  CC: Sinusitis   HPI Diana Reeves presents for a one-week history of runny nose and cough that are both productive of a thick yellow/green/brown phlegm.  She complains of facial pain, postnasal drip, and chills.  She denies fever, headache, hemoptysis, shortness of breath, or wheezing.  Outpatient Medications Prior to Visit  Medication Sig Dispense Refill  . Azelastine-Fluticasone (DYMISTA) 137-50 MCG/ACT SUSP     . Cholecalciferol 2000 units TABS Take 1 tablet (2,000 Units total) by mouth daily. 90 tablet 3  . DULoxetine (CYMBALTA) 60 MG capsule Take 1 capsule (60 mg total) by mouth daily. 90 capsule 1  . EPINEPHrine (EPIPEN 2-PAK) 0.3 mg/0.3 mL IJ SOAJ injection     . irbesartan (AVAPRO) 300 MG tablet Take 1 tablet (300 mg total) by mouth daily. 90 tablet 1  . NEXIUM 40 MG capsule TAKE 1 CAPSULE (40 MG TOTAL) BY MOUTH DAILY BEFORE BREAKFAST. 30 capsule 10  . PREMARIN vaginal cream PLACE 1/3 APPLICATORFUL VAGINALLY EVERY OTHER NIGHT 30 g 6  . rosuvastatin (CRESTOR) 5 MG tablet Take 1 tablet (5 mg total) by mouth at bedtime. 90 tablet 1  . timolol (TIMOPTIC) 0.5 % ophthalmic solution as directed.    . zolpidem (AMBIEN) 10 MG tablet Take 1 tablet (10 mg total) by mouth at bedtime as needed for sleep. 90 tablet 0   No facility-administered medications prior to visit.     ROS Review of Systems  Constitutional: Positive for chills. Negative for diaphoresis, fatigue and fever.  HENT: Positive for postnasal drip, rhinorrhea, sinus pressure and sinus pain. Negative for congestion, facial swelling, sore throat and trouble swallowing.   Respiratory: Positive for cough. Negative for chest tightness, shortness of breath and wheezing.   Cardiovascular: Negative for chest pain, palpitations and leg swelling.  Gastrointestinal: Negative for abdominal pain, constipation, diarrhea, nausea and vomiting.    Endocrine: Negative.   Genitourinary: Negative.  Negative for difficulty urinating and hematuria.  Musculoskeletal: Negative.  Negative for back pain and myalgias.  Skin: Negative.  Negative for color change, pallor and rash.  Allergic/Immunologic: Negative.   Neurological: Negative.  Negative for dizziness, weakness and headaches.  Hematological: Negative for adenopathy. Does not bruise/bleed easily.  Psychiatric/Behavioral: Negative.     Objective:  BP (!) 154/88 (BP Location: Left Arm, Patient Position: Sitting, Cuff Size: Normal)   Pulse 86   Temp 98.9 F (37.2 C) (Oral)   Resp 16   Ht 5' 6"  (1.676 m)   Wt 202 lb 1.3 oz (91.7 kg)   LMP 03/17/1991   SpO2 96%   BMI 32.62 kg/m   BP Readings from Last 3 Encounters:  05/25/17 (!) 154/88  01/06/17 (!) 160/100  12/26/16 128/78    Wt Readings from Last 3 Encounters:  05/25/17 202 lb 1.3 oz (91.7 kg)  01/06/17 195 lb (88.5 kg)  12/26/16 197 lb (89.4 kg)    Physical Exam  Constitutional: She is oriented to person, place, and time.  Non-toxic appearance. She does not have a sickly appearance. She does not appear ill. No distress.  HENT:  Right Ear: Tympanic membrane normal.  Left Ear: Tympanic membrane normal.  Nose: Rhinorrhea present. No mucosal edema. No epistaxis. Right sinus exhibits maxillary sinus tenderness. Right sinus exhibits no frontal sinus tenderness. Left sinus exhibits maxillary sinus tenderness. Left sinus exhibits no frontal sinus tenderness.  Mouth/Throat: Oropharynx is clear and moist  and mucous membranes are normal. Mucous membranes are not pale, not dry and not cyanotic. No trismus in the jaw. No uvula swelling. No oropharyngeal exudate.  Eyes: Conjunctivae are normal. Left eye exhibits no discharge. No scleral icterus.  Neck: Normal range of motion. Neck supple. No JVD present. No thyromegaly present.  Cardiovascular: Normal rate, regular rhythm and normal heart sounds. Exam reveals no gallop.  No murmur  heard. Pulmonary/Chest: Effort normal and breath sounds normal. No respiratory distress. She has no wheezes. She has no rales.  Abdominal: Soft. Bowel sounds are normal. She exhibits no distension and no mass. There is no tenderness. There is no guarding.  Musculoskeletal: Normal range of motion. She exhibits no edema or tenderness.  Lymphadenopathy:    She has no cervical adenopathy.  Neurological: She is alert and oriented to person, place, and time.  Skin: Skin is warm and dry. No rash noted. She is not diaphoretic. No erythema. No pallor.  Vitals reviewed.   Lab Results  Component Value Date   WBC 5.5 01/06/2017   HGB 13.1 01/06/2017   HCT 39.1 01/06/2017   PLT 223.0 01/06/2017   GLUCOSE 121 (H) 01/06/2017   CHOL 167 01/06/2017   TRIG 136.0 01/06/2017   HDL 43.30 01/06/2017   LDLCALC 96 01/06/2017   ALT 21 01/06/2017   AST 24 01/06/2017   NA 143 01/06/2017   K 3.8 01/06/2017   CL 106 01/06/2017   CREATININE 0.64 01/06/2017   BUN 11 01/06/2017   CO2 29 01/06/2017   TSH 0.88 01/06/2017   HGBA1C 6.4 01/06/2017   MICROALBUR 2.1 (H) 01/06/2017    Ct Pelvis Wo Contrast  Result Date: 11/23/2013 CLINICAL DATA:  Right hip and leg pain and ischial pain secondary to a fall 3 weeks ago. EXAM: CT PELVIS WITHOUT CONTRAST TECHNIQUE: Multidetector CT imaging of the pelvis was performed following the standard protocol without intravenous contrast. COMPARISON:  None. FINDINGS: There is a vertical fracture through the right sacral ala. There is also a fracture through the right ischium at the anterior aspect of the right acetabulum and through the right inferior pubic ramus. No significant displacement at any of the fracture sites. No soft tissue hematomas. Incidental note is made of diverticulosis of the distal colon. IMPRESSION: Fracture the right side of the sacrum and through the right ischium at the anterior aspect of the acetabulum and through the right inferior pubic ramus. Minimal  displacement. Electronically Signed   By: Rozetta Nunnery M.D.   On: 11/23/2013 08:25    Assessment & Plan:   Diana Reeves was seen today for sinusitis.  Diagnoses and all orders for this visit:  Acute non-recurrent maxillary sinusitis -     amoxicillin-clavulanate (AUGMENTIN) 875-125 MG tablet; Take 1 tablet by mouth 2 (two) times daily for 10 days.   I am having Krishawna Stiefel. Brandis start on amoxicillin-clavulanate. I am also having her maintain her NEXIUM, timolol, EPINEPHrine, Azelastine-Fluticasone, zolpidem, PREMARIN, irbesartan, Cholecalciferol, rosuvastatin, and DULoxetine.  Meds ordered this encounter  Medications  . amoxicillin-clavulanate (AUGMENTIN) 875-125 MG tablet    Sig: Take 1 tablet by mouth 2 (two) times daily for 10 days.    Dispense:  20 tablet    Refill:  0     Follow-up: Return if symptoms worsen or fail to improve.  Scarlette Calico, MD

## 2017-06-01 ENCOUNTER — Ambulatory Visit: Payer: Medicare Other | Admitting: Internal Medicine

## 2017-06-08 ENCOUNTER — Encounter: Payer: Self-pay | Admitting: Family Medicine

## 2017-06-08 ENCOUNTER — Ambulatory Visit: Payer: Medicare Other | Admitting: Family Medicine

## 2017-06-08 DIAGNOSIS — J069 Acute upper respiratory infection, unspecified: Secondary | ICD-10-CM | POA: Insufficient documentation

## 2017-06-08 DIAGNOSIS — F418 Other specified anxiety disorders: Secondary | ICD-10-CM | POA: Diagnosis not present

## 2017-06-08 DIAGNOSIS — B9789 Other viral agents as the cause of diseases classified elsewhere: Secondary | ICD-10-CM

## 2017-06-08 MED ORDER — AZELASTINE-FLUTICASONE 137-50 MCG/ACT NA SUSP: NASAL | 11 refills | Status: DC

## 2017-06-08 MED ORDER — CLONAZEPAM 0.5 MG PO TABS
0.5000 mg | ORAL_TABLET | Freq: Every evening | ORAL | 0 refills | Status: DC | PRN
Start: 2017-06-08 — End: 2018-01-26

## 2017-06-08 NOTE — Patient Instructions (Signed)
Please try things such as zyrtec-D or allegra-D which is an antihistamine and decongestant.   Please try afrin which will help with nasal congestion but use for only three days.   Please also try using a netti pot on a regular occasion.  Honey can help with a sore throat.   Vick's and delsym can help with cough

## 2017-06-08 NOTE — Assessment & Plan Note (Signed)
Likely viral in nature. Has completed a course of augmentin  - counseled on supportive care  - given indications to follow up.

## 2017-06-08 NOTE — Assessment & Plan Note (Signed)
Her son recently had a MI. She thinks its related to his chemo for his cancer. Having trouble sleeping. Has taken klonopin in the past for sleep  - provided klonopin.

## 2017-06-08 NOTE — Progress Notes (Signed)
SHANIQUA GUILLOT - 67 y.o. female MRN [id number]  Date of birth: 03/02/1951  SUBJECTIVE:  Including CC & ROS.  Chief Complaint  Patient presents with  . Sinus Problem    MACKINLEY CASSADAY is a 67 y.o. female that is presenting with sinus pressure and cough. Has been ongoing four days. She completed a course Augmentin for a sinus infection three days ago. Admits to a productive cough with mucous.Denies body aches or fevers. Denies shortness of breath. She has been taking Mucinex.  Is no sleeping well since her son had an MI. He had been undergoing chemo for his cancer diagnosis. She has taken klonopin previously to help with sleep problems. Does not take the medication a regular basis.    Review of Systems  Constitutional: Positive for activity change. Negative for fever.  HENT: Positive for congestion and sinus pressure.   Respiratory: Positive for cough.   Cardiovascular: Negative for chest pain.  Gastrointestinal: Negative for abdominal pain.    HISTORY: Past Medical, Surgical, Social, and Family History Reviewed & Updated per EMR.   Pertinent Historical Findings include:  Past Medical History:  Diagnosis Date  . Allergic state 12/26/2016  . Basal cell carcinoma   . Benign neoplasm of colon   . Degenerative cervical disc   . Depression   . Diverticulosis of colon (without mention of hemorrhage)   . Esophageal reflux   . Glaucoma   . Osteoporosis, unspecified 12/2016   2007 T score -3.0, 2012 T score -1.9, 2015 T score -2.1 overall stable, 2018 T score -2.0  . Other chronic nonalcoholic liver disease   . Sinusitis 12/26/2016  . Stricture and stenosis of esophagus     Past Surgical History:  Procedure Laterality Date  . ABDOMINAL HYSTERECTOMY  1993   TAH-LSO-Post. repair-[facility]  . EYE SURGERY  10/18/2012   lt eye  . left arm surgery  7-09    Allergies  Allergen Reactions  . Dust Mite Extract   . Mold Extract [Trichophyton Mentagrophyte]     Family History  Problem  Relation Age of Onset  . Cancer Father        colon  . Hypertension Father   . Colon cancer Father 38  . Heart disease Mother   . Cancer Maternal Grandmother        uterine  . Diabetes Paternal Grandfather   . Cancer Son        Testicular cancer     Social History   Socioeconomic History  . Marital status: Divorced    Spouse name: Not on file  . Number of children: Not on file  . Years of education: Not on file  . Highest education level: Not on file  Occupational History  . Not on file  Social Needs  . Financial resource strain: Not on file  . Food insecurity:    Worry: Not on file    Inability: Not on file  . Transportation needs:    Medical: Not on file    Non-medical: Not on file  Tobacco Use  . Smoking status: Former Research scientist (life sciences)  . Smokeless tobacco: Never Used  Substance and Sexual Activity  . Alcohol use: Yes    Alcohol/week: 1.2 oz    Types: 2 Glasses of wine per week    Comment: occasional  . Drug use: No  . Sexual activity: Not Currently    Comment: 1st intercourse 67 yo-More than 5 partner,[place]  Lifestyle  . Physical activity:    Days  per week: Not on file    Minutes per session: Not on file  . Stress: Not on file  Relationships  . Social connections:    Talks on phone: Not on file    Gets together: Not on file    Attends religious service: Not on file    Active member of club or organization: Not on file    Attends meetings of clubs or organizations: Not on file    Relationship status: Not on file  . Intimate partner violence:    Fear of current or ex partner: Not on file    Emotionally abused: Not on file    Physically abused: Not on file    Forced sexual activity: Not on file  Other Topics Concern  . Not on file  Social History Narrative  . Not on file     PHYSICAL EXAM:  VS: BP 124/62 (BP Location: Left Arm, Patient Position: Sitting, Cuff Size: Normal)   Pulse 90   Temp 98.4 F (36.9 C) (Oral)   Ht 5' 6"  (1.676 m)   Wt 192 lb  (87.1 kg)   LMP 03/17/1991   SpO2 96%   BMI 30.99 kg/m  Physical Exam Gen: NAD, alert, cooperative with exam,  ENT: normal lips, normal nasal mucosa, tympanic membranes clear and intact bilaterally, normal oropharynx, no cervical lymphadenopathy Eye: normal EOM, normal conjunctiva and lids CV:  no edema, +2 pedal pulses, regular rate and rhythm, S1-S2   Resp: no accessory muscle use, non-labored, clear to auscultation bilaterally, no crackles or wheezes Skin: no rashes, no areas of induration  Neuro: normal tone, normal sensation to touch Psych:  normal insight, alert and oriented MSK: Normal gait, normal strength       ASSESSMENT & PLAN:   URI (upper respiratory infection) Likely viral in nature. Has completed a course of augmentin  - counseled on supportive care  - given indications to follow up.   Depression with anxiety Her son recently had a MI. She thinks its related to his chemo for his cancer. Having trouble sleeping. Has taken klonopin in the past for sleep  - provided klonopin.

## 2017-06-15 ENCOUNTER — Telehealth: Payer: Self-pay | Admitting: Internal Medicine

## 2017-06-15 NOTE — Telephone Encounter (Signed)
Rec;d from [facility] forwarded 3 pages to Dr.Thomas Ronnald Ramp

## 2017-07-08 ENCOUNTER — Other Ambulatory Visit: Payer: Self-pay | Admitting: Internal Medicine

## 2017-07-08 DIAGNOSIS — E118 Type 2 diabetes mellitus with unspecified complications: Secondary | ICD-10-CM

## 2017-07-08 DIAGNOSIS — I10 Essential (primary) hypertension: Secondary | ICD-10-CM

## 2017-07-31 ENCOUNTER — Other Ambulatory Visit: Payer: Self-pay | Admitting: Internal Medicine

## 2017-07-31 DIAGNOSIS — F418 Other specified anxiety disorders: Secondary | ICD-10-CM

## 2017-08-15 ENCOUNTER — Other Ambulatory Visit: Payer: Self-pay | Admitting: Internal Medicine

## 2017-08-15 DIAGNOSIS — I10 Essential (primary) hypertension: Secondary | ICD-10-CM

## 2017-08-15 DIAGNOSIS — E118 Type 2 diabetes mellitus with unspecified complications: Secondary | ICD-10-CM

## 2017-08-15 MED ORDER — OLMESARTAN MEDOXOMIL 40 MG PO TABS
40.0000 mg | ORAL_TABLET | Freq: Every day | ORAL | 1 refills | Status: DC
Start: 2017-08-15 — End: 2018-02-26

## 2017-09-10 ENCOUNTER — Ambulatory Visit: Payer: Medicare Other | Admitting: Internal Medicine

## 2017-09-10 ENCOUNTER — Encounter: Payer: Self-pay | Admitting: Internal Medicine

## 2017-09-10 VITALS — BP 140/82 | HR 100 | Temp 97.9°F | Resp 18 | Wt 199.0 lb

## 2017-09-10 DIAGNOSIS — J0110 Acute frontal sinusitis, unspecified: Secondary | ICD-10-CM | POA: Diagnosis not present

## 2017-09-10 MED ORDER — SULFAMETHOXAZOLE-TRIMETHOPRIM 800-160 MG PO TABS: 1.0000 | ORAL_TABLET | Freq: Two times a day (BID) | ORAL | 0 refills | Status: DC

## 2017-09-10 NOTE — Progress Notes (Signed)
Subjective:    Patient ID: Diana Reeves, female    DOB: 05/28/1950, 67 y.o.   MRN: [id number]  HPI The patient is here for an acute visit.  In the past few months she has had 2 sinus infections and one double ear infection.  She is following with an allergist.  She does feel that over the past few months she has been more busy and probably not taking good care of herself.  She currently has cold symptoms and she is leaving in 1 week on vacation.  She is extremely fatigued.  She has nasal congestion with yellow mucus, postnasal drip, sore throat, frontal sinus pressure, headaches and a productive cough with green sputum.  She has not had any fevers, chest tightness, shortness of breath or wheezing.  She is taking her Dymista nasal spray, but not daily.    Medications and allergies reviewed with patient and updated if appropriate.  Patient Active Problem List   Diagnosis Date Noted  . Atypical nevus of face 01/07/2017  . Allergic state 12/26/2016  . Hyperlipidemia with target LDL less than 100 08/29/2014  . Insomnia due to anxiety and fear 08/29/2014  . Type II diabetes mellitus with manifestations ([place]) 10/10/2012  . Class 1 obesity with serious comorbidity and body mass index (BMI) of 32.0 to 32.9 in adult 10/10/2012  . Routine general medical examination at a health care facility 06/20/2012  . Trochanteric bursitis of right hip 06/20/2012  . Glaucoma   . Essential hypertension, benign 09/01/2010  . Depression with anxiety 09/01/2010  . Vitamin D deficient osteomalacia 09/01/2010  . GERD 05/04/2007  . Osteoporosis 05/04/2007  . ESOPHAGEAL STRICTURE 05/05/2000    Current Outpatient Medications on File Prior to Visit  Medication Sig Dispense Refill  . Azelastine-Fluticasone (DYMISTA) 137-50 MCG/ACT SUSP     . Azelastine-Fluticasone 137-50 MCG/ACT SUSP One spray each nostril BID 1 Bottle 11  . Cholecalciferol 2000 units TABS Take 1 tablet (2,000 Units total) by mouth daily. 90  tablet 3  . clonazePAM (KLONOPIN) 0.5 MG tablet Take 1 tablet (0.5 mg total) by mouth at bedtime as needed for anxiety. 12 tablet 0  . DULoxetine (CYMBALTA) 60 MG capsule TAKE ONE CAPSULE BY MOUTH DAILY 90 capsule 1  . EPINEPHrine (EPIPEN 2-PAK) 0.3 mg/0.3 mL IJ SOAJ injection     . NEXIUM 40 MG capsule TAKE 1 CAPSULE (40 MG TOTAL) BY MOUTH DAILY BEFORE BREAKFAST. 30 capsule 10  . olmesartan (BENICAR) 40 MG tablet Take 1 tablet (40 mg total) by mouth daily. 90 tablet 1  . PREMARIN vaginal cream PLACE 1/3 APPLICATORFUL VAGINALLY EVERY OTHER NIGHT 30 g 6  . timolol (TIMOPTIC) 0.5 % ophthalmic solution as directed.    . zolpidem (AMBIEN) 10 MG tablet Take 1 tablet (10 mg total) by mouth at bedtime as needed for sleep. 90 tablet 0   No current facility-administered medications on file prior to visit.     Past Medical History:  Diagnosis Date  . Allergic state 12/26/2016  . Basal cell carcinoma   . Benign neoplasm of colon   . Degenerative cervical disc   . Depression   . Diverticulosis of colon (without mention of hemorrhage)   . Esophageal reflux   . Glaucoma   . Osteoporosis, unspecified 12/2016   2007 T score -3.0, 2012 T score -1.9, 2015 T score -2.1 overall stable, 2018 T score -2.0  . Other chronic nonalcoholic liver disease   . Sinusitis 12/26/2016  . Stricture and  stenosis of esophagus     Past Surgical History:  Procedure Laterality Date  . ABDOMINAL HYSTERECTOMY  1993   TAH-LSO-Post. repair-[facility]  . EYE SURGERY  10/18/2012   lt eye  . left arm surgery  7-09    Social History   Socioeconomic History  . Marital status: Divorced    Spouse name: Not on file  . Number of children: Not on file  . Years of education: Not on file  . Highest education level: Not on file  Occupational History  . Not on file  Social Needs  . Financial resource strain: Not on file  . Food insecurity:    Worry: Not on file    Inability: Not on file  . Transportation needs:    Medical:  Not on file    Non-medical: Not on file  Tobacco Use  . Smoking status: Former Research scientist (life sciences)  . Smokeless tobacco: Never Used  Substance and Sexual Activity  . Alcohol use: Yes    Alcohol/week: 1.2 oz    Types: 2 Glasses of wine per week    Comment: occasional  . Drug use: No  . Sexual activity: Not Currently    Comment: 1st intercourse 67 yo-More than 5 partner,[place]  Lifestyle  . Physical activity:    Days per week: Not on file    Minutes per session: Not on file  . Stress: Not on file  Relationships  . Social connections:    Talks on phone: Not on file    Gets together: Not on file    Attends religious service: Not on file    Active member of club or organization: Not on file    Attends meetings of clubs or organizations: Not on file    Relationship status: Not on file  Other Topics Concern  . Not on file  Social History Narrative  . Not on file    Family History  Problem Relation Age of Onset  . Cancer Father        colon  . Hypertension Father   . Colon cancer Father 12  . Heart disease Mother   . Cancer Maternal Grandmother        uterine  . Diabetes Paternal Grandfather   . Cancer Son        Testicular cancer    Review of Systems  Constitutional: Negative for chills and fever.  HENT: Positive for congestion (yellow mucus), postnasal drip, sinus pain and sore throat.        Ears bubbling  Respiratory: Positive for cough (green sputum). Negative for chest tightness, shortness of breath and wheezing.   Neurological: Positive for headaches.       Objective:   Vitals:   09/10/17 1134  BP: 140/82  Pulse: 100  Resp: 18  Temp: 97.9 F (36.6 C)  SpO2: 99%   BP Readings from Last 3 Encounters:  09/10/17 140/82  06/08/17 124/62  05/25/17 (!) 154/88   Wt Readings from Last 3 Encounters:  09/10/17 199 lb (90.3 kg)  06/08/17 192 lb (87.1 kg)  05/25/17 202 lb 1.3 oz (91.7 kg)   Body mass index is 32.12 kg/m.   Physical Exam    GENERAL APPEARANCE:  Appears stated age, well appearing, NAD EYES: conjunctiva clear, no icterus HEENT: bilateral tympanic membranes and ear canals normal, oropharynx with no erythema, no thyromegaly, trachea midline, no cervical or supraclavicular lymphadenopathy LUNGS: Clear to auscultation without wheeze or crackles, unlabored breathing, good air entry bilaterally CARDIOVASCULAR: Normal S1,S2 without  murmurs, no edema SKIN: Warm, dry      Assessment & Plan:    See Problem List for Assessment and Plan of chronic medical problems.

## 2017-09-10 NOTE — Patient Instructions (Addendum)
Take the antibiotic as prescribed.    Continue the dymista daily.     Call if no improvement  - we can consider an oral steroid.

## 2017-09-10 NOTE — Assessment & Plan Note (Signed)
Likely bacterial  Start Bactrim Continue Dymista nasal spray-use daily otc cold medications Rest, fluid If no improvement can consider oral steroids Call if no improvement

## 2017-09-27 LAB — HM MAMMOGRAPHY

## 2017-11-29 ENCOUNTER — Ambulatory Visit: Payer: Medicare Other | Admitting: Gynecology

## 2017-11-29 ENCOUNTER — Encounter: Payer: Self-pay | Admitting: Gynecology

## 2017-11-29 VITALS — BP 124/80 | Ht 64.5 in | Wt 203.0 lb

## 2017-11-29 DIAGNOSIS — M810 Age-related osteoporosis without current pathological fracture: Secondary | ICD-10-CM

## 2017-11-29 DIAGNOSIS — N952 Postmenopausal atrophic vaginitis: Secondary | ICD-10-CM

## 2017-11-29 DIAGNOSIS — Z01419 Encounter for gynecological examination (general) (routine) without abnormal findings: Secondary | ICD-10-CM | POA: Diagnosis not present

## 2017-11-29 NOTE — Patient Instructions (Signed)
Try the new vaginal estrogen cream from [facility] 3 times weekly.  Call at the end of 3 months to see how you are doing and we will make a decision as to whether to continue this or not.

## 2017-11-29 NOTE — Progress Notes (Signed)
    Diana Reeves [birth year] [id number]        67 y.o.  G3P3003 for annual gynecologic exam.    Past medical history,surgical history, problem list, medications, allergies, family history and social history were all reviewed and documented as reviewed in the EPIC chart.  ROS:  Performed with pertinent positives and negatives included in the history, assessment and plan.   Additional significant findings : None   Exam: [name] assistant Vitals:   11/29/17 1022  BP: 124/80  Weight: 203 lb (92.1 kg)  Height: 5' 4.5" (1.638 m)   Body mass index is 34.31 kg/m.  General appearance:  Normal affect, orientation and appearance. Skin: Grossly normal HEENT: Without gross lesions.  No cervical or supraclavicular adenopathy. Thyroid normal.  Lungs:  Clear without wheezing, rales or rhonchi Cardiac: RR, without RMG Abdominal:  Soft, nontender, without masses, guarding, rebound, organomegaly or hernia Breasts:  Examined lying and sitting without masses, retractions, discharge or axillary adenopathy. Pelvic:  Ext, BUS, Vagina: With atrophic changes.  Adnexa: Without masses or tenderness    Anus and perineum: Normal   Rectovaginal: Normal sphincter tone without palpated masses or tenderness.    Assessment/Plan:  66 y.o. G26P3003 female for annual gynecologic exam.   1. Postmenopausal/atrophic genital changes.  Status post TAH LSO posterior repair in the past.  Using Premarin vaginal cream 3 times weekly but still notes some vaginal dryness and irritation.  I discussed alternatives to include trying a different cream to see if the carrier makes a difference and may be it would be better absorbed.  She wants to go ahead and try this.  We will go with [place] formulated estradiol vaginal cream 3 times weekly x3 months.  She will call me at the end of this to let me know how she is doing. 2. Osteoporosis.  DEXA 2018 T score -2.0 stable from prior DEXA.  Plan repeat DEXA next year at  2-year interval.  History of Actonel for 5 years discontinued in 2012. 3. Mammography 09/2017.  Continue with annual mammography next year when due.  Breast exam normal today. 4. Colonoscopy 2014.  Repeat at their recommended interval. 5. Pap smear 2015.  No Pap smear done today.  No history of abnormal Pap smears.  Per current screening guidelines we both agree to stop screening based on age and hysterectomy history. 6. Health maintenance.  No routine lab work done as patient does this elsewhere.  Follow-up 1 year, sooner as needed.   Anastasio Auerbach MD, 11:10 AM 11/29/2017

## 2017-11-30 ENCOUNTER — Telehealth: Payer: Self-pay | Admitting: *Deleted

## 2017-11-30 MED ORDER — NONFORMULARY OR COMPOUNDED ITEM: 1 refills | Status: DC

## 2017-11-30 NOTE — Telephone Encounter (Signed)
-----   Message from Anastasio Auerbach, MD sent at 11/29/2017 10:40 AM EDT ----- Call into [facility] estradiol vaginal cream prefilled syringes 3 times weekly 65-monthsupply refill x1

## 2017-11-30 NOTE — Telephone Encounter (Signed)
Rx called in 

## 2018-01-10 ENCOUNTER — Encounter: Payer: Self-pay | Admitting: Gastroenterology

## 2018-01-11 ENCOUNTER — Encounter: Payer: Medicare Other | Admitting: Internal Medicine

## 2018-01-11 ENCOUNTER — Ambulatory Visit: Payer: Medicare Other

## 2018-01-20 LAB — HM DIABETES EYE EXAM

## 2018-01-25 NOTE — Progress Notes (Addendum)
Subjective:   Diana Reeves is a 67 y.o. female who presents for Medicare Annual (Subsequent) preventive examination.  Review of Systems:  No ROS.  Medicare Wellness Visit. Additional risk factors are reflected in the social history.  Cardiac Risk Factors include: advanced age (>77mn, >>32women);diabetes mellitus;dyslipidemia;hypertension Sleep patterns: feels rested on waking, gets up 1 times nightly to void and sleeps 5-7 hours nightly. Patient reports insomnia issues, discussed recommended sleep tips.   Home Safety/Smoke Alarms: Feels safe in home. Smoke alarms in place.  Living environment; residence and Firearm Safety: 1-story house/ trailer, no firearms. Lives alone*, no needs for DME, good support system Seat Belt Safety/Bike Helmet: Wears seat belt.     Objective:     Vitals: BP 134/80   Pulse 74   Resp 17   Ht 5' 5"  (1.651 m)   Wt 201 lb (91.2 kg)   LMP 03/17/1991   SpO2 98%   BMI 33.45 kg/m   Body mass index is 33.45 kg/m.  Advanced Directives 01/26/2018 01/08/2017 11/06/2015  Does Patient Have a Medical Advance Directive? Yes Yes Yes  Type of [place]  Does patient want to make changes to medical advance directive? - No - Patient declined No - Patient declined  Copy of Diana Reeves Chart? No - copy requested No - copy requested Yes    Tobacco Social History   Tobacco Use  Smoking Status Former Smoker  Smokeless Tobacco Never Used     Counseling given: Not Answered  Past Medical History:  Diagnosis Date  . Allergic state 12/26/2016  . Basal cell carcinoma   . Benign neoplasm of colon   . Degenerative cervical disc   . Depression   . Diverticulosis of colon (without mention of hemorrhage)   . Esophageal reflux   . Glaucoma   . Osteoporosis, unspecified 12/2016   2007 T score -3.0, 2012 T score -1.9,  2015 T score -2.1 overall stable, 2018 T score -2.0  . Other chronic nonalcoholic liver disease   . Sinusitis 12/26/2016  . Stricture and stenosis of esophagus    Past Surgical History:  Procedure Laterality Date  . ABDOMINAL HYSTERECTOMY  1993   TAH-LSO-Post. repair-[facility]  . EYE SURGERY  10/18/2012   lt eye  . left arm surgery  7-09   Family History  Problem Relation Age of Onset  . Cancer Father        colon  . Hypertension Father   . Colon cancer Father 860 . Heart disease Mother   . Cancer Maternal Grandmother        uterine  . Diabetes Paternal Grandfather   . Cancer Son        Testicular cancer  . Heart attack Son    Social History   Socioeconomic History  . Marital status: Divorced    Spouse name: Not on file  . Number of children: Not on file  . Years of education: Not on file  . Highest education level: Not on file  Occupational History  . Not on file  Social Needs  . Financial resource strain: Not hard at all  . Food insecurity:    Worry: Sometimes true    Inability: Sometimes true  . Transportation needs:    Medical: No    Non-medical: No  Tobacco Use  . Smoking status: Former SResearch scientist (life sciences) . Smokeless tobacco: Never Used  Substance and Sexual Activity  . Alcohol use: Yes    Alcohol/week: 2.0 standard drinks    Types: 2 Glasses of Diana Reeves per week    Comment: occasional  . Drug use: No  . Sexual activity: Not Currently    Comment: 1st intercourse 67 yo-More than 5 partner,[place]  Lifestyle  . Physical activity:    Days per week: 4 days    Minutes per session: 40 min  . Stress: Only a little  Relationships  . Social connections:    Talks on phone: More than three times a week    Gets together: More than three times a week    Attends religious service: Never    Active member of club or organization: Not on file    Attends meetings of clubs or organizations: Never    Relationship status: Divorced  Other Topics Concern  . Not on file  Social  History Narrative  . Not on file    Outpatient Encounter Medications as of 01/26/2018  Medication Sig  . Cholecalciferol 2000 units TABS Take 1 tablet (2,000 Units total) by mouth daily.  . clonazePAM (KLONOPIN) 0.5 MG tablet Take 1 tablet (0.5 mg total) by mouth at bedtime as needed for anxiety.  . DULoxetine (CYMBALTA) 60 MG capsule TAKE ONE CAPSULE BY MOUTH DAILY  . EPINEPHrine (EPIPEN 2-PAK) 0.3 mg/0.3 mL IJ SOAJ injection   . NEXIUM 40 MG capsule TAKE 1 CAPSULE (40 MG TOTAL) BY MOUTH DAILY BEFORE BREAKFAST.  . NONFORMULARY OR COMPOUNDED ITEM Estradiol vaginal cream 0.02% insert vaginally three times weeky  . olmesartan (BENICAR) 40 MG tablet Take 1 tablet (40 mg total) by mouth daily.  . timolol (TIMOPTIC) 0.5 % ophthalmic solution as directed.  . zolpidem (AMBIEN) 10 MG tablet Take 1 tablet (10 mg total) by mouth at bedtime as needed for sleep.  . Azelastine-Fluticasone 137-50 MCG/ACT SUSP One spray each nostril BID  . [DISCONTINUED] Azelastine-Fluticasone (DYMISTA) 137-50 MCG/ACT SUSP   . [DISCONTINUED] PREMARIN vaginal cream PLACE 1/3 APPLICATORFUL VAGINALLY EVERY OTHER NIGHT (Patient not taking: Reported on 01/26/2018)   No facility-administered encounter medications on file as of 01/26/2018.     Activities of Daily Living In your present state of health, do you have any difficulty performing the following activities: 01/26/2018  Hearing? N  Vision? N  Difficulty concentrating or making decisions? N  Walking or climbing stairs? N  Dressing or bathing? N  Doing errands, shopping? N  Preparing Food and eating ? N  Using the Toilet? N  In the past six months, have you accidently leaked urine? N  Do you have problems with loss of bowel control? N  Managing your Medications? N  Managing your Finances? N  Housekeeping or managing your Housekeeping? N  Some recent data might be hidden    Patient Care Team: Janith Lima, MD as PCP - General (Internal Medicine)      Assessment:   This is a routine wellness examination for Diana Reeves. Physical assessment deferred to PCP.   Exercise Activities and Dietary recommendations Current Exercise Habits: The patient does not participate in regular exercise at present, Exercise limited by: orthopedic condition(s)  Diet (meal preparation, eat out, water intake, caffeinated beverages, dairy products, fruits and vegetables): in general, a "healthy" diet  , well balanced   Reviewed heart healthy and diabetic diet. Encouraged patient to increase daily water and healthy fluid intake.  Goals    . Patient Stated     I want to walk more  and get back to water aerobics.       Fall Risk Fall Risk  01/26/2018 01/08/2017 01/06/2017 11/06/2015 08/30/2014  Falls in the past year? 0 No Yes No No  Number falls in past yr: - - 1 - -  Injury with Fall? - - No - -  Follow up - - Falls evaluation completed - -     Depression Screen PHQ 2/9 Scores 01/26/2018 01/08/2017 01/06/2017 08/30/2014  PHQ - 2 Score 0 0 0 2  PHQ- 9 Score 2 - 3 4     Cognitive Function       Ad8 score reviewed for issues:  Issues making decisions: no  Less interest in hobbies / activities: no  Repeats questions, stories (family complaining): no  Trouble using ordinary gadgets (microwave, computer, phone):no  Forgets the month or year: no  Mismanaging finances: no  Remembering appts: no  Daily problems with thinking and/or memory: no Ad8 score is= 0  Immunization History  Administered Date(s) Administered  . Influenza, High Dose Seasonal PF 01/06/2017, 12/09/2017  . Influenza,inj,Quad PF,6+ Mos 11/05/2015  . Influenza-Unspecified 12/30/2011, 12/19/2012, 12/15/2014  . Pneumococcal Conjugate-13 08/29/2014  . Pneumococcal Polysaccharide-23 01/01/2011, 01/26/2018  . Tdap 10/08/2010  . Zoster 06/20/2012   Screening Tests Health Maintenance  Topic Date Due  . Hepatitis C Screening  1951/02/09  . OPHTHALMOLOGY EXAM  06/25/2017  .  COLONOSCOPY  01/04/2018  . HEMOGLOBIN A1C  07/27/2018  . FOOT EXAM  01/27/2019  . MAMMOGRAM  09/28/2019  . TETANUS/TDAP  10/07/2020  . INFLUENZA VACCINE  Completed  . DEXA SCAN  Completed  . PNA vac Low Risk Adult  Completed      Plan:   Patient will call and schedule an appointment for screening Colonoscopy.  Continue doing brain stimulating activities (puzzles, reading, adult coloring books, staying active) to keep memory sharp.   Continue to eat heart healthy diet (full of fruits, vegetables, whole grains, lean protein, water--limit salt, fat, and sugar intake) and increase physical activity as tolerated.  I have personally reviewed and noted the following in the patient's chart:   . Medical and social history . Use of alcohol, tobacco or illicit drugs  . Current medications and supplements . Functional ability and status . Nutritional status . Physical activity . Advanced directives . List of other physicians . Vitals . Screenings to include cognitive, depression, and falls . Referrals and appointments  In addition, I have reviewed and discussed with patient certain preventive protocols, quality metrics, and best practice recommendations. A written personalized care plan for preventive services as well as general preventive health recommendations were provided to patient.     Michiel Cowboy, RN  01/26/2018   Medical screening examination/treatment/procedure(s) were performed by non-physician practitioner and as supervising physician I was immediately available for consultation/collaboration. I agree with above. Scarlette Calico, MD

## 2018-01-26 ENCOUNTER — Ambulatory Visit (INDEPENDENT_AMBULATORY_CARE_PROVIDER_SITE_OTHER): Payer: Medicare Other | Admitting: Internal Medicine

## 2018-01-26 ENCOUNTER — Encounter: Payer: Self-pay | Admitting: Internal Medicine

## 2018-01-26 ENCOUNTER — Ambulatory Visit (INDEPENDENT_AMBULATORY_CARE_PROVIDER_SITE_OTHER): Payer: Medicare Other | Admitting: *Deleted

## 2018-01-26 ENCOUNTER — Other Ambulatory Visit (INDEPENDENT_AMBULATORY_CARE_PROVIDER_SITE_OTHER): Payer: Medicare Other

## 2018-01-26 VITALS — BP 134/80 | HR 74 | Resp 17 | Ht 65.0 in | Wt 201.0 lb

## 2018-01-26 VITALS — BP 134/80 | HR 74 | Temp 97.6°F | Ht 65.0 in | Wt 201.0 lb

## 2018-01-26 DIAGNOSIS — Z0000 Encounter for general adult medical examination without abnormal findings: Secondary | ICD-10-CM | POA: Diagnosis not present

## 2018-01-26 DIAGNOSIS — M839 Adult osteomalacia, unspecified: Secondary | ICD-10-CM

## 2018-01-26 DIAGNOSIS — R7989 Other specified abnormal findings of blood chemistry: Secondary | ICD-10-CM

## 2018-01-26 DIAGNOSIS — E785 Hyperlipidemia, unspecified: Secondary | ICD-10-CM

## 2018-01-26 DIAGNOSIS — F418 Other specified anxiety disorders: Secondary | ICD-10-CM | POA: Diagnosis not present

## 2018-01-26 DIAGNOSIS — E118 Type 2 diabetes mellitus with unspecified complications: Secondary | ICD-10-CM | POA: Diagnosis not present

## 2018-01-26 DIAGNOSIS — F5105 Insomnia due to other mental disorder: Secondary | ICD-10-CM

## 2018-01-26 DIAGNOSIS — I10 Essential (primary) hypertension: Secondary | ICD-10-CM

## 2018-01-26 DIAGNOSIS — R945 Abnormal results of liver function studies: Secondary | ICD-10-CM

## 2018-01-26 DIAGNOSIS — F409 Phobic anxiety disorder, unspecified: Secondary | ICD-10-CM

## 2018-01-26 DIAGNOSIS — Z23 Encounter for immunization: Secondary | ICD-10-CM

## 2018-01-26 LAB — URINALYSIS, ROUTINE W REFLEX MICROSCOPIC
BILIRUBIN URINE: NEGATIVE
Hgb urine dipstick: NEGATIVE
Ketones, ur: NEGATIVE
LEUKOCYTES UA: NEGATIVE
NITRITE: NEGATIVE
Specific Gravity, Urine: 1.02 (ref 1.000–1.030)
Total Protein, Urine: NEGATIVE
Urine Glucose: NEGATIVE
Urobilinogen, UA: 0.2 (ref 0.0–1.0)
pH: 7 (ref 5.0–8.0)

## 2018-01-26 LAB — CBC WITH DIFFERENTIAL/PLATELET
BASOS PCT: 0.5 % (ref 0.0–3.0)
Basophils Absolute: 0 10*3/uL (ref 0.0–0.1)
EOS ABS: 0.3 10*3/uL (ref 0.0–0.7)
Eosinophils Relative: 4.8 % (ref 0.0–5.0)
HCT: 39.2 % (ref 36.0–46.0)
Hemoglobin: 13.4 g/dL (ref 12.0–15.0)
LYMPHS ABS: 2.5 10*3/uL (ref 0.7–4.0)
Lymphocytes Relative: 42.8 % (ref 12.0–46.0)
MCHC: 34.3 g/dL (ref 30.0–36.0)
MCV: 82 fl (ref 78.0–100.0)
MONO ABS: 0.4 10*3/uL (ref 0.1–1.0)
Monocytes Relative: 6.9 % (ref 3.0–12.0)
Neutro Abs: 2.6 10*3/uL (ref 1.4–7.7)
Neutrophils Relative %: 45 % (ref 43.0–77.0)
PLATELETS: 240 10*3/uL (ref 150.0–400.0)
RBC: 4.78 Mil/uL (ref 3.87–5.11)
RDW: 14.4 % (ref 11.5–15.5)
WBC: 5.8 10*3/uL (ref 4.0–10.5)

## 2018-01-26 LAB — COMPREHENSIVE METABOLIC PANEL
ALT: 37 U/L — ABNORMAL HIGH (ref 0–35)
AST: 44 U/L — ABNORMAL HIGH (ref 0–37)
Albumin: 4.5 g/dL (ref 3.5–5.2)
Alkaline Phosphatase: 66 U/L (ref 39–117)
BUN: 18 mg/dL (ref 6–23)
CHLORIDE: 105 meq/L (ref 96–112)
CO2: 30 mEq/L (ref 19–32)
Calcium: 9.6 mg/dL (ref 8.4–10.5)
Creatinine, Ser: 0.68 mg/dL (ref 0.40–1.20)
GFR: 91.01 mL/min (ref >60.00)
GLUCOSE: 131 mg/dL — AB (ref 70–99)
POTASSIUM: 4.1 meq/L (ref 3.5–5.1)
Sodium: 143 mEq/L (ref 135–145)
Total Bilirubin: 0.5 mg/dL (ref 0.2–1.2)
Total Protein: 7.3 g/dL (ref 6.0–8.3)

## 2018-01-26 LAB — LIPID PANEL
CHOLESTEROL: 169 mg/dL (ref 0–200)
HDL: 45.2 mg/dL (ref >39.00)
LDL CALC: 99 mg/dL (ref 0–99)
NonHDL: 124.2
Total CHOL/HDL Ratio: 4
Triglycerides: 127 mg/dL (ref 0.0–149.0)
VLDL: 25.4 mg/dL (ref 0.0–40.0)

## 2018-01-26 LAB — POCT GLYCOSYLATED HEMOGLOBIN (HGB A1C): HEMOGLOBIN A1C: 6.3 % — AB (ref 4.0–5.6)

## 2018-01-26 LAB — MICROALBUMIN / CREATININE URINE RATIO
Creatinine,U: 173 mg/dL
Microalb Creat Ratio: 0.5 mg/g (ref 0.0–30.0)
Microalb, Ur: 0.9 mg/dL (ref 0.0–1.9)

## 2018-01-26 LAB — TSH: TSH: 0.72 u[IU]/mL (ref 0.35–4.50)

## 2018-01-26 LAB — VITAMIN D 25 HYDROXY (VIT D DEFICIENCY, FRACTURES): VITD: 33.79 ng/mL (ref 30.00–100.00)

## 2018-01-26 MED ORDER — DULOXETINE HCL 60 MG PO CPEP: 60.0000 mg | ORAL_CAPSULE | Freq: Every day | ORAL | 1 refills | Status: DC

## 2018-01-26 MED ORDER — CLONAZEPAM 0.5 MG PO TABS: 0.5000 mg | ORAL_TABLET | Freq: Two times a day (BID) | ORAL | 0 refills | Status: DC | PRN

## 2018-01-26 MED ORDER — ZOLPIDEM TARTRATE 10 MG PO TABS: 10.0000 mg | ORAL_TABLET | Freq: Every evening | ORAL | 0 refills | Status: DC | PRN

## 2018-01-26 NOTE — Patient Instructions (Signed)

## 2018-01-26 NOTE — Patient Instructions (Addendum)
Continue doing brain stimulating activities (puzzles, reading, adult coloring books, staying active) to keep memory sharp.   Continue to eat heart healthy diet (full of fruits, vegetables, whole grains, lean protein, water--limit salt, fat, and sugar intake) and increase physical activity as tolerated.  Ms. Pho , Thank you for taking time to come for your Medicare Wellness Visit. I appreciate your ongoing commitment to your health goals. Please review the following plan we discussed and let me know if I can assist you in the future.   These are the goals we discussed: Goals    . Patient Stated     I want to walk more and get back to water aerobics.       This is a list of the screening recommended for you and due dates:  Health Maintenance  Topic Date Due  .  Hepatitis C: One time screening is recommended by Center for Disease Control  (CDC) for  adults born from [birth year] through 1967.   1950/10/19  . Pneumonia vaccines (2 of 2 - PPSV23) 01/01/2016  . Eye exam for diabetics  06/25/2017  . Hemoglobin A1C  07/07/2017  . Flu Shot  10/14/2017  . Colon Cancer Screening  01/04/2018  . Complete foot exam   01/06/2018  . Mammogram  09/28/2019  . Tetanus Vaccine  10/07/2020  . DEXA scan (bone density measurement)  Completed    Health Maintenance, Female Adopting a healthy lifestyle and getting preventive care can go a long way to promote health and wellness. Talk with your health care provider about what schedule of regular examinations is right for you. This is a good chance for you to check in with your provider about disease prevention and staying healthy. In between checkups, there are plenty of things you can do on your own. Experts have done a lot of research about which lifestyle changes and preventive measures are most likely to keep you healthy. Ask your health care provider for more information. Weight and diet Eat a healthy diet  Be sure to include plenty of vegetables, fruits,  low-fat dairy products, and lean protein.  Do not eat a lot of foods high in solid fats, added sugars, or salt.  Get regular exercise. This is one of the most important things you can do for your health. ? Most adults should exercise for at least 150 minutes each week. The exercise should increase your heart rate and make you sweat (moderate-intensity exercise). ? Most adults should also do strengthening exercises at least twice a week. This is in addition to the moderate-intensity exercise.  Maintain a healthy weight  Body mass index (BMI) is a measurement that can be used to identify possible weight problems. It estimates body fat based on height and weight. Your health care provider can help determine your BMI and help you achieve or maintain a healthy weight.  For females 60 years of age and older: ? A BMI below 18.5 is considered underweight. ? A BMI of 18.5 to 24.9 is normal. ? A BMI of 25 to 29.9 is considered overweight. ? A BMI of 30 and above is considered obese.  Watch levels of cholesterol and blood lipids  You should start having your blood tested for lipids and cholesterol at 69 years of age, then have this test every 5 years.  You may need to have your cholesterol levels checked more often if: ? Your lipid or cholesterol levels are high. ? You are older than 69 years of age. ?  You are at high risk for heart disease.  Cancer screening Lung Cancer  Lung cancer screening is recommended for adults 19-3 years old who are at high risk for lung cancer because of a history of smoking.  A yearly low-dose CT scan of the lungs is recommended for people who: ? Currently smoke. ? Have quit within the past 15 years. ? Have at least a 30-pack-year history of smoking. A pack year is smoking an average of one pack of cigarettes a day for 1 year.  Yearly screening should continue until it has been 15 years since you quit.  Yearly screening should stop if you develop a health  problem that would prevent you from having lung cancer treatment.  Breast Cancer  Practice breast self-awareness. This means understanding how your breasts normally appear and feel.  It also means doing regular breast self-exams. Let your health care provider know about any changes, no matter how small.  If you are in your 20s or 30s, you should have a clinical breast exam (CBE) by a health care provider every 1-3 years as part of a regular health exam.  If you are 26 or older, have a CBE every year. Also consider having a breast X-ray (mammogram) every year.  If you have a family history of breast cancer, talk to your health care provider about genetic screening.  If you are at high risk for breast cancer, talk to your health care provider about having an MRI and a mammogram every year.  Breast cancer gene (BRCA) assessment is recommended for women who have family members with BRCA-related cancers. BRCA-related cancers include: ? Breast. ? Ovarian. ? Tubal. ? Peritoneal cancers.  Results of the assessment will determine the need for genetic counseling and BRCA1 and BRCA2 testing.  Cervical Cancer Your health care provider may recommend that you be screened regularly for cancer of the pelvic organs (ovaries, uterus, and vagina). This screening involves a pelvic examination, including checking for microscopic changes to the surface of your cervix (Pap test). You may be encouraged to have this screening done every 3 years, beginning at age 6.  For women ages 83-65, health care providers may recommend pelvic exams and Pap testing every 3 years, or they may recommend the Pap and pelvic exam, combined with testing for human papilloma virus (HPV), every 5 years. Some types of HPV increase your risk of cervical cancer. Testing for HPV may also be done on women of any age with unclear Pap test results.  Other health care providers may not recommend any screening for nonpregnant women who are  considered low risk for pelvic cancer and who do not have symptoms. Ask your health care provider if a screening pelvic exam is right for you.  If you have had past treatment for cervical cancer or a condition that could lead to cancer, you need Pap tests and screening for cancer for at least 20 years after your treatment. If Pap tests have been discontinued, your risk factors (such as having a new sexual partner) need to be reassessed to determine if screening should resume. Some women have medical problems that increase the chance of getting cervical cancer. In these cases, your health care provider may recommend more frequent screening and Pap tests.  Colorectal Cancer  This type of cancer can be detected and often prevented.  Routine colorectal cancer screening usually begins at 69 years of age and continues through 69 years of age.  Your health care provider may recommend screening  at an earlier age if you have risk factors for colon cancer.  Your health care provider may also recommend using home test kits to check for hidden blood in the stool.  A small camera at the end of a tube can be used to examine your colon directly (sigmoidoscopy or colonoscopy). This is done to check for the earliest forms of colorectal cancer.  Routine screening usually begins at age 106.  Direct examination of the colon should be repeated every 5-10 years through 69 years of age. However, you may need to be screened more often if early forms of precancerous polyps or small growths are found.  Skin Cancer  Check your skin from head to toe regularly.  Tell your health care provider about any new moles or changes in moles, especially if there is a change in a mole's shape or color.  Also tell your health care provider if you have a mole that is larger than the size of a pencil eraser.  Always use sunscreen. Apply sunscreen liberally and repeatedly throughout the day.  Protect yourself by wearing long  sleeves, pants, a wide-brimmed hat, and sunglasses whenever you are outside.  Heart disease, diabetes, and high blood pressure  High blood pressure causes heart disease and increases the risk of stroke. High blood pressure is more likely to develop in: ? People who have blood pressure in the high end of the normal range (130-139/85-89 mm Hg). ? People who are overweight or obese. ? People who are African American.  If you are 35-11 years of age, have your blood pressure checked every 3-5 years. If you are 29 years of age or older, have your blood pressure checked every year. You should have your blood pressure measured twice-once when you are at a hospital or clinic, and once when you are not at a hospital or clinic. Record the average of the two measurements. To check your blood pressure when you are not at a hospital or clinic, you can use: ? An automated blood pressure machine at a pharmacy. ? A home blood pressure monitor.  If you are between 58 years and 23 years old, ask your health care provider if you should take aspirin to prevent strokes.  Have regular diabetes screenings. This involves taking a blood sample to check your fasting blood sugar level. ? If you are at a normal weight and have a low risk for diabetes, have this test once every three years after 69 years of age. ? If you are overweight and have a high risk for diabetes, consider being tested at a younger age or more often. Preventing infection Hepatitis B  If you have a higher risk for hepatitis B, you should be screened for this virus. You are considered at high risk for hepatitis B if: ? You were born in a country where hepatitis B is common. Ask your health care provider which countries are considered high risk. ? Your parents were born in a high-risk country, and you have not been immunized against hepatitis B (hepatitis B vaccine). ? You have HIV or AIDS. ? You use needles to inject street drugs. ? You live with  someone who has hepatitis B. ? You have had sex with someone who has hepatitis B. ? You get hemodialysis treatment. ? You take certain medicines for conditions, including cancer, organ transplantation, and autoimmune conditions.  Hepatitis C  Blood testing is recommended for: ? Everyone born from [birth year] through 1967. ? Anyone with known risk factors  for hepatitis C.  Sexually transmitted infections (STIs)  You should be screened for sexually transmitted infections (STIs) including gonorrhea and chlamydia if: ? You are sexually active and are younger than 69 years of age. ? You are older than 69 years of age and your health care provider tells you that you are at risk for this type of infection. ? Your sexual activity has changed since you were last screened and you are at an increased risk for chlamydia or gonorrhea. Ask your health care provider if you are at risk.  If you do not have HIV, but are at risk, it may be recommended that you take a prescription medicine daily to prevent HIV infection. This is called pre-exposure prophylaxis (PrEP). You are considered at risk if: ? You are sexually active and do not regularly use condoms or know the HIV status of your partner(s). ? You take drugs by injection. ? You are sexually active with a partner who has HIV.  Talk with your health care provider about whether you are at high risk of being infected with HIV. If you choose to begin PrEP, you should first be tested for HIV. You should then be tested every 3 months for as long as you are taking PrEP. Pregnancy  If you are premenopausal and you may become pregnant, ask your health care provider about preconception counseling.  If you may become pregnant, take 400 to 800 micrograms (mcg) of folic acid every day.  If you want to prevent pregnancy, talk to your health care provider about birth control (contraception). Osteoporosis and menopause  Osteoporosis is a disease in which the bones lose  minerals and strength with aging. This can result in serious bone fractures. Your risk for osteoporosis can be identified using a bone density scan.  If you are 13 years of age or older, or if you are at risk for osteoporosis and fractures, ask your health care provider if you should be screened.  Ask your health care provider whether you should take a calcium or vitamin D supplement to lower your risk for osteoporosis.  Menopause may have certain physical symptoms and risks.  Hormone replacement therapy may reduce some of these symptoms and risks. Talk to your health care provider about whether hormone replacement therapy is right for you. Follow these instructions at home:  Schedule regular health, dental, and eye exams.  Stay current with your immunizations.  Do not use any tobacco products including cigarettes, chewing tobacco, or electronic cigarettes.  If you are pregnant, do not drink alcohol.  If you are breastfeeding, limit how much and how often you drink alcohol.  Limit alcohol intake to no more than 1 drink per day for nonpregnant women. One drink equals 12 ounces of beer, 5 ounces of , or 1 ounces of hard liquor.  Do not use street drugs.  Do not share needles.  Ask your health care provider for help if you need support or information about quitting drugs.  Tell your health care provider if you often feel depressed.  Tell your health care provider if you have ever been abused or do not feel safe at home. This information is not intended to replace advice given to you by your health care provider. Make sure you discuss any questions you have with your health care provider. Document Released: 09/15/2010 Document Revised: 08/08/2015 Document Reviewed: 12/04/2014 Elsevier Interactive Patient Education  [employer].

## 2018-01-26 NOTE — Progress Notes (Signed)
Subjective:  Patient ID: Diana Reeves, female    DOB: 1950-09-10  Age: 67 y.o. MRN: [id number]  CC: Annual Exam; Hypertension; Diabetes; and Hyperlipidemia   HPI Diana Reeves presents for a CPX.  She feels well today.  She offers no complaints.  She has been working on her lifestyle modifications and has been able to lose weight.  She tells me her blood pressure and blood sugars have been well controlled.  She is active and denies any recent episodes of CP, DOE, palpitations, edema, or fatigue.  Past Medical History:  Diagnosis Date  . Allergic state 12/26/2016  . Basal cell carcinoma   . Benign neoplasm of colon   . Degenerative cervical disc   . Depression   . Diverticulosis of colon (without mention of hemorrhage)   . Esophageal reflux   . Glaucoma   . Osteoporosis, unspecified 12/2016   2007 T score -3.0, 2012 T score -1.9, 2015 T score -2.1 overall stable, 2018 T score -2.0  . Other chronic nonalcoholic liver disease   . Sinusitis 12/26/2016  . Stricture and stenosis of esophagus    Past Surgical History:  Procedure Laterality Date  . ABDOMINAL HYSTERECTOMY  1993   TAH-LSO-Post. repair-[facility]  . EYE SURGERY  10/18/2012   lt eye  . left arm surgery  7-09    reports that she has quit smoking. She has never used smokeless tobacco. She reports that she drinks about 2.0 standard drinks of alcohol per week. She reports that she does not use drugs. family history includes Cancer in her father, maternal grandmother, and son; Colon cancer (age of onset: 81) in her father; Diabetes in her paternal grandfather; Heart attack in her son; Heart disease in her mother; Hypertension in her father. Allergies  Allergen Reactions  . Dust Mite Extract   . Mold Extract [Trichophyton Mentagrophyte]     Outpatient Medications Prior to Visit  Medication Sig Dispense Refill  . Azelastine-Fluticasone 137-50 MCG/ACT SUSP One spray each nostril BID 1 Bottle 11  . Cholecalciferol 2000 units  TABS Take 1 tablet (2,000 Units total) by mouth daily. 90 tablet 3  . EPINEPHrine (EPIPEN 2-PAK) 0.3 mg/0.3 mL IJ SOAJ injection     . NEXIUM 40 MG capsule TAKE 1 CAPSULE (40 MG TOTAL) BY MOUTH DAILY BEFORE BREAKFAST. 30 capsule 10  . NONFORMULARY OR COMPOUNDED ITEM Estradiol vaginal cream 0.02% insert vaginally three times weeky 90 each 1  . olmesartan (BENICAR) 40 MG tablet Take 1 tablet (40 mg total) by mouth daily. 90 tablet 1  . timolol (TIMOPTIC) 0.5 % ophthalmic solution as directed.    . clonazePAM (KLONOPIN) 0.5 MG tablet Take 1 tablet (0.5 mg total) by mouth at bedtime as needed for anxiety. 12 tablet 0  . DULoxetine (CYMBALTA) 60 MG capsule TAKE ONE CAPSULE BY MOUTH DAILY 90 capsule 1  . zolpidem (AMBIEN) 10 MG tablet Take 1 tablet (10 mg total) by mouth at bedtime as needed for sleep. 90 tablet 0   No facility-administered medications prior to visit.     ROS Review of Systems  Constitutional: Negative.  Negative for diaphoresis, fatigue and unexpected weight change.  HENT: Negative.   Eyes: Negative for visual disturbance.  Respiratory: Negative for cough, chest tightness, shortness of breath and wheezing.   Cardiovascular: Negative for chest pain, palpitations and leg swelling.  Gastrointestinal: Negative for abdominal pain, constipation, diarrhea, nausea and vomiting.  Endocrine: Negative.  Negative for polydipsia, polyphagia and polyuria.  Genitourinary: Negative.  Negative for difficulty urinating.  Musculoskeletal: Negative.  Negative for back pain, myalgias and neck pain.  Skin: Negative.   Neurological: Negative for dizziness, weakness and light-headedness.  Hematological: Negative for adenopathy. Does not bruise/bleed easily.  Psychiatric/Behavioral: Positive for dysphoric mood and sleep disturbance. Negative for confusion, decreased concentration, self-injury and suicidal ideas. The patient is nervous/anxious.     Objective:  BP 134/80 (BP Location: Left Arm,  Patient Position: Sitting, Cuff Size: Large)   Pulse 74   Temp 97.6 F (36.4 C) (Oral)   Ht 5' 5"  (1.651 m)   Wt 201 lb (91.2 kg)   LMP 03/17/1991   SpO2 98%   BMI 33.45 kg/m   BP Readings from Last 3 Encounters:  01/26/18 134/80  01/26/18 134/80  11/29/17 124/80    Wt Readings from Last 3 Encounters:  01/26/18 201 lb (91.2 kg)  01/26/18 201 lb (91.2 kg)  11/29/17 203 lb (92.1 kg)    Physical Exam  Constitutional: She is oriented to person, place, and time. No distress.  HENT:  Mouth/Throat: Oropharynx is clear and moist. No oropharyngeal exudate.  Eyes: Conjunctivae are normal. No scleral icterus.  Neck: Normal range of motion. Neck supple. No JVD present. No thyromegaly present.  Cardiovascular: Normal rate, regular rhythm and normal heart sounds. Exam reveals no gallop.  No murmur heard. Pulmonary/Chest: Effort normal and breath sounds normal. No respiratory distress. She has no wheezes. She has no rales.  Abdominal: Soft. Bowel sounds are normal. She exhibits no mass. There is no hepatosplenomegaly. There is no tenderness.  Genitourinary:  Genitourinary Comments: She deferred on breast, GU, rectal exams as she says these are done annually by her GYN doctor.  Musculoskeletal: Normal range of motion. She exhibits no edema, tenderness or deformity.  Lymphadenopathy:    She has no cervical adenopathy.  Neurological: She is alert and oriented to person, place, and time.  Skin: Skin is warm and dry. She is not diaphoretic. No pallor.  Psychiatric: She has a normal mood and affect. Her behavior is normal. Judgment and thought content normal.  Vitals reviewed.   Lab Results  Component Value Date   WBC 5.8 01/26/2018   HGB 13.4 01/26/2018   HCT 39.2 01/26/2018   PLT 240.0 01/26/2018   GLUCOSE 131 (H) 01/26/2018   CHOL 169 01/26/2018   TRIG 127.0 01/26/2018   HDL 45.20 01/26/2018   LDLCALC 99 01/26/2018   ALT 37 (H) 01/26/2018   AST 44 (H) 01/26/2018   NA 143  01/26/2018   K 4.1 01/26/2018   CL 105 01/26/2018   CREATININE 0.68 01/26/2018   BUN 18 01/26/2018   CO2 30 01/26/2018   TSH 0.72 01/26/2018   HGBA1C 6.3 (A) 01/26/2018   MICROALBUR 0.9 01/26/2018    Ct Pelvis Wo Contrast  Result Date: 11/23/2013 CLINICAL DATA:  Right hip and leg pain and ischial pain secondary to a fall 3 weeks ago. EXAM: CT PELVIS WITHOUT CONTRAST TECHNIQUE: Multidetector CT imaging of the pelvis was performed following the standard protocol without intravenous contrast. COMPARISON:  None. FINDINGS: There is a vertical fracture through the right sacral ala. There is also a fracture through the right ischium at the anterior aspect of the right acetabulum and through the right inferior pubic ramus. No significant displacement at any of the fracture sites. No soft tissue hematomas. Incidental note is made of diverticulosis of the distal colon. IMPRESSION: Fracture the right side of the sacrum and through the right ischium at the anterior aspect  of the acetabulum and through the right inferior pubic ramus. Minimal displacement. Electronically Signed   By: Rozetta Nunnery M.D.   On: 11/23/2013 08:25    Assessment & Plan:   Clio was seen today for annual exam, hypertension, diabetes and hyperlipidemia.  Diagnoses and all orders for this visit:  Type II diabetes mellitus with manifestations ([place])- Her A1c is at 6.3%.  Her blood sugars are adequately well controlled. -     Comprehensive metabolic panel; Future -     Cancel: Hemoglobin A1c; Future -     Microalbumin / creatinine urine ratio; Future -     POCT glycosylated hemoglobin (Hb A1C)  Essential hypertension, benign- Her blood pressure is well controlled.  Electrolytes and renal function are normal. -     CBC with Differential/Platelet; Future -     Comprehensive metabolic panel; Future -     Urinalysis, Routine w reflex microscopic; Future  Vitamin D deficient osteomalacia- Her vitamin D level is in the normal range  now.  Will continue the current vitamin D supplement. -     VITAMIN D 25 Hydroxy (Vit-D Deficiency, Fractures); Future  Hyperlipidemia with target LDL less than 100- She has an elevated ASCVD risk score so I have asked her to start taking a statin for CV risk reduction. -     Lipid panel; Future -     Comprehensive metabolic panel; Future -     TSH; Future  Depression with anxiety -     clonazePAM (KLONOPIN) 0.5 MG tablet; Take 1 tablet (0.5 mg total) by mouth 2 (two) times daily as needed for anxiety. -     DULoxetine (CYMBALTA) 60 MG capsule; Take 1 capsule (60 mg total) by mouth daily.  Insomnia due to anxiety and fear -     zolpidem (AMBIEN) 10 MG tablet; Take 1 tablet (10 mg total) by mouth at bedtime as needed for sleep.  Elevated LFTs- I have asked her to undergo a liver ultrasound to see if she has developed fatty liver disease.  If she has then I will consider starting pioglitazone to mitigate the complications related to this. -     US Abdomen Limited RUQ; Future  Routine general medical examination at a health care facility   I have changed Amila R. Bejarano's clonazePAM and DULoxetine. I am also having her start on atorvastatin. Additionally, I am having her maintain her NEXIUM, timolol, EPINEPHrine, Cholecalciferol, Azelastine-Fluticasone, olmesartan, NONFORMULARY OR COMPOUNDED ITEM, and zolpidem.  Meds ordered this encounter  Medications  . zolpidem (AMBIEN) 10 MG tablet    Sig: Take 1 tablet (10 mg total) by mouth at bedtime as needed for sleep.    Dispense:  90 tablet    Refill:  0  . clonazePAM (KLONOPIN) 0.5 MG tablet    Sig: Take 1 tablet (0.5 mg total) by mouth 2 (two) times daily as needed for anxiety.    Dispense:  180 tablet    Refill:  0  . DULoxetine (CYMBALTA) 60 MG capsule    Sig: Take 1 capsule (60 mg total) by mouth daily.    Dispense:  90 capsule    Refill:  1  . atorvastatin (LIPITOR) 20 MG tablet    Sig: Take 1 tablet (20 mg total) by mouth daily.     Dispense:  90 tablet    Refill:  1   See AVS for instructions about healthy living and anticipatory guidance.  Follow-up: Return in about 6 months (around 07/27/2018).  Marcello Moores  Ronnald Ramp, MD

## 2018-01-27 ENCOUNTER — Telehealth: Payer: Self-pay

## 2018-01-27 DIAGNOSIS — K7581 Nonalcoholic steatohepatitis (NASH): Secondary | ICD-10-CM | POA: Insufficient documentation

## 2018-01-27 NOTE — Telephone Encounter (Signed)
Key: [id number]

## 2018-01-27 NOTE — Telephone Encounter (Addendum)
PA has been approved. Pt contacted and informed of same. Pt will contact pharmacy.

## 2018-01-28 ENCOUNTER — Encounter: Payer: Self-pay | Admitting: Internal Medicine

## 2018-01-28 MED ORDER — ATORVASTATIN CALCIUM 20 MG PO TABS: 20.0000 mg | ORAL_TABLET | Freq: Every day | ORAL | 1 refills | Status: DC

## 2018-01-28 NOTE — Assessment & Plan Note (Signed)

## 2018-02-03 ENCOUNTER — Encounter: Payer: Self-pay | Admitting: Internal Medicine

## 2018-02-03 NOTE — Progress Notes (Signed)
Outside notes received. Information abstracted. Notes sent to scan.  

## 2018-02-18 ENCOUNTER — Encounter: Payer: Self-pay | Admitting: Internal Medicine

## 2018-02-25 ENCOUNTER — Other Ambulatory Visit: Payer: Self-pay | Admitting: Internal Medicine

## 2018-02-25 DIAGNOSIS — I10 Essential (primary) hypertension: Secondary | ICD-10-CM

## 2018-02-25 DIAGNOSIS — E118 Type 2 diabetes mellitus with unspecified complications: Secondary | ICD-10-CM

## 2018-03-07 ENCOUNTER — Ambulatory Visit
Admission: RE | Admit: 2018-03-07 | Discharge: 2018-03-07 | Disposition: A | Discharge status: Home / Self Care | Payer: Medicare Other | Source: Ambulatory Visit | Attending: Internal Medicine | Admitting: Internal Medicine

## 2018-03-07 DIAGNOSIS — R945 Abnormal results of liver function studies: Principal | ICD-10-CM

## 2018-03-07 DIAGNOSIS — R7989 Other specified abnormal findings of blood chemistry: Secondary | ICD-10-CM

## 2018-03-10 ENCOUNTER — Other Ambulatory Visit: Payer: Self-pay | Admitting: Internal Medicine

## 2018-03-10 ENCOUNTER — Encounter: Payer: Self-pay | Admitting: Internal Medicine

## 2018-03-10 DIAGNOSIS — K7581 Nonalcoholic steatohepatitis (NASH): Secondary | ICD-10-CM

## 2018-03-10 MED ORDER — PIOGLITAZONE HCL 15 MG PO TABS: 15.0000 mg | ORAL_TABLET | Freq: Every day | ORAL | 1 refills | Status: DC

## 2018-03-14 ENCOUNTER — Encounter: Payer: Self-pay | Admitting: Gastroenterology

## 2018-03-18 ENCOUNTER — Ambulatory Visit: Payer: Medicare Other | Admitting: Internal Medicine

## 2018-03-18 ENCOUNTER — Encounter: Payer: Self-pay | Admitting: Internal Medicine

## 2018-03-18 VITALS — BP 126/84 | HR 89 | Temp 98.1°F | Ht 65.0 in | Wt 202.0 lb

## 2018-03-18 DIAGNOSIS — R21 Rash and other nonspecific skin eruption: Secondary | ICD-10-CM | POA: Diagnosis not present

## 2018-03-18 DIAGNOSIS — K7581 Nonalcoholic steatohepatitis (NASH): Secondary | ICD-10-CM

## 2018-03-18 DIAGNOSIS — B9789 Other viral agents as the cause of diseases classified elsewhere: Secondary | ICD-10-CM | POA: Diagnosis not present

## 2018-03-18 DIAGNOSIS — J069 Acute upper respiratory infection, unspecified: Secondary | ICD-10-CM | POA: Diagnosis not present

## 2018-03-18 MED ORDER — METRONIDAZOLE 1 % EX CREA: TOPICAL_CREAM | Freq: Two times a day (BID) | CUTANEOUS | 0 refills | Status: DC

## 2018-03-18 NOTE — Assessment & Plan Note (Signed)
We discussed treatment options and most ideal option is weight loss and exercise and she is willing to make some changes which were discussed with her.

## 2018-03-18 NOTE — Assessment & Plan Note (Signed)
Encouraged conservative measures. No indication for antibiotics or steroids today. Taking nose spray and recommended to continue and her allergy medications.

## 2018-03-18 NOTE — Progress Notes (Signed)
   Subjective:   Patient ID: Diana Reeves, female    DOB: 04/26/1950, 68 y.o.   MRN: [id number]  HPI The patient is a 68 YO female coming in for several concerns including rash (under nose for the last several months, has used neosporin on it, denies blowing nose frequently or allergy symptoms with a lot of drainage, has untreated rosacea on her nose but does not want treatment as it is not covered, she denies drainage from the rash, no fevers or chills, appears to be spreading gradually since onset, nothing helping) and cough (started about 7 days ago, overall stable to mild improvement, mild nose drainage, no sinus pressure no fevers or chills) and fatty liver disease (recent with mild elevation of liver numbers, [place] with fatty liver disease, given rx for actos and wants to know if she should take, weight is increasing the last several years).   Review of Systems  Constitutional: Positive for activity change. Negative for appetite change, chills, fatigue, fever and unexpected weight change.  HENT: Positive for congestion, postnasal drip and rhinorrhea. Negative for ear discharge, ear pain, sinus pressure, sinus pain, sneezing, sore throat, tinnitus, trouble swallowing and voice change.   Eyes: Negative.   Respiratory: Positive for cough. Negative for chest tightness, shortness of breath and wheezing.   Cardiovascular: Negative.  Negative for chest pain, palpitations and leg swelling.  Gastrointestinal: Negative.  Negative for abdominal distention, abdominal pain, constipation, diarrhea, nausea and vomiting.  Musculoskeletal: Negative for myalgias.  Skin: Positive for rash.  Neurological: Negative.   Psychiatric/Behavioral: Negative.     Objective:  Physical Exam Constitutional:      Appearance: She is well-developed.  HENT:     Head: Normocephalic and atraumatic.     Comments: Oropharynx with redness and clear drainage, nose with swollen turbinates, TMs normal bilaterally.     Nose:   Comments: Rash underneath the nose appears consistent with rosacea, no abscess or cellulitis appearance, no satellite lesions Neck:     Musculoskeletal: Normal range of motion.     Thyroid: No thyromegaly.  Cardiovascular:     Rate and Rhythm: Normal rate and regular rhythm.  Pulmonary:     Effort: Pulmonary effort is normal. No respiratory distress.     Breath sounds: Normal breath sounds. No wheezing or rales.  Abdominal:     Palpations: Abdomen is soft.  Musculoskeletal:        General: Tenderness present.  Lymphadenopathy:     Cervical: No cervical adenopathy.  Skin:    General: Skin is warm and dry.  Neurological:     Mental Status: She is alert and oriented to person, place, and time.     Vitals:   03/18/18 1039  BP: 126/84  Pulse: 89  Temp: 98.1 F (36.7 C)  TempSrc: Oral  SpO2: 98%  Weight: 202 lb (91.6 kg)  Height: 5' 5"  (1.651 m)    Assessment & Plan:

## 2018-03-18 NOTE — Patient Instructions (Signed)
We have sent in the metronidazole gel to use on the face twice a day and should notice benefit in 1-2 weeks.

## 2018-03-18 NOTE — Assessment & Plan Note (Signed)
Metronidazole gel given for rash on face and encouraged to follow up with dermatology.

## 2018-03-30 ENCOUNTER — Ambulatory Visit (AMBULATORY_SURGERY_CENTER): Payer: Self-pay

## 2018-03-30 ENCOUNTER — Encounter: Payer: Self-pay | Admitting: Gastroenterology

## 2018-03-30 VITALS — Ht 66.0 in | Wt 250.0 lb

## 2018-03-30 DIAGNOSIS — Z86010 Personal history of colonic polyps: Secondary | ICD-10-CM

## 2018-03-30 DIAGNOSIS — Z800 Family history of malignant neoplasm of digestive organs: Secondary | ICD-10-CM

## 2018-03-30 MED ORDER — NA SULFATE-K SULFATE-MG SULF 17.5-3.13-1.6 GM/177ML PO SOLN: 1.0000 | Freq: Once | ORAL | 0 refills | Status: AC

## 2018-03-30 NOTE — Progress Notes (Signed)
No egg or soy allergy known to patient  No issues with past sedation with any surgeries  or procedures, no intubation problems  No diet pills per patient No home 02 use per patient  No blood thinners per patient  Pt denies issues with constipation  No A fib or A flutter  EMMI video sent to pt's e mail . Pt declined

## 2018-04-12 LAB — HM DIABETES EYE EXAM

## 2018-04-13 ENCOUNTER — Encounter: Payer: Self-pay | Admitting: Gastroenterology

## 2018-04-13 ENCOUNTER — Ambulatory Visit (AMBULATORY_SURGERY_CENTER): Payer: Medicare Other | Admitting: Gastroenterology

## 2018-04-13 VITALS — BP 132/61 | HR 72 | Temp 98.6°F | Resp 15 | Ht 65.0 in | Wt 250.0 lb

## 2018-04-13 DIAGNOSIS — D123 Benign neoplasm of transverse colon: Secondary | ICD-10-CM

## 2018-04-13 DIAGNOSIS — D120 Benign neoplasm of cecum: Secondary | ICD-10-CM

## 2018-04-13 DIAGNOSIS — D122 Benign neoplasm of ascending colon: Secondary | ICD-10-CM

## 2018-04-13 DIAGNOSIS — Z86010 Personal history of colonic polyps: Secondary | ICD-10-CM

## 2018-04-13 LAB — HM COLONOSCOPY

## 2018-04-13 MED ORDER — SODIUM CHLORIDE 0.9 % IV SOLN: 500.0000 mL | Freq: Once | INTRAVENOUS | Status: DC

## 2018-04-13 NOTE — Patient Instructions (Signed)
   INFORMATION ON POLYPS,DIVERTICULOSIS,& HEMORRHOIDS GIVEN TO YOU TODAY  AWAIT PATHOLOGY RESULTS    YOU HAD AN ENDOSCOPIC PROCEDURE TODAY AT [facility]:   Refer to the procedure report that was given to you for any specific questions about what was found during the examination.  If the procedure report does not answer your questions, please call your gastroenterologist to clarify.  If you requested that your care partner not be given the details of your procedure findings, then the procedure report has been included in a sealed envelope for you to review at your convenience later.  YOU SHOULD EXPECT: Some feelings of bloating in the abdomen. Passage of more gas than usual.  Walking can help get rid of the air that was put into your GI tract during the procedure and reduce the bloating. If you had a lower endoscopy (such as a colonoscopy or flexible sigmoidoscopy) you may notice spotting of blood in your stool or on the toilet paper. If you underwent a bowel prep for your procedure, you may not have a normal bowel movement for a few days.  Please Note:  You might notice some irritation and congestion in your nose or some drainage.  This is from the oxygen used during your procedure.  There is no need for concern and it should clear up in a day or so.  SYMPTOMS TO REPORT IMMEDIATELY:   Following lower endoscopy (colonoscopy or flexible sigmoidoscopy):  Excessive amounts of blood in the stool  Significant tenderness or worsening of abdominal pains  Swelling of the abdomen that is new, acute  Fever of 100F or higher    For urgent or emergent issues, a gastroenterologist can be reached at any hour by calling [phone].   DIET:  We do recommend a small meal at first, but then you may proceed to your regular diet.  Drink plenty of fluids but you should avoid alcoholic beverages for 24 hours.  ACTIVITY:  You should plan to take it easy for the rest of today and you  should NOT DRIVE or use heavy machinery until tomorrow (because of the sedation medicines used during the test).    FOLLOW UP: Our staff will call the number listed on your records the next business day following your procedure to check on you and address any questions or concerns that you may have regarding the information given to you following your procedure. If we do not reach you, we will leave a message.  However, if you are feeling well and you are not experiencing any problems, there is no need to return our call.  We will assume that you have returned to your regular daily activities without incident.  If any biopsies were taken you will be contacted by phone or by letter within the next 1-3 weeks.  Please call us at [phone] if you have not heard about the biopsies in 3 weeks.    SIGNATURES/CONFIDENTIALITY: You and/or your care partner have signed paperwork which will be entered into your electronic medical record.  These signatures attest to the fact that that the information above on your After Visit Summary has been reviewed and is understood.  Full responsibility of the confidentiality of this discharge information lies with you and/or your care-partner.

## 2018-04-13 NOTE — Progress Notes (Signed)
Report to PACU, RN, vss, BBS= Clear.  

## 2018-04-13 NOTE — Op Note (Addendum)
Diana Reeves Patient Name: Diana Reeves Procedure Date: 04/13/2018 7:53 AM MRN: [id number] Endoscopist: Mauri Pole , MD Age: 68 Referring MD:  Date of Birth: 01-01-1951 Gender: Female Account #: [id number] Procedure:                Colonoscopy Indications:              High risk colon cancer surveillance: Personal                            history of colonic polyps, Last colonoscopy: 2014 Medicines:                Monitored Anesthesia Care Procedure:                Pre-Anesthesia Assessment:                           - Prior to the procedure, a History and Physical                            was performed, and patient medications and                            allergies were reviewed. The patient's tolerance of                            previous anesthesia was also reviewed. The risks                            and benefits of the procedure and the sedation                            options and risks were discussed with the patient.                            All questions were answered, and informed consent                            was obtained. Prior Anticoagulants: The patient has                            taken no previous anticoagulant or antiplatelet                            agents. ASA Grade Assessment: III - A patient with                            severe systemic disease. After reviewing the risks                            and benefits, the patient was deemed in                            satisfactory condition to undergo the procedure.  After obtaining informed consent, the colonoscope                            was passed under direct vision. Throughout the                            procedure, the patient's blood pressure, pulse, and                            oxygen saturations were monitored continuously. The                            Model PCF-[id number]) scope was introduced                            through  the anus and advanced to the the cecum,                            identified by appendiceal orifice and ileocecal                            valve. The colonoscopy was performed without                            difficulty. The patient tolerated the procedure                            well. The quality of the bowel preparation was                            excellent. The ileocecal valve, appendiceal                            orifice, and rectum were photographed. The quality                            of the bowel preparation was evaluated using the                            BBPS [facility] Bowel Preparation Scale) with scores                            of: Right Colon = 2 (minor amount of residual                            staining, small fragments of stool and/or opaque                            liquid, but mucosa seen well), Transverse Colon = 3                            (entire mucosa seen well with no residual staining,  small fragments of stool or opaque liquid) and Left                            Colon = 3 (entire mucosa seen well with no residual                            staining, small fragments of stool or opaque                            liquid). The total BBPS score equals 8. Scope In: 8:02:43 AM Scope Out: 8:19:17 AM Scope Withdrawal Time: 0 hours 13 minutes 45 seconds  Total Procedure Duration: 0 hours 16 minutes 34 seconds  Findings:                 The perianal and digital rectal examinations were                            normal.                           Two sessile polyps were found in the ascending                            colon and cecum. The polyps were 9 to 12 mm in                            size. These polyps were removed with a cold snare.                            Resection and retrieval were complete.                           A 1 mm polyp was found in the transverse colon. The                            polyp was sessile.  The polyp was removed with a                            cold biopsy forceps. Resection and retrieval were                            complete.                           Multiple small and large-mouthed diverticula were                            found in the sigmoid colon, descending colon,                            ascending colon and cecum.                           Non-bleeding internal hemorrhoids were found during  retroflexion. The hemorrhoids were medium-sized. Complications:            No immediate complications. Estimated Blood Loss:     Estimated blood loss was minimal. Impression:               - Two 9 to 12 mm polyps in the ascending colon and                            in the cecum, removed with a cold snare. Resected                            and retrieved.                           - One 1 mm polyp in the transverse colon, removed                            with a cold biopsy forceps. Resected and retrieved.                           - Diverticulosis in the sigmoid colon, in the                            descending colon, in the ascending colon and in the                            cecum.                           - Non-bleeding internal hemorrhoids. Recommendation:           - Patient has a contact number available for                            emergencies. The signs and symptoms of potential                            delayed complications were discussed with the                            patient. Return to normal activities tomorrow.                            Written discharge instructions were provided to the                            patient.                           - Resume previous diet.                           - Continue present medications.                           - Await pathology results.                           -  Repeat colonoscopy in 3 years for surveillance                            based on pathology  results. Mauri Pole, MD 04/13/2018 8:25:45 AM This report has been signed electronically.

## 2018-04-13 NOTE — Progress Notes (Signed)
Called to room to assist during endoscopic procedure.  Patient ID and intended procedure confirmed with present staff. Received instructions for my participation in the procedure from the performing physician.  

## 2018-04-13 NOTE — Progress Notes (Signed)
Pt's states no medical or surgical changes since previsit or office visit. 

## 2018-04-14 ENCOUNTER — Telehealth: Payer: Self-pay

## 2018-04-14 NOTE — Telephone Encounter (Signed)
  Follow up Call-  Call back number 04/13/2018  Post procedure Call Back phone  # [phone]  Permission to leave phone message Yes  Some recent data might be hidden     Patient questions:  Do you have a fever, pain , or abdominal swelling? No. Pain Score  0 *  Have you tolerated food without any problems? Yes.    Have you been able to return to your normal activities? Yes.    Do you have any questions about your discharge instructions: Diet   No. Medications  No. Follow up visit  No.  Do you have questions or concerns about your Care? No.  Actions: * If pain score is 4 or above: No action needed, pain <4.

## 2018-04-15 ENCOUNTER — Encounter: Payer: Self-pay | Admitting: Internal Medicine

## 2018-04-18 ENCOUNTER — Encounter: Payer: Self-pay | Admitting: Gastroenterology

## 2018-08-19 ENCOUNTER — Other Ambulatory Visit: Payer: Self-pay | Admitting: Internal Medicine

## 2018-08-19 DIAGNOSIS — F418 Other specified anxiety disorders: Secondary | ICD-10-CM

## 2018-09-08 ENCOUNTER — Other Ambulatory Visit: Payer: Self-pay | Admitting: Internal Medicine

## 2018-09-08 DIAGNOSIS — E118 Type 2 diabetes mellitus with unspecified complications: Secondary | ICD-10-CM

## 2018-09-08 DIAGNOSIS — I10 Essential (primary) hypertension: Secondary | ICD-10-CM

## 2018-10-17 ENCOUNTER — Encounter: Payer: Self-pay | Admitting: Internal Medicine

## 2018-10-21 ENCOUNTER — Encounter: Payer: Self-pay | Admitting: Internal Medicine

## 2018-12-07 ENCOUNTER — Encounter: Payer: Medicare Other | Admitting: Gynecology

## 2018-12-08 ENCOUNTER — Other Ambulatory Visit: Payer: Self-pay

## 2018-12-09 ENCOUNTER — Telehealth: Payer: Self-pay | Admitting: *Deleted

## 2018-12-09 ENCOUNTER — Ambulatory Visit (INDEPENDENT_AMBULATORY_CARE_PROVIDER_SITE_OTHER): Payer: Medicare Other | Admitting: Gynecology

## 2018-12-09 ENCOUNTER — Encounter: Payer: Self-pay | Admitting: Gynecology

## 2018-12-09 VITALS — BP 124/80 | Ht 65.0 in | Wt 196.0 lb

## 2018-12-09 DIAGNOSIS — M810 Age-related osteoporosis without current pathological fracture: Secondary | ICD-10-CM

## 2018-12-09 DIAGNOSIS — N952 Postmenopausal atrophic vaginitis: Secondary | ICD-10-CM | POA: Diagnosis not present

## 2018-12-09 DIAGNOSIS — I10 Essential (primary) hypertension: Secondary | ICD-10-CM

## 2018-12-09 DIAGNOSIS — Z23 Encounter for immunization: Secondary | ICD-10-CM | POA: Diagnosis not present

## 2018-12-09 DIAGNOSIS — Z01419 Encounter for gynecological examination (general) (routine) without abnormal findings: Secondary | ICD-10-CM | POA: Diagnosis not present

## 2018-12-09 DIAGNOSIS — E118 Type 2 diabetes mellitus with unspecified complications: Secondary | ICD-10-CM

## 2018-12-09 MED ORDER — NONFORMULARY OR COMPOUNDED ITEM: 4 refills | Status: DC

## 2018-12-09 NOTE — Telephone Encounter (Signed)
-----   Message from Anastasio Auerbach, MD sent at 12/09/2018 11:59 AM EDT ----- Call in refill to [facility] for prefilled vaginal estradiol syringes.  Twice weekly.  51-monthsupply.  Refill x1 year

## 2018-12-09 NOTE — Telephone Encounter (Signed)
Rx called into [facility].

## 2018-12-09 NOTE — Patient Instructions (Addendum)
Follow-up for bone density as scheduled.  Follow-up in 1 year for annual exam, sooner if any issues.

## 2018-12-09 NOTE — Progress Notes (Signed)
    Diana Reeves 1950-12-10 [id number]        68 y.o.  [id number] for annual gynecologic exam.  Without gynecologic complaints.  Past medical history,surgical history, problem list, medications, allergies, family history and social history were all reviewed and documented as reviewed in the EPIC chart.  ROS:  Performed with pertinent positives and negatives included in the history, assessment and plan.   Additional significant findings : None   Exam: Diana Reeves assistant Vitals:   12/09/18 1135  BP: 124/80  Weight: 196 lb (88.9 kg)  Height: 5' 5"  (1.651 m)   Body mass index is 32.62 kg/m.  General appearance:  Normal affect, orientation and appearance. Skin: Grossly normal HEENT: Without gross lesions.  No cervical or supraclavicular adenopathy. Thyroid normal.  Lungs:  Clear without wheezing, rales or rhonchi Cardiac: RR, without RMG Abdominal:  Soft, nontender, without masses, guarding, rebound, organomegaly or hernia Breasts:  Examined lying and sitting without masses, retractions, discharge or axillary adenopathy. Pelvic:  Ext, BUS, Vagina: With atrophic changes  Adnexa: Without masses or tenderness    Anus and perineum: Normal   Rectovaginal: Normal sphincter tone without palpated masses or tenderness.    Assessment/Plan:  68 y.o. G11P3003 female for annual gynecologic exam.  Status post TAH LSO posterior repair in the past  1. Postmenopausal/atrophic genital changes.  Using vaginal estradiol cream twice weekly through [facility] with good results.  Would like to continue.  Refill x1 year provided. 2. Osteoporosis.  DEXA 2018 T score -2.0 stable from prior DEXA.  Recommend follow-up DEXA now at 2-year interval and she will schedule in follow-up for this.  Had been on Actonel for approximately 5 years previously discontinued in 2012. 3. Mammography 10/2018.  Continue with annual mammography when due.  Breast exam normal today. 4. Colonoscopy 2020.  Repeat at their  recommended interval. 5. Pap smear 2015.  No Pap smear done today.  No history of abnormal Pap smears.  We both agree to stop screening per current screening guidelines based on age and hysterectomy history. 6. Health maintenance.  No routine lab work done as patient does this elsewhere.  Follow-up 1 year, sooner as needed.   Anastasio Auerbach MD, 11:56 AM 12/09/2018

## 2018-12-12 ENCOUNTER — Other Ambulatory Visit: Payer: Self-pay | Admitting: Internal Medicine

## 2018-12-12 DIAGNOSIS — E118 Type 2 diabetes mellitus with unspecified complications: Secondary | ICD-10-CM

## 2018-12-12 DIAGNOSIS — I10 Essential (primary) hypertension: Secondary | ICD-10-CM

## 2018-12-21 ENCOUNTER — Encounter: Payer: Self-pay | Admitting: Gynecology

## 2019-01-02 ENCOUNTER — Other Ambulatory Visit: Payer: Self-pay

## 2019-01-03 ENCOUNTER — Ambulatory Visit (INDEPENDENT_AMBULATORY_CARE_PROVIDER_SITE_OTHER): Payer: Medicare Other

## 2019-01-03 ENCOUNTER — Other Ambulatory Visit: Payer: Self-pay | Admitting: Gynecology

## 2019-01-03 DIAGNOSIS — Z780 Asymptomatic menopausal state: Secondary | ICD-10-CM | POA: Diagnosis not present

## 2019-01-03 DIAGNOSIS — M8589 Other specified disorders of bone density and structure, multiple sites: Secondary | ICD-10-CM | POA: Diagnosis not present

## 2019-01-03 DIAGNOSIS — M810 Age-related osteoporosis without current pathological fracture: Secondary | ICD-10-CM

## 2019-01-05 ENCOUNTER — Encounter: Payer: Self-pay | Admitting: Gynecology

## 2019-01-27 NOTE — Progress Notes (Addendum)
Subjective:   Diana Reeves is a 68 y.o. female who presents for Medicare Annual (Subsequent) preventive examination.  Review of Systems:   Cardiac Risk Factors include: advanced age (>5mn, >>58women);diabetes mellitus Sleep patterns: feels rested on waking, gets up 2 times nightly to void and sleeps 7 hours nightly.    Home Safety/Smoke Alarms: Feels safe in home. Smoke alarms in place.  Living environment; residence and Firearm Safety: 2-story house. Lives alone, no needs for DME, good support system Seat Belt Safety/Bike Helmet: Wears seat belt.      Objective:     Vitals: LMP 03/17/1991   There is no height or weight on file to calculate BMI.  Advanced Directives 01/30/2019 01/26/2018 01/08/2017 11/06/2015  Does Patient Have a Medical Advance Directive? Yes Yes Yes Yes  Type of AParamedicof [place]Living will [place]Living will [place] will [place]Living will  Does patient want to make changes to medical advance directive? - - No - Patient declined No - Patient declined  Copy of HCasas Adobesin Chart? No - copy requested No - copy requested No - copy requested Yes    Tobacco Social History   Tobacco Use  Smoking Status Former Smoker  Smokeless Tobacco Never Used     Counseling given: Not Answered  Past Medical History:  Diagnosis Date  . Allergic state 12/26/2016  . Basal cell carcinoma   . Benign neoplasm of colon   . Degenerative cervical disc   . Depression   . Diverticulosis of colon (without mention of hemorrhage)   . Esophageal reflux   . Glaucoma   . Osteoporosis, unspecified 12/2018   2007 T score -3.0, 2012 T score -1.9, 2015 T score -2.1 overall stable, 2018 T score -2.0, 2020 T score -1.8  . Other chronic nonalcoholic liver disease   . Sinusitis 12/26/2016  . Stricture and stenosis of esophagus    Past Surgical History:  Procedure  Laterality Date  . ABDOMINAL HYSTERECTOMY  1993   TAH-LSO-Post. repair-Burch  . COLONOSCOPY    . EYE SURGERY  10/18/2012   lt eye  . knee arthroscop Right 2018  . left arm surgery  7-09  . POLYPECTOMY     Family History  Problem Relation Age of Onset  . Cancer Father        colon  . Hypertension Father   . Colon cancer Father 831 . Heart disease Mother   . Cancer Maternal Grandmother        uterine  . Diabetes Paternal Grandfather   . Cancer Son        Testicular cancer  . Heart attack Son   . Esophageal cancer Neg Hx   . Stomach cancer Neg Hx   . Rectal cancer Neg Hx    Social History   Socioeconomic History  . Marital status: Divorced    Spouse name: Not on file  . Number of children: 3  . Years of education: Not on file  . Highest education level: Not on file  Occupational History  . Occupation: retired  SScientific laboratory technician . Financial resource strain: Not hard at all  . Food insecurity    Worry: Sometimes true    Inability: Sometimes true  . Transportation needs    Medical: No    Non-medical: No  Tobacco Use  . Smoking status: Former SResearch scientist (life sciences) . Smokeless tobacco: Never Used  Substance and Sexual Activity  .  Alcohol use: Yes    Alcohol/week: 2.0 standard drinks    Types: 2 Glasses of wine per week    Comment: occasional  . Drug use: No  . Sexual activity: Not Currently    Comment: 1st intercourse 68 yo-More than 5 partner,  Lifestyle  . Physical activity    Days per week: 4 days    Minutes per session: 40 min  . Stress: Only a little  Relationships  . Social connections    Talks on phone: More than three times a week    Gets together: More than three times a week    Attends religious service: Never    Active member of club or organization: Not on file    Attends meetings of clubs or organizations: Never    Relationship status: Divorced  Other Topics Concern  . Not on file  Social History Narrative  . Not on file    Outpatient Encounter  Medications as of 01/30/2019  Medication Sig  . Cholecalciferol 2000 units TABS Take 1 tablet (2,000 Units total) by mouth daily.  . DULoxetine (CYMBALTA) 60 MG capsule TAKE ONE CAPSULE BY MOUTH DAILY  . EPINEPHrine (EPIPEN 2-PAK) 0.3 mg/0.3 mL IJ SOAJ injection   . NEXIUM 40 MG capsule TAKE 1 CAPSULE (40 MG TOTAL) BY MOUTH DAILY BEFORE BREAKFAST.  . NONFORMULARY OR COMPOUNDED ITEM Estradiol vaginal cream 0.02% insert vaginally twice weeky  . olmesartan (BENICAR) 40 MG tablet TAKE ONE TABLET BY MOUTH DAILY  . timolol (TIMOPTIC) 0.5 % ophthalmic solution as directed.  . zolpidem (AMBIEN) 10 MG tablet Take 1 tablet (10 mg total) by mouth at bedtime as needed for sleep.  . [DISCONTINUED] atorvastatin (LIPITOR) 20 MG tablet Take 1 tablet (20 mg total) by mouth daily. (Patient not taking: Reported on 12/09/2018)  . [DISCONTINUED] Azelastine-Fluticasone 137-50 MCG/ACT SUSP One spray each nostril BID  . [DISCONTINUED] clonazePAM (KLONOPIN) 0.5 MG tablet Take 1 tablet (0.5 mg total) by mouth 2 (two) times daily as needed for anxiety. (Patient not taking: Reported on 01/30/2019)  . [DISCONTINUED] metronidazole (NORITATE) 1 % cream Apply topically 2 (two) times daily. (Patient not taking: Reported on 12/09/2018)   No facility-administered encounter medications on file as of 01/30/2019.     Activities of Daily Living In your present state of health, do you have any difficulty performing the following activities: 01/30/2019  Hearing? N  Vision? N  Difficulty concentrating or making decisions? N  Walking or climbing stairs? N  Dressing or bathing? N  Doing errands, shopping? N  Preparing Food and eating ? N  Using the Toilet? N  In the past six months, have you accidently leaked urine? N  Do you have problems with loss of bowel control? N  Managing your Medications? N  Managing your Finances? N  Some recent data might be hidden    Patient Care Team: Janith Lima, MD as PCP - General (Internal  Medicine)    Assessment:   This is a routine wellness examination for Diana Reeves. Physical assessment deferred to PCP.   Exercise Activities and Dietary recommendations Current Exercise Habits: The patient does not participate in regular exercise at present(physically active by maintaining 3 level house and yard), Exercise limited by: None identified Diet (meal preparation, eat out, water intake, caffeinated beverages, dairy products, fruits and vegetables): in general, a "healthy" diet  , well balanced   Reviewed heart healthy and diabetic diet. Encouraged patient to increase daily water and healthy fluid intake.  Goals    .  Patient Stated     I want to walk more and get back to water aerobics.    . Patient Stated     I want continue to be healthy and maintain my weight.        Fall Risk Fall Risk  01/30/2019 01/28/2018 01/26/2018 01/08/2017 01/06/2017  Falls in the past year? 0 0 0 No Yes  Number falls in past yr: 0 - - - 1  Injury with Fall? 0 - - - No  Follow up - - - - Falls evaluation completed   Depression Screen PHQ 2/9 Scores 01/30/2019 01/28/2018 01/26/2018 01/08/2017  PHQ - 2 Score 0 0 0 0  PHQ- 9 Score - - 2 -     Cognitive Function       Ad8 score reviewed for issues:  Issues making decisions: no  Less interest in hobbies / activities: no  Repeats questions, stories (family complaining): no  Trouble using ordinary gadgets (microwave, computer, phone):no  Forgets the month or year: no  Mismanaging finances: no  Remembering appts: no  Daily problems with thinking and/or memory: no Ad8 score is= 0  Immunization History  Administered Date(s) Administered  . Influenza, High Dose Seasonal PF 01/06/2017, 12/09/2017  . Influenza,inj,Quad PF,6+ Mos 11/05/2015, 12/09/2018  . Influenza-Unspecified 12/30/2011, 12/19/2012, 12/15/2014  . Pneumococcal Conjugate-13 08/29/2014  . Pneumococcal Polysaccharide-23 01/01/2011, 01/26/2018  . Tdap 10/08/2010  . Zoster  06/20/2012    Screening Tests Health Maintenance  Topic Date Due  . Hepatitis C Screening  Aug 13, 1950  . HEMOGLOBIN A1C  07/27/2018  . FOOT EXAM  01/27/2019  . OPHTHALMOLOGY EXAM  04/13/2019  . TETANUS/TDAP  10/07/2020  . MAMMOGRAM  10/16/2020  . COLONOSCOPY  04/13/2021  . INFLUENZA VACCINE  Completed  . DEXA SCAN  Completed  . PNA vac Low Risk Adult  Completed      Plan:    Reviewed health maintenance screenings with patient today and relevant education, vaccines, and/or referrals were provided.   I have personally reviewed and noted the following in the patient's chart:   . Medical and social history . Use of alcohol, tobacco or illicit drugs  . Current medications and supplements . Functional ability and status . Nutritional status . Physical activity . Advanced directives . List of other physicians . Vitals . Screenings to include cognitive, depression, and falls . Referrals and appointments  In addition, I have reviewed and discussed with patient certain preventive protocols, quality metrics, and best practice recommendations. A written personalized care plan for preventive services as well as general preventive health recommendations were provided to patient.     Michiel Cowboy, RN  01/30/2019    Medical screening examination/treatment/procedure(s) were performed by non-physician practitioner and as supervising physician I was immediately available for consultation/collaboration. I agree with above. Scarlette Calico, MD

## 2019-01-30 ENCOUNTER — Ambulatory Visit (INDEPENDENT_AMBULATORY_CARE_PROVIDER_SITE_OTHER): Payer: Medicare Other | Admitting: *Deleted

## 2019-01-30 ENCOUNTER — Encounter: Payer: Self-pay | Admitting: Internal Medicine

## 2019-01-30 ENCOUNTER — Other Ambulatory Visit (INDEPENDENT_AMBULATORY_CARE_PROVIDER_SITE_OTHER): Payer: Medicare Other

## 2019-01-30 ENCOUNTER — Other Ambulatory Visit: Payer: Self-pay

## 2019-01-30 ENCOUNTER — Ambulatory Visit (INDEPENDENT_AMBULATORY_CARE_PROVIDER_SITE_OTHER): Payer: Medicare Other | Admitting: Internal Medicine

## 2019-01-30 VITALS — BP 144/86 | HR 77 | Temp 98.2°F | Resp 16 | Ht 65.0 in | Wt 194.5 lb

## 2019-01-30 DIAGNOSIS — E785 Hyperlipidemia, unspecified: Secondary | ICD-10-CM | POA: Diagnosis not present

## 2019-01-30 DIAGNOSIS — E118 Type 2 diabetes mellitus with unspecified complications: Secondary | ICD-10-CM

## 2019-01-30 DIAGNOSIS — M839 Adult osteomalacia, unspecified: Secondary | ICD-10-CM

## 2019-01-30 DIAGNOSIS — Z0000 Encounter for general adult medical examination without abnormal findings: Secondary | ICD-10-CM

## 2019-01-30 DIAGNOSIS — Z1159 Encounter for screening for other viral diseases: Secondary | ICD-10-CM | POA: Insufficient documentation

## 2019-01-30 DIAGNOSIS — K7581 Nonalcoholic steatohepatitis (NASH): Secondary | ICD-10-CM | POA: Diagnosis not present

## 2019-01-30 DIAGNOSIS — I10 Essential (primary) hypertension: Secondary | ICD-10-CM | POA: Diagnosis not present

## 2019-01-30 LAB — LIPID PANEL
Cholesterol: 188 mg/dL (ref 0–200)
HDL: 45.3 mg/dL (ref >39.00)
LDL Cholesterol: 117 mg/dL — ABNORMAL HIGH (ref 0–99)
NonHDL: 143.05
Total CHOL/HDL Ratio: 4
Triglycerides: 132 mg/dL (ref 0.0–149.0)
VLDL: 26.4 mg/dL (ref 0.0–40.0)

## 2019-01-30 LAB — CBC WITH DIFFERENTIAL/PLATELET
Basophils Absolute: 0.1 10*3/uL (ref 0.0–0.1)
Basophils Relative: 1.1 % (ref 0.0–3.0)
Eosinophils Absolute: 0.2 10*3/uL (ref 0.0–0.7)
Eosinophils Relative: 4.4 % (ref 0.0–5.0)
HCT: 38.6 % (ref 36.0–46.0)
Hemoglobin: 13 g/dL (ref 12.0–15.0)
Lymphocytes Relative: 38.2 % (ref 12.0–46.0)
Lymphs Abs: 2.1 10*3/uL (ref 0.7–4.0)
MCHC: 33.8 g/dL (ref 30.0–36.0)
MCV: 84.3 fl (ref 78.0–100.0)
Monocytes Absolute: 0.4 10*3/uL (ref 0.1–1.0)
Monocytes Relative: 6.4 % (ref 3.0–12.0)
Neutro Abs: 2.8 10*3/uL (ref 1.4–7.7)
Neutrophils Relative %: 49.9 % (ref 43.0–77.0)
Platelets: 251 10*3/uL (ref 150.0–400.0)
RBC: 4.58 Mil/uL (ref 3.87–5.11)
RDW: 14.9 % (ref 11.5–15.5)
WBC: 5.6 10*3/uL (ref 4.0–10.5)

## 2019-01-30 LAB — BASIC METABOLIC PANEL
BUN: 14 mg/dL (ref 6–23)
CO2: 24 mEq/L (ref 19–32)
Calcium: 9.2 mg/dL (ref 8.4–10.5)
Chloride: 106 mEq/L (ref 96–112)
Creatinine, Ser: 0.68 mg/dL (ref 0.40–1.20)
GFR: 85.38 mL/min (ref >60.00)
Glucose, Bld: 131 mg/dL — ABNORMAL HIGH (ref 70–99)
Potassium: 3.9 mEq/L (ref 3.5–5.1)
Sodium: 141 mEq/L (ref 135–145)

## 2019-01-30 LAB — HEPATIC FUNCTION PANEL
ALT: 35 U/L (ref 0–35)
AST: 45 U/L — ABNORMAL HIGH (ref 0–37)
Albumin: 4.4 g/dL (ref 3.5–5.2)
Alkaline Phosphatase: 62 U/L (ref 39–117)
Bilirubin, Direct: 0.1 mg/dL (ref 0.0–0.3)
Total Bilirubin: 0.5 mg/dL (ref 0.2–1.2)
Total Protein: 7.1 g/dL (ref 6.0–8.3)

## 2019-01-30 LAB — MICROALBUMIN / CREATININE URINE RATIO
Creatinine,U: 217 mg/dL
Microalb Creat Ratio: 0.8 mg/g (ref 0.0–30.0)
Microalb, Ur: 1.8 mg/dL (ref 0.0–1.9)

## 2019-01-30 LAB — VITAMIN D 25 HYDROXY (VIT D DEFICIENCY, FRACTURES): VITD: 40.74 ng/mL (ref 30.00–100.00)

## 2019-01-30 LAB — TSH: TSH: 1.53 u[IU]/mL (ref 0.35–4.50)

## 2019-01-30 LAB — HEMOGLOBIN A1C: Hgb A1c MFr Bld: 6.8 % — ABNORMAL HIGH (ref 4.6–6.5)

## 2019-01-30 MED ORDER — PIOGLITAZONE HCL 15 MG PO TABS: 15.0000 mg | ORAL_TABLET | Freq: Every day | ORAL | 1 refills | Status: DC

## 2019-01-30 MED ORDER — ATORVASTATIN CALCIUM 20 MG PO TABS: 20.0000 mg | ORAL_TABLET | Freq: Every day | ORAL | 1 refills | Status: DC

## 2019-01-30 NOTE — Patient Instructions (Addendum)
Continue doing brain stimulating activities (puzzles, reading, adult coloring books, staying active) to keep memory sharp.   Continue to eat heart healthy diet (full of fruits, vegetables, whole grains, lean protein, water--limit salt, fat, and sugar intake) and increase physical activity as tolerated.   Diana Reeves , Thank you for taking time to come for your Medicare Wellness Visit. I appreciate your ongoing commitment to your health goals. Please review the following plan we discussed and let me know if I can assist you in the future.   These are the goals we discussed: Goals    . Patient Stated     I want to walk more and get back to water aerobics.    . Patient Stated     I want continue to be healthy and maintain my weight.        This is a list of the screening recommended for you and due dates:  Health Maintenance  Topic Date Due  .  Hepatitis C: One time screening is recommended by Center for Disease Control  (CDC) for  adults born from [birth year] through 1967.   21-Dec-1950  . Hemoglobin A1C  07/27/2018  . Complete foot exam   01/27/2019  . Eye exam for diabetics  04/13/2019  . Tetanus Vaccine  10/07/2020  . Mammogram  10/16/2020  . Colon Cancer Screening  04/13/2021  . Flu Shot  Completed  . DEXA scan (bone density measurement)  Completed  . Pneumonia vaccines  Completed     Preventive Care 25 Years and Older, Female Preventive care refers to lifestyle choices and visits with your health care provider that can promote health and wellness. This includes:  A yearly physical exam. This is also called an annual well check.  Regular dental and eye exams.  Immunizations.  Screening for certain conditions.  Healthy lifestyle choices, such as diet and exercise. What can I expect for my preventive care visit? Physical exam Your health care provider will check:  Height and weight. These may be used to calculate body mass index (BMI), which is a measurement that tells if you  are at a healthy weight.  Heart rate and blood pressure.  Your skin for abnormal spots. Counseling Your health care provider may ask you questions about:  Alcohol, tobacco, and drug use.  Emotional well-being.  Home and relationship well-being.  Sexual activity.  Eating habits.  History of falls.  Memory and ability to understand (cognition).  Work and work Statistician.  Pregnancy and menstrual history. What immunizations do I need?  Influenza (flu) vaccine  This is recommended every year. Tetanus, diphtheria, and pertussis (Tdap) vaccine  You may need a Td booster every 10 years. Varicella (chickenpox) vaccine  You may need this vaccine if you have not already been vaccinated. Zoster (shingles) vaccine  You may need this after age 24. Pneumococcal conjugate (PCV13) vaccine  One dose is recommended after age 61. Pneumococcal polysaccharide (PPSV23) vaccine  One dose is recommended after age 61. Measles, mumps, and rubella (MMR) vaccine  You may need at least one dose of MMR if you were born in 1957 or later. You may also need a second dose. Meningococcal conjugate (MenACWY) vaccine  You may need this if you have certain conditions. Hepatitis A vaccine  You may need this if you have certain conditions or if you travel or work in places where you may be exposed to hepatitis A. Hepatitis B vaccine  You may need this if you have certain conditions or  if you travel or work in places where you may be exposed to hepatitis B. Haemophilus influenzae type b (Hib) vaccine  You may need this if you have certain conditions. You may receive vaccines as individual doses or as more than one vaccine together in one shot (combination vaccines). Talk with your health care provider about the risks and benefits of combination vaccines. What tests do I need? Blood tests  Lipid and cholesterol levels. These may be checked every 5 years, or more frequently depending on your  overall health.  Hepatitis C test.  Hepatitis B test. Screening  Lung cancer screening. You may have this screening every year starting at age 4 if you have a 30-pack-year history of smoking and currently smoke or have quit within the past 15 years.  Colorectal cancer screening. All adults should have this screening starting at age 6 and continuing until age 21. Your health care provider may recommend screening at age 15 if you are at increased risk. You will have tests every 1-10 years, depending on your results and the type of screening test.  Diabetes screening. This is done by checking your blood sugar (glucose) after you have not eaten for a while (fasting). You may have this done every 1-3 years.  Mammogram. This may be done every 1-2 years. Talk with your health care provider about how often you should have regular mammograms.  BRCA-related cancer screening. This may be done if you have a family history of breast, ovarian, tubal, or peritoneal cancers. Other tests  Sexually transmitted disease (STD) testing.  Bone density scan. This is done to screen for osteoporosis. You may have this done starting at age 51. Follow these instructions at home: Eating and drinking  Eat a diet that includes fresh fruits and vegetables, whole grains, lean protein, and low-fat dairy products. Limit your intake of foods with high amounts of sugar, saturated fats, and salt.  Take vitamin and mineral supplements as recommended by your health care provider.  Do not drink alcohol if your health care provider tells you not to drink.  If you drink alcohol: ? Limit how much you have to 0-1 drink a day. ? Be aware of how much alcohol is in your drink. In the [place] one drink equals one 12 oz bottle of beer (355 mL), one 5 oz glass of [name] (148 mL), or one 1 oz glass of hard liquor (44 mL). Lifestyle  Take daily care of your teeth and gums.  Stay active. Exercise for at least 30 minutes on 5 or more  days each week.  Do not use any products that contain nicotine or tobacco, such as cigarettes, e-cigarettes, and chewing tobacco. If you need help quitting, ask your health care provider.  If you are sexually active, practice safe sex. Use a condom or other form of protection in order to prevent STIs (sexually transmitted infections).  Talk with your health care provider about taking a low-dose aspirin or statin. What's next?  Go to your health care provider once a year for a well check visit.  Ask your health care provider how often you should have your eyes and teeth checked.  Stay up to date on all vaccines. This information is not intended to replace advice given to you by your health care provider. Make sure you discuss any questions you have with your health care provider. Document Released: 03/29/2015 Document Revised: 02/24/2018 Document Reviewed: 02/24/2018 Elsevier Patient Education  2020 [employer].

## 2019-01-30 NOTE — Patient Instructions (Signed)
Health Maintenance, Female Adopting a healthy lifestyle and getting preventive care are important in promoting health and wellness. Ask your health care provider about:  The right schedule for you to have regular tests and exams.  Things you can do on your own to prevent diseases and keep yourself healthy. What should I know about diet, weight, and exercise? Eat a healthy diet   Eat a diet that includes plenty of vegetables, fruits, low-fat dairy products, and lean protein.  Do not eat a lot of foods that are high in solid fats, added sugars, or sodium. Maintain a healthy weight Body mass index (BMI) is used to identify weight problems. It estimates body fat based on height and weight. Your health care provider can help determine your BMI and help you achieve or maintain a healthy weight. Get regular exercise Get regular exercise. This is one of the most important things you can do for your health. Most adults should:  Exercise for at least 150 minutes each week. The exercise should increase your heart rate and make you sweat (moderate-intensity exercise).  Do strengthening exercises at least twice a week. This is in addition to the moderate-intensity exercise.  Spend less time sitting. Even light physical activity can be beneficial. Watch cholesterol and blood lipids Have your blood tested for lipids and cholesterol at 70 years of age, then have this test every 5 years. Have your cholesterol levels checked more often if:  Your lipid or cholesterol levels are high.  You are older than 70 years of age.  You are at high risk for heart disease. What should I know about cancer screening? Depending on your health history and family history, you may need to have cancer screening at various ages. This may include screening for:  Breast cancer.  Cervical cancer.  Colorectal cancer.  Skin cancer.  Lung cancer. What should I know about heart disease, diabetes, and high blood  pressure? Blood pressure and heart disease  High blood pressure causes heart disease and increases the risk of stroke. This is more likely to develop in people who have high blood pressure readings, are of African descent, or are overweight.  Have your blood pressure checked: ? Every 3-5 years if you are 18-39 years of age. ? Every year if you are 40 years old or older. Diabetes Have regular diabetes screenings. This checks your fasting blood sugar level. Have the screening done:  Once every three years after age 40 if you are at a normal weight and have a low risk for diabetes.  More often and at a younger age if you are overweight or have a high risk for diabetes. What should I know about preventing infection? Hepatitis B If you have a higher risk for hepatitis B, you should be screened for this virus. Talk with your health care provider to find out if you are at risk for hepatitis B infection. Hepatitis C Testing is recommended for:  Everyone born from 1945 through 1965.  Anyone with known risk factors for hepatitis C. Sexually transmitted infections (STIs)  Get screened for STIs, including gonorrhea and chlamydia, if: ? You are sexually active and are younger than 70 years of age. ? You are older than 70 years of age and your health care provider tells you that you are at risk for this type of infection. ? Your sexual activity has changed since you were last screened, and you are at increased risk for chlamydia or gonorrhea. Ask your health care provider if   you are at risk.  Ask your health care provider about whether you are at high risk for HIV. Your health care provider may recommend a prescription medicine to help prevent HIV infection. If you choose to take medicine to prevent HIV, you should first get tested for HIV. You should then be tested every 3 months for as long as you are taking the medicine. Pregnancy  If you are about to stop having your period (premenopausal) and  you may become pregnant, seek counseling before you get pregnant.  Take 400 to 800 micrograms (mcg) of folic acid every day if you become pregnant.  Ask for birth control (contraception) if you want to prevent pregnancy. Osteoporosis and menopause Osteoporosis is a disease in which the bones lose minerals and strength with aging. This can result in bone fractures. If you are 65 years old or older, or if you are at risk for osteoporosis and fractures, ask your health care provider if you should:  Be screened for bone loss.  Take a calcium or vitamin D supplement to lower your risk of fractures.  Be given hormone replacement therapy (HRT) to treat symptoms of menopause. Follow these instructions at home: Lifestyle  Do not use any products that contain nicotine or tobacco, such as cigarettes, e-cigarettes, and chewing tobacco. If you need help quitting, ask your health care provider.  Do not use street drugs.  Do not share needles.  Ask your health care provider for help if you need support or information about quitting drugs. Alcohol use  Do not drink alcohol if: ? Your health care provider tells you not to drink. ? You are pregnant, may be pregnant, or are planning to become pregnant.  If you drink alcohol: ? Limit how much you use to 0-1 drink a day. ? Limit intake if you are breastfeeding.  Be aware of how much alcohol is in your drink. In the [place] one drink equals one 12 oz bottle of beer (355 mL), one 5 oz glass of wine (148 mL), or one 1 oz glass of hard liquor (44 mL). General instructions  Schedule regular health, dental, and eye exams.  Stay current with your vaccines.  Tell your health care provider if: ? You often feel depressed. ? You have ever been abused or do not feel safe at home. Summary  Adopting a healthy lifestyle and getting preventive care are important in promoting health and wellness.  Follow your health care provider's instructions about healthy  diet, exercising, and getting tested or screened for diseases.  Follow your health care provider's instructions on monitoring your cholesterol and blood pressure. This information is not intended to replace advice given to you by your health care provider. Make sure you discuss any questions you have with your health care provider. Document Released: 09/15/2010 Document Revised: 02/23/2018 Document Reviewed: 02/23/2018 Elsevier Patient Education  2020 [employer]  

## 2019-01-30 NOTE — Progress Notes (Addendum)
Subjective:  Patient ID: Diana Reeves, female    DOB: 01/19/1951  Age: 68 y.o. MRN: [id number]  CC: Annual Exam, Hyperlipidemia, Hypertension, and Diabetes  This visit occurred during the SARS-CoV-2 public health emergency.  Safety protocols were in place, including screening questions prior to the visit, additional usage of staff PPE, and extensive cleaning of exam room while observing appropriate contact time as indicated for disinfecting solutions.    HPI Diana Reeves presents for a CPX.  She feels well today and offers no complaints.  She has been hiking recently and denies any episodes of DOE, CP, palpitations, edema, or fatigue.  She does not monitor her blood pressure or her blood sugars.  Outpatient Medications Prior to Visit  Medication Sig Dispense Refill  . Cholecalciferol 2000 units TABS Take 1 tablet (2,000 Units total) by mouth daily. 90 tablet 3  . DULoxetine (CYMBALTA) 60 MG capsule TAKE ONE CAPSULE BY MOUTH DAILY 90 capsule 1  . EPINEPHrine (EPIPEN 2-PAK) 0.3 mg/0.3 mL IJ SOAJ injection     . NEXIUM 40 MG capsule TAKE 1 CAPSULE (40 MG TOTAL) BY MOUTH DAILY BEFORE BREAKFAST. 30 capsule 10  . NONFORMULARY OR COMPOUNDED ITEM Estradiol vaginal cream 0.02% insert vaginally twice weeky 90 each 4  . olmesartan (BENICAR) 40 MG tablet TAKE ONE TABLET BY MOUTH DAILY 90 tablet 0  . timolol (TIMOPTIC) 0.5 % ophthalmic solution as directed.    . zolpidem (AMBIEN) 10 MG tablet Take 1 tablet (10 mg total) by mouth at bedtime as needed for sleep. 90 tablet 0  . Azelastine-Fluticasone 137-50 MCG/ACT SUSP One spray each nostril BID 1 Bottle 11  . atorvastatin (LIPITOR) 20 MG tablet Take 1 tablet (20 mg total) by mouth daily. (Patient not taking: Reported on 12/09/2018) 90 tablet 1  . clonazePAM (KLONOPIN) 0.5 MG tablet Take 1 tablet (0.5 mg total) by mouth 2 (two) times daily as needed for anxiety. (Patient not taking: Reported on 01/30/2019) 180 tablet 0  . metronidazole (NORITATE) 1 %  cream Apply topically 2 (two) times daily. (Patient not taking: Reported on 12/09/2018) 60 g 0   No facility-administered medications prior to visit.     ROS Review of Systems  Constitutional: Negative for diaphoresis and fatigue.  HENT: Negative.   Respiratory: Negative for cough, chest tightness, shortness of breath and wheezing.   Cardiovascular: Negative for chest pain, palpitations and leg swelling.  Gastrointestinal: Negative for abdominal pain, constipation, diarrhea, nausea and vomiting.  Endocrine: Negative.  Negative for polydipsia, polyphagia and polyuria.  Genitourinary: Negative.  Negative for difficulty urinating.  Musculoskeletal: Negative.  Negative for arthralgias, joint swelling and myalgias.  Skin: Negative.  Negative for color change and pallor.  Neurological: Negative.  Negative for dizziness, weakness and light-headedness.  Hematological: Negative for adenopathy. Does not bruise/bleed easily.  Psychiatric/Behavioral: Negative.     Objective:  BP (!) 144/86 (BP Location: Left Arm, Patient Position: Sitting, Cuff Size: Normal)   Pulse 77   Temp 98.2 F (36.8 C) (Oral)   Resp 16   Ht 5' 5"  (1.651 m)   Wt 194 lb 8 oz (88.2 kg)   LMP 03/17/1991   SpO2 96%   BMI 32.37 kg/m   BP Readings from Last 3 Encounters:  01/30/19 (!) 144/86  12/09/18 124/80  04/13/18 132/61    Wt Readings from Last 3 Encounters:  01/30/19 194 lb 8 oz (88.2 kg)  12/09/18 196 lb (88.9 kg)  04/13/18 250 lb (113.4 kg)    Physical  Exam Vitals signs reviewed.  Constitutional:      Appearance: Normal appearance. She is obese.  HENT:     Nose: Nose normal.     Mouth/Throat:     Mouth: Mucous membranes are moist.  Eyes:     General: No scleral icterus.    Conjunctiva/sclera: Conjunctivae normal.  Neck:     Musculoskeletal: Neck supple.  Cardiovascular:     Rate and Rhythm: Normal rate and regular rhythm.     Heart sounds: No murmur.  Pulmonary:     Effort: Pulmonary effort  is normal.     Breath sounds: No stridor. No wheezing, rhonchi or rales.  Abdominal:     General: Abdomen is protuberant. Bowel sounds are normal.     Palpations: There is no hepatomegaly or splenomegaly.     Tenderness: There is no abdominal tenderness.  Lymphadenopathy:     Cervical: No cervical adenopathy.  Neurological:     Mental Status: She is alert.     Lab Results  Component Value Date   WBC 5.6 01/30/2019   HGB 13.0 01/30/2019   HCT 38.6 01/30/2019   PLT 251.0 01/30/2019   GLUCOSE 131 (H) 01/30/2019   CHOL 188 01/30/2019   TRIG 132.0 01/30/2019   HDL 45.30 01/30/2019   LDLCALC 117 (H) 01/30/2019   ALT 35 01/30/2019   AST 45 (H) 01/30/2019   NA 141 01/30/2019   K 3.9 01/30/2019   CL 106 01/30/2019   CREATININE 0.68 01/30/2019   BUN 14 01/30/2019   CO2 24 01/30/2019   TSH 1.53 01/30/2019   HGBA1C 6.8 (H) 01/30/2019   MICROALBUR 1.8 01/30/2019    US Abdomen Limited Ruq  Result Date: 03/07/2018 CLINICAL DATA:  Elevated LFTs. EXAM: ULTRASOUND ABDOMEN LIMITED RIGHT UPPER QUADRANT COMPARISON:  CT 12/23/2015. FINDINGS: Gallbladder: No gallstones or wall thickening visualized. No sonographic Murphy sign noted by sonographer. Common bile duct: Diameter: 2.9 mm Liver: Diffuse increased echogenicity of the liver noted consistent fatty infiltration and/or hepatocellular disease. Portal vein is patent on color Doppler imaging with normal direction of blood flow towards the liver. IMPRESSION: 1. Diffuse increased echogenicity liver noted consistent fatty infiltration and/or hepatocellular disease. 2.  No gallstones or biliary distention. Electronically Signed   By: Marcello Moores  Register   On: 03/07/2018 12:45    Assessment & Plan:   Diana Reeves was seen today for annual exam, hyperlipidemia, hypertension and diabetes.  Diagnoses and all orders for this visit:  Need for hepatitis C screening test -     Hepatitis C antibody; Future  Type II diabetes mellitus with manifestations ([place])-  Her A1c is at 6.8% and she has NASH.  I recommended that she add pioglitazone to her current regimen. -     Hemoglobin A1c; Future -     Microalbumin / creatinine urine ratio; Future -     HM Diabetes Foot Exam -     pioglitazone (ACTOS) 15 MG tablet; Take 1 tablet (15 mg total) by mouth daily.  Essential hypertension, benign- Her blood pressure is adequately well controlled.  Electrolytes and renal function are normal. -     CBC with Differential; Future -     Basic metabolic panel; Future  Vitamin D deficient osteomalacia -     Vitamin D 25 hydroxy; Future  Nonalcoholic steatohepatitis ([facility]) -     Hepatic function panel; Future -     Hepatitis C antibody; Future -     pioglitazone (ACTOS) 15 MG tablet; Take 1 tablet (15  mg total) by mouth daily.  Hyperlipidemia with target LDL less than 100- She has not been taking the statin and has an elevated ASCVD risk score.  I have asked her to restart the statin. -     Lipid panel; Future -     TSH; Future -     atorvastatin (LIPITOR) 20 MG tablet; Take 1 tablet (20 mg total) by mouth daily.  Routine general medical examination at a health care facility- Exam completed, labs reviewed, vaccines reviewed and updated, cancer screenings are up-to-date, patient education was given.   I have discontinued Tiffiney Sparrow. Levins's Azelastine-Fluticasone, clonazePAM, and metronidazole. I am also having her start on pioglitazone. Additionally, I am having her maintain her NexIUM, timolol, EPINEPHrine, Cholecalciferol, zolpidem, DULoxetine, NONFORMULARY OR COMPOUNDED ITEM, olmesartan, and atorvastatin.  Meds ordered this encounter  Medications  . atorvastatin (LIPITOR) 20 MG tablet    Sig: Take 1 tablet (20 mg total) by mouth daily.    Dispense:  90 tablet    Refill:  1  . pioglitazone (ACTOS) 15 MG tablet    Sig: Take 1 tablet (15 mg total) by mouth daily.    Dispense:  90 tablet    Refill:  1     Follow-up: Return in about 6 months (around  07/30/2019).  Scarlette Calico, MD

## 2019-01-31 LAB — HEPATITIS C ANTIBODY
Hepatitis C Ab: NONREACTIVE
SIGNAL TO CUT-OFF: 0.01 (ref <1.00)

## 2019-02-15 ENCOUNTER — Other Ambulatory Visit: Payer: Self-pay | Admitting: Internal Medicine

## 2019-02-15 DIAGNOSIS — F418 Other specified anxiety disorders: Secondary | ICD-10-CM

## 2019-03-11 ENCOUNTER — Other Ambulatory Visit: Payer: Self-pay | Admitting: Internal Medicine

## 2019-03-11 DIAGNOSIS — E118 Type 2 diabetes mellitus with unspecified complications: Secondary | ICD-10-CM

## 2019-03-11 DIAGNOSIS — I10 Essential (primary) hypertension: Secondary | ICD-10-CM

## 2019-03-27 DIAGNOSIS — J3089 Other allergic rhinitis: Secondary | ICD-10-CM | POA: Diagnosis not present

## 2019-04-06 DIAGNOSIS — J3089 Other allergic rhinitis: Secondary | ICD-10-CM | POA: Diagnosis not present

## 2019-04-12 DIAGNOSIS — J3089 Other allergic rhinitis: Secondary | ICD-10-CM | POA: Diagnosis not present

## 2019-04-14 DIAGNOSIS — J3089 Other allergic rhinitis: Secondary | ICD-10-CM | POA: Diagnosis not present

## 2019-04-18 DIAGNOSIS — J3089 Other allergic rhinitis: Secondary | ICD-10-CM | POA: Diagnosis not present

## 2019-04-23 ENCOUNTER — Ambulatory Visit: Payer: Medicare PPO | Attending: Internal Medicine

## 2019-04-23 DIAGNOSIS — Z23 Encounter for immunization: Secondary | ICD-10-CM | POA: Insufficient documentation

## 2019-04-23 NOTE — Progress Notes (Signed)
   Covid-19 Vaccination Clinic  Name:  Diana Reeves    MRN: [id number] DOB: 11-08-1950  04/23/2019  Diana Reeves was observed post Covid-19 immunization for 15 minutes without incidence. She was provided with Vaccine Information Sheet and instruction to access the V-Safe system.   Diana Reeves was instructed to call 911 with any severe reactions post vaccine: Marland Kitchen Difficulty breathing  . Swelling of your face and throat  . A fast heartbeat  . A bad rash all over your body  . Dizziness and weakness    Immunizations Administered    Name Date Dose VIS Date Route   Pfizer COVID-19 Vaccine 04/23/2019  5:11 PM 0.3 mL 02/24/2019 Intramuscular   Manufacturer: [place]   Lot: [id number]   [place]: [id number]

## 2019-04-24 DIAGNOSIS — J3089 Other allergic rhinitis: Secondary | ICD-10-CM | POA: Diagnosis not present

## 2019-05-11 ENCOUNTER — Ambulatory Visit: Payer: Medicare Other

## 2019-05-11 DIAGNOSIS — J3089 Other allergic rhinitis: Secondary | ICD-10-CM | POA: Diagnosis not present

## 2019-05-18 ENCOUNTER — Ambulatory Visit: Payer: Medicare PPO | Attending: Internal Medicine

## 2019-05-18 DIAGNOSIS — Z23 Encounter for immunization: Secondary | ICD-10-CM

## 2019-05-18 NOTE — Progress Notes (Signed)
   Covid-19 Vaccination Clinic  Name:  Diana Reeves    MRN: [id number] DOB: [birth year]  05/18/2019  Diana Reeves was observed post Covid-19 immunization for 15 minutes without incident. She was provided with Vaccine Information Sheet and instruction to access the V-Safe system.   Diana Reeves was instructed to call 911 with any severe reactions post vaccine: Marland Kitchen Difficulty breathing  . Swelling of face and throat  . A fast heartbeat  . A bad rash all over body  . Dizziness and weakness   Immunizations Administered    Name Date Dose VIS Date Route   Pfizer COVID-19 Vaccine 05/18/2019  1:55 PM 0.3 mL 02/24/2019 Intramuscular   Manufacturer: [place]   Lot: [id number]   [place]: [id number]

## 2019-05-22 DIAGNOSIS — J3089 Other allergic rhinitis: Secondary | ICD-10-CM | POA: Diagnosis not present

## 2019-05-23 DIAGNOSIS — E119 Type 2 diabetes mellitus without complications: Secondary | ICD-10-CM | POA: Diagnosis not present

## 2019-05-23 DIAGNOSIS — H524 Presbyopia: Secondary | ICD-10-CM | POA: Diagnosis not present

## 2019-05-23 DIAGNOSIS — H401423 Capsular glaucoma with pseudoexfoliation of lens, left eye, severe stage: Secondary | ICD-10-CM | POA: Diagnosis not present

## 2019-05-23 DIAGNOSIS — H401412 Capsular glaucoma with pseudoexfoliation of lens, right eye, moderate stage: Secondary | ICD-10-CM | POA: Diagnosis not present

## 2019-05-23 LAB — HM DIABETES EYE EXAM

## 2019-05-31 ENCOUNTER — Encounter: Payer: Self-pay | Admitting: Internal Medicine

## 2019-06-05 DIAGNOSIS — J3089 Other allergic rhinitis: Secondary | ICD-10-CM | POA: Diagnosis not present

## 2019-06-13 DIAGNOSIS — J3089 Other allergic rhinitis: Secondary | ICD-10-CM | POA: Diagnosis not present

## 2019-06-20 DIAGNOSIS — J3089 Other allergic rhinitis: Secondary | ICD-10-CM | POA: Diagnosis not present

## 2019-06-23 ENCOUNTER — Other Ambulatory Visit: Payer: Self-pay | Admitting: Internal Medicine

## 2019-06-23 DIAGNOSIS — E118 Type 2 diabetes mellitus with unspecified complications: Secondary | ICD-10-CM

## 2019-06-23 DIAGNOSIS — I10 Essential (primary) hypertension: Secondary | ICD-10-CM

## 2019-07-03 DIAGNOSIS — J3089 Other allergic rhinitis: Secondary | ICD-10-CM | POA: Diagnosis not present

## 2019-07-06 DIAGNOSIS — H26492 Other secondary cataract, left eye: Secondary | ICD-10-CM | POA: Diagnosis not present

## 2019-07-20 DIAGNOSIS — J3089 Other allergic rhinitis: Secondary | ICD-10-CM | POA: Diagnosis not present

## 2019-07-24 DIAGNOSIS — J3089 Other allergic rhinitis: Secondary | ICD-10-CM | POA: Diagnosis not present

## 2019-08-01 DIAGNOSIS — J3089 Other allergic rhinitis: Secondary | ICD-10-CM | POA: Diagnosis not present

## 2019-08-10 DIAGNOSIS — J3089 Other allergic rhinitis: Secondary | ICD-10-CM | POA: Diagnosis not present

## 2019-08-15 ENCOUNTER — Other Ambulatory Visit: Payer: Self-pay | Admitting: Internal Medicine

## 2019-08-15 DIAGNOSIS — K7581 Nonalcoholic steatohepatitis (NASH): Secondary | ICD-10-CM

## 2019-08-15 DIAGNOSIS — E118 Type 2 diabetes mellitus with unspecified complications: Secondary | ICD-10-CM

## 2019-08-21 DIAGNOSIS — R05 Cough: Secondary | ICD-10-CM | POA: Diagnosis not present

## 2019-08-21 DIAGNOSIS — J3089 Other allergic rhinitis: Secondary | ICD-10-CM | POA: Diagnosis not present

## 2019-08-21 DIAGNOSIS — K219 Gastro-esophageal reflux disease without esophagitis: Secondary | ICD-10-CM | POA: Diagnosis not present

## 2019-08-31 DIAGNOSIS — J3089 Other allergic rhinitis: Secondary | ICD-10-CM | POA: Diagnosis not present

## 2019-09-06 ENCOUNTER — Telehealth: Payer: Self-pay | Admitting: Internal Medicine

## 2019-09-06 NOTE — Telephone Encounter (Signed)
New message:   1.Medication Requested: DULoxetine (CYMBALTA) 60 MG capsule 2. Pharmacy (Name, [facility], [place]): [facility] [id number] [place], [place] 3. On Med List: yes  4. Last Visit with PCP: 01/30/19  5. Next visit date with PCP: 01/31/20   Agent: Please be advised that RX refills may take up to 3 business days. We ask that you follow-up with your pharmacy.

## 2019-09-07 NOTE — Telephone Encounter (Signed)
Pt is requesting a refill of the cymbalta 60 mg capsules to [facility].   Pt was to follow up in May. Pt has an appointment schedule for November 2021.   LVM for pt to call.  Please request patient to schedule an appointment.

## 2019-09-08 ENCOUNTER — Telehealth: Payer: Self-pay | Admitting: Internal Medicine

## 2019-09-08 NOTE — Telephone Encounter (Signed)
New message:   LVM for pt to call and schedule an appt for her medication refills. Please advise.

## 2019-09-11 ENCOUNTER — Other Ambulatory Visit: Payer: Self-pay | Admitting: Internal Medicine

## 2019-09-11 DIAGNOSIS — F418 Other specified anxiety disorders: Secondary | ICD-10-CM

## 2019-09-11 DIAGNOSIS — J3089 Other allergic rhinitis: Secondary | ICD-10-CM | POA: Diagnosis not present

## 2019-09-21 ENCOUNTER — Other Ambulatory Visit: Payer: Self-pay | Admitting: Internal Medicine

## 2019-09-21 DIAGNOSIS — E118 Type 2 diabetes mellitus with unspecified complications: Secondary | ICD-10-CM

## 2019-09-21 DIAGNOSIS — I10 Essential (primary) hypertension: Secondary | ICD-10-CM

## 2019-09-22 DIAGNOSIS — J3089 Other allergic rhinitis: Secondary | ICD-10-CM | POA: Diagnosis not present

## 2019-09-29 DIAGNOSIS — J301 Allergic rhinitis due to pollen: Secondary | ICD-10-CM | POA: Diagnosis not present

## 2019-09-29 DIAGNOSIS — J3089 Other allergic rhinitis: Secondary | ICD-10-CM | POA: Diagnosis not present

## 2019-10-16 DIAGNOSIS — J301 Allergic rhinitis due to pollen: Secondary | ICD-10-CM | POA: Diagnosis not present

## 2019-10-16 DIAGNOSIS — J3081 Allergic rhinitis due to animal (cat) (dog) hair and dander: Secondary | ICD-10-CM | POA: Diagnosis not present

## 2019-10-16 DIAGNOSIS — J3089 Other allergic rhinitis: Secondary | ICD-10-CM | POA: Diagnosis not present

## 2019-10-23 DIAGNOSIS — L820 Inflamed seborrheic keratosis: Secondary | ICD-10-CM | POA: Diagnosis not present

## 2019-10-23 DIAGNOSIS — L918 Other hypertrophic disorders of the skin: Secondary | ICD-10-CM | POA: Diagnosis not present

## 2019-10-23 DIAGNOSIS — Z85828 Personal history of other malignant neoplasm of skin: Secondary | ICD-10-CM | POA: Diagnosis not present

## 2019-10-23 DIAGNOSIS — D2272 Melanocytic nevi of left lower limb, including hip: Secondary | ICD-10-CM | POA: Diagnosis not present

## 2019-10-23 DIAGNOSIS — L821 Other seborrheic keratosis: Secondary | ICD-10-CM | POA: Diagnosis not present

## 2019-10-23 DIAGNOSIS — L710 Perioral dermatitis: Secondary | ICD-10-CM | POA: Diagnosis not present

## 2019-10-23 DIAGNOSIS — D225 Melanocytic nevi of trunk: Secondary | ICD-10-CM | POA: Diagnosis not present

## 2019-10-23 DIAGNOSIS — D2222 Melanocytic nevi of left ear and external auricular canal: Secondary | ICD-10-CM | POA: Diagnosis not present

## 2019-10-23 DIAGNOSIS — D239 Other benign neoplasm of skin, unspecified: Secondary | ICD-10-CM | POA: Diagnosis not present

## 2019-10-25 DIAGNOSIS — J301 Allergic rhinitis due to pollen: Secondary | ICD-10-CM | POA: Diagnosis not present

## 2019-10-25 DIAGNOSIS — J3089 Other allergic rhinitis: Secondary | ICD-10-CM | POA: Diagnosis not present

## 2019-10-27 DIAGNOSIS — Z1231 Encounter for screening mammogram for malignant neoplasm of breast: Secondary | ICD-10-CM | POA: Diagnosis not present

## 2019-10-27 LAB — HM MAMMOGRAPHY

## 2019-11-01 DIAGNOSIS — R928 Other abnormal and inconclusive findings on diagnostic imaging of breast: Secondary | ICD-10-CM | POA: Diagnosis not present

## 2019-11-03 ENCOUNTER — Encounter: Payer: Self-pay | Admitting: Internal Medicine

## 2019-11-03 NOTE — Progress Notes (Signed)
The mammogram wsa incomplete - additional imaging evaluation needed.   The developing asymmetry in the right breast is indeterminate. A diagnostic mammogram is recommended.

## 2019-11-07 DIAGNOSIS — J3089 Other allergic rhinitis: Secondary | ICD-10-CM | POA: Diagnosis not present

## 2019-11-08 DIAGNOSIS — J3089 Other allergic rhinitis: Secondary | ICD-10-CM | POA: Diagnosis not present

## 2019-11-09 DIAGNOSIS — M25562 Pain in left knee: Secondary | ICD-10-CM | POA: Diagnosis not present

## 2019-11-14 DIAGNOSIS — H401412 Capsular glaucoma with pseudoexfoliation of lens, right eye, moderate stage: Secondary | ICD-10-CM | POA: Diagnosis not present

## 2019-11-14 DIAGNOSIS — J3089 Other allergic rhinitis: Secondary | ICD-10-CM | POA: Diagnosis not present

## 2019-11-14 DIAGNOSIS — H401423 Capsular glaucoma with pseudoexfoliation of lens, left eye, severe stage: Secondary | ICD-10-CM | POA: Diagnosis not present

## 2019-11-21 DIAGNOSIS — J3089 Other allergic rhinitis: Secondary | ICD-10-CM | POA: Diagnosis not present

## 2019-11-24 DIAGNOSIS — J3089 Other allergic rhinitis: Secondary | ICD-10-CM | POA: Diagnosis not present

## 2019-12-05 DIAGNOSIS — J3089 Other allergic rhinitis: Secondary | ICD-10-CM | POA: Diagnosis not present

## 2019-12-11 ENCOUNTER — Encounter: Payer: Medicare Other | Admitting: Obstetrics & Gynecology

## 2019-12-11 ENCOUNTER — Encounter: Payer: Medicare Other | Admitting: Gynecology

## 2019-12-18 DIAGNOSIS — J3081 Allergic rhinitis due to animal (cat) (dog) hair and dander: Secondary | ICD-10-CM | POA: Diagnosis not present

## 2019-12-18 DIAGNOSIS — J3089 Other allergic rhinitis: Secondary | ICD-10-CM | POA: Diagnosis not present

## 2019-12-21 DIAGNOSIS — J3089 Other allergic rhinitis: Secondary | ICD-10-CM | POA: Diagnosis not present

## 2019-12-27 ENCOUNTER — Other Ambulatory Visit: Payer: Self-pay

## 2019-12-27 ENCOUNTER — Encounter: Payer: Self-pay | Admitting: Obstetrics & Gynecology

## 2019-12-27 ENCOUNTER — Ambulatory Visit: Payer: Medicare PPO | Admitting: Obstetrics & Gynecology

## 2019-12-27 VITALS — BP 148/90 | Ht 64.0 in | Wt 205.0 lb

## 2019-12-27 DIAGNOSIS — Z9189 Other specified personal risk factors, not elsewhere classified: Secondary | ICD-10-CM | POA: Diagnosis not present

## 2019-12-27 DIAGNOSIS — N952 Postmenopausal atrophic vaginitis: Secondary | ICD-10-CM

## 2019-12-27 DIAGNOSIS — Z01419 Encounter for gynecological examination (general) (routine) without abnormal findings: Secondary | ICD-10-CM

## 2019-12-27 DIAGNOSIS — Z6835 Body mass index (BMI) 35.0-35.9, adult: Secondary | ICD-10-CM

## 2019-12-27 DIAGNOSIS — M810 Age-related osteoporosis without current pathological fracture: Secondary | ICD-10-CM

## 2019-12-28 ENCOUNTER — Telehealth: Payer: Self-pay | Admitting: *Deleted

## 2019-12-28 DIAGNOSIS — J3089 Other allergic rhinitis: Secondary | ICD-10-CM | POA: Diagnosis not present

## 2019-12-28 MED ORDER — NONFORMULARY OR COMPOUNDED ITEM: 4 refills | Status: DC

## 2019-12-28 NOTE — Telephone Encounter (Signed)
Rx called in 

## 2019-12-28 NOTE — Telephone Encounter (Signed)
-----   Message from Princess Bruins, MD sent at 12/27/2019  3:36 PM EDT ----- Regarding: Refill of Compound Estradiol cream at [facility] Compound Estradiol cream 1/4 applicator twice a week, refill x 1 year.

## 2019-12-31 ENCOUNTER — Encounter: Payer: Self-pay | Admitting: Obstetrics & Gynecology

## 2019-12-31 NOTE — Progress Notes (Signed)
Diana Reeves March 10, 1951 [id number]   History:    69 y.o. G3P3L3   RP:  Established patient presenting for annual gyn exam   HPI: S/P TAH LSO with posterior repair.  Postmenopause, well on no systemic HRT.  No pelvic pain.  Uses estradiol compound cream vaginally twice a week.  Urine and bowel movements normal.  Breasts normal.  Body mass index 35.19.  Needs to increase physical activity and decrease calories and nutrition.  Health labs with family physician.  Colonoscopy 2020.  Past medical history,surgical history, family history and social history were all reviewed and documented in the EPIC chart.  Gynecologic History Patient's last menstrual period was 03/17/1991.  Obstetric History OB History  Gravida Para Term Preterm AB Living  3 3 3     3   SAB TAB Ectopic Multiple Live Births               # Outcome Date GA Lbr Len/2nd Weight Sex Delivery Anes PTL Lv  3 Term           2 Term           1 Term              ROS: A ROS was performed and pertinent positives and negatives are included in the history.  GENERAL: No fevers or chills. HEENT: No change in vision, no earache, sore throat or sinus congestion. NECK: No pain or stiffness. CARDIOVASCULAR: No chest pain or pressure. No palpitations. PULMONARY: No shortness of breath, cough or wheeze. GASTROINTESTINAL: No abdominal pain, nausea, vomiting or diarrhea, melena or bright red blood per rectum. GENITOURINARY: No urinary frequency, urgency, hesitancy or dysuria. MUSCULOSKELETAL: No joint or muscle pain, no back pain, no recent trauma. DERMATOLOGIC: No rash, no itching, no lesions. ENDOCRINE: No polyuria, polydipsia, no heat or cold intolerance. No recent change in weight. HEMATOLOGICAL: No anemia or easy bruising or bleeding. NEUROLOGIC: No headache, seizures, numbness, tingling or weakness. PSYCHIATRIC: No depression, no loss of interest in normal activity or change in sleep pattern.     Exam:   BP (!) 148/90    Ht 5' 4"   (1.626 m)    Wt 205 lb (93 kg)    LMP 03/17/1991    BMI 35.19 kg/m   Body mass index is 35.19 kg/m.  General appearance : Well developed well nourished female. No acute distress HEENT: Eyes: no retinal hemorrhage or exudates,  Neck supple, trachea midline, no carotid bruits, no thyroidmegaly Lungs: Clear to auscultation, no rhonchi or wheezes, or rib retractions  Heart: Regular rate and rhythm, no murmurs or gallops Breast:Examined in sitting and supine position were symmetrical in appearance, no palpable masses or tenderness,  no skin retraction, no nipple inversion, no nipple discharge, no skin discoloration, no axillary or supraclavicular lymphadenopathy Abdomen: no palpable masses or tenderness, no rebound or guarding Extremities: no edema or skin discoloration or tenderness  Pelvic: Vulva: Normal             Vagina: No gross lesions or discharge  Cervix/Uterus absent  Adnexa  Without masses or tenderness  Anus: Normal   Assessment/Plan:  69 y.o. female for annual exam   1. Well female exam with routine gynecological exam Gynecologic exam status post total abdominal hysterectomy with LSO and posterior repair.  No indication to repeat a Pap test this year.  Breast exam normal.  Screening mammogram August 2021 was negative.  Colonoscopy 2020.  Health labs with family physician.  2. Postmenopausal  atrophic vaginitis We will continue on compound estradiol twice a week.  No contraindication.  3. Postmenopausal osteoporosis Osteoporosis stable on bone density October 2020.  We will repeat a bone density October 2022.  History of treatment with Actonel many years ago.  Not currently on bone medication.  Vitamin D supplements, calcium intake of 1500 mg daily and regular weightbearing physical activity is recommended.  4. Class 2 severe obesity due to excess calories with serious comorbidity and body mass index (BMI) of 35.0 to 35.9 in adult [facility]) Recommend a lower calorie/carb diet.   Aerobic activities 5 times a week and light weightlifting every 2 days.  Princess Bruins MD, 10:35 PM 12/31/2019

## 2020-01-02 DIAGNOSIS — J3089 Other allergic rhinitis: Secondary | ICD-10-CM | POA: Diagnosis not present

## 2020-01-04 ENCOUNTER — Other Ambulatory Visit: Payer: Self-pay | Admitting: Internal Medicine

## 2020-01-04 DIAGNOSIS — I10 Essential (primary) hypertension: Secondary | ICD-10-CM

## 2020-01-04 DIAGNOSIS — E118 Type 2 diabetes mellitus with unspecified complications: Secondary | ICD-10-CM

## 2020-01-15 DIAGNOSIS — J3089 Other allergic rhinitis: Secondary | ICD-10-CM | POA: Diagnosis not present

## 2020-01-23 DIAGNOSIS — J3089 Other allergic rhinitis: Secondary | ICD-10-CM | POA: Diagnosis not present

## 2020-01-30 ENCOUNTER — Telehealth: Payer: Self-pay | Admitting: Internal Medicine

## 2020-01-30 NOTE — Telephone Encounter (Signed)
LVM for pt to rtn my call to schedule AWV with [facility]. If pt calls the office please schedule this appt.    Thanks  [phone]

## 2020-01-31 ENCOUNTER — Ambulatory Visit (INDEPENDENT_AMBULATORY_CARE_PROVIDER_SITE_OTHER): Payer: Medicare PPO | Admitting: Internal Medicine

## 2020-01-31 ENCOUNTER — Other Ambulatory Visit: Payer: Self-pay

## 2020-01-31 ENCOUNTER — Encounter: Payer: Self-pay | Admitting: Internal Medicine

## 2020-01-31 VITALS — BP 136/84 | HR 73 | Temp 98.5°F | Resp 16 | Ht 64.0 in | Wt 203.0 lb

## 2020-01-31 DIAGNOSIS — R0609 Other forms of dyspnea: Secondary | ICD-10-CM

## 2020-01-31 DIAGNOSIS — F409 Phobic anxiety disorder, unspecified: Secondary | ICD-10-CM

## 2020-01-31 DIAGNOSIS — E785 Hyperlipidemia, unspecified: Secondary | ICD-10-CM | POA: Diagnosis not present

## 2020-01-31 DIAGNOSIS — R0600 Dyspnea, unspecified: Secondary | ICD-10-CM

## 2020-01-31 DIAGNOSIS — K7581 Nonalcoholic steatohepatitis (NASH): Secondary | ICD-10-CM

## 2020-01-31 DIAGNOSIS — E118 Type 2 diabetes mellitus with unspecified complications: Secondary | ICD-10-CM | POA: Diagnosis not present

## 2020-01-31 DIAGNOSIS — I10 Essential (primary) hypertension: Secondary | ICD-10-CM | POA: Diagnosis not present

## 2020-01-31 DIAGNOSIS — F5105 Insomnia due to other mental disorder: Secondary | ICD-10-CM | POA: Diagnosis not present

## 2020-01-31 DIAGNOSIS — F418 Other specified anxiety disorders: Secondary | ICD-10-CM

## 2020-01-31 DIAGNOSIS — Z0000 Encounter for general adult medical examination without abnormal findings: Secondary | ICD-10-CM

## 2020-01-31 DIAGNOSIS — M839 Adult osteomalacia, unspecified: Secondary | ICD-10-CM | POA: Diagnosis not present

## 2020-01-31 LAB — TROPONIN I (HIGH SENSITIVITY): High Sens Troponin I: 5 ng/L (ref 2–17)

## 2020-01-31 LAB — CBC WITH DIFFERENTIAL/PLATELET
Basophils Absolute: 0 10*3/uL (ref 0.0–0.1)
Basophils Relative: 0.6 % (ref 0.0–3.0)
Eosinophils Absolute: 0.3 10*3/uL (ref 0.0–0.7)
Eosinophils Relative: 4.9 % (ref 0.0–5.0)
HCT: 38.4 % (ref 36.0–46.0)
Hemoglobin: 12.9 g/dL (ref 12.0–15.0)
Lymphocytes Relative: 37.6 % (ref 12.0–46.0)
Lymphs Abs: 2.3 10*3/uL (ref 0.7–4.0)
MCHC: 33.7 g/dL (ref 30.0–36.0)
MCV: 82.7 fl (ref 78.0–100.0)
Monocytes Absolute: 0.4 10*3/uL (ref 0.1–1.0)
Monocytes Relative: 5.9 % (ref 3.0–12.0)
Neutro Abs: 3.1 10*3/uL (ref 1.4–7.7)
Neutrophils Relative %: 51 % (ref 43.0–77.0)
Platelets: 212 10*3/uL (ref 150.0–400.0)
RBC: 4.65 Mil/uL (ref 3.87–5.11)
RDW: 14.3 % (ref 11.5–15.5)
WBC: 6 10*3/uL (ref 4.0–10.5)

## 2020-01-31 LAB — BASIC METABOLIC PANEL
BUN: 14 mg/dL (ref 6–23)
CO2: 28 mEq/L (ref 19–32)
Calcium: 9.1 mg/dL (ref 8.4–10.5)
Chloride: 105 mEq/L (ref 96–112)
Creatinine, Ser: 0.72 mg/dL (ref 0.40–1.20)
GFR: 83.99 mL/min (ref >60.00)
Glucose, Bld: 131 mg/dL — ABNORMAL HIGH (ref 70–99)
Potassium: 4 mEq/L (ref 3.5–5.1)
Sodium: 140 mEq/L (ref 135–145)

## 2020-01-31 LAB — LIPID PANEL
Cholesterol: 162 mg/dL (ref 0–200)
HDL: 45.7 mg/dL (ref >39.00)
LDL Cholesterol: 89 mg/dL (ref 0–99)
NonHDL: 116.57
Total CHOL/HDL Ratio: 4
Triglycerides: 136 mg/dL (ref 0.0–149.0)
VLDL: 27.2 mg/dL (ref 0.0–40.0)

## 2020-01-31 LAB — PROTIME-INR
INR: 1.1 ratio — ABNORMAL HIGH (ref 0.8–1.0)
Prothrombin Time: 12.7 s (ref 9.6–13.1)

## 2020-01-31 LAB — MICROALBUMIN / CREATININE URINE RATIO
Creatinine,U: 227.1 mg/dL
Microalb Creat Ratio: 0.7 mg/g (ref 0.0–30.0)
Microalb, Ur: 1.5 mg/dL (ref 0.0–1.9)

## 2020-01-31 LAB — POCT GLYCOSYLATED HEMOGLOBIN (HGB A1C): Hemoglobin A1C: 6.6 % — AB (ref 4.0–5.6)

## 2020-01-31 LAB — BRAIN NATRIURETIC PEPTIDE: Pro B Natriuretic peptide (BNP): 69 pg/mL (ref 0.0–100.0)

## 2020-01-31 LAB — HEPATIC FUNCTION PANEL
ALT: 29 U/L (ref 0–35)
AST: 34 U/L (ref 0–37)
Albumin: 4.1 g/dL (ref 3.5–5.2)
Alkaline Phosphatase: 54 U/L (ref 39–117)
Bilirubin, Direct: 0.1 mg/dL (ref 0.0–0.3)
Total Bilirubin: 0.5 mg/dL (ref 0.2–1.2)
Total Protein: 6.8 g/dL (ref 6.0–8.3)

## 2020-01-31 LAB — VITAMIN D 25 HYDROXY (VIT D DEFICIENCY, FRACTURES): VITD: 27.5 ng/mL — ABNORMAL LOW (ref 30.00–100.00)

## 2020-01-31 LAB — TSH: TSH: 1.1 u[IU]/mL (ref 0.35–4.50)

## 2020-01-31 MED ORDER — CHOLECALCIFEROL 50 MCG (2000 UT) PO TABS: 1.0000 | ORAL_TABLET | Freq: Every day | ORAL | 1 refills | Status: DC

## 2020-01-31 MED ORDER — DULOXETINE HCL 60 MG PO CPEP: 60.0000 mg | ORAL_CAPSULE | Freq: Every day | ORAL | 1 refills | Status: DC

## 2020-01-31 MED ORDER — ZOLPIDEM TARTRATE 10 MG PO TABS: 10.0000 mg | ORAL_TABLET | Freq: Every evening | ORAL | 0 refills | Status: DC | PRN

## 2020-01-31 NOTE — Patient Instructions (Signed)
Health Maintenance, Female Adopting a healthy lifestyle and getting preventive care are important in promoting health and wellness. Ask your health care provider about:  The right schedule for you to have regular tests and exams.  Things you can do on your own to prevent diseases and keep yourself healthy. What should I know about diet, weight, and exercise? Eat a healthy diet   Eat a diet that includes plenty of vegetables, fruits, low-fat dairy products, and lean protein.  Do not eat a lot of foods that are high in solid fats, added sugars, or sodium. Maintain a healthy weight Body mass index (BMI) is used to identify weight problems. It estimates body fat based on height and weight. Your health care provider can help determine your BMI and help you achieve or maintain a healthy weight. Get regular exercise Get regular exercise. This is one of the most important things you can do for your health. Most adults should:  Exercise for at least 150 minutes each week. The exercise should increase your heart rate and make you sweat (moderate-intensity exercise).  Do strengthening exercises at least twice a week. This is in addition to the moderate-intensity exercise.  Spend less time sitting. Even light physical activity can be beneficial. Watch cholesterol and blood lipids Have your blood tested for lipids and cholesterol at 71 years of age, then have this test every 5 years. Have your cholesterol levels checked more often if:  Your lipid or cholesterol levels are high.  You are older than 71 years of age.  You are at high risk for heart disease. What should I know about cancer screening? Depending on your health history and family history, you may need to have cancer screening at various ages. This may include screening for:  Breast cancer.  Cervical cancer.  Colorectal cancer.  Skin cancer.  Lung cancer. What should I know about heart disease, diabetes, and high blood  pressure? Blood pressure and heart disease  High blood pressure causes heart disease and increases the risk of stroke. This is more likely to develop in people who have high blood pressure readings, are of African descent, or are overweight.  Have your blood pressure checked: ? Every 3-5 years if you are 18-39 years of age. ? Every year if you are 40 years old or older. Diabetes Have regular diabetes screenings. This checks your fasting blood sugar level. Have the screening done:  Once every three years after age 40 if you are at a normal weight and have a low risk for diabetes.  More often and at a younger age if you are overweight or have a high risk for diabetes. What should I know about preventing infection? Hepatitis B If you have a higher risk for hepatitis B, you should be screened for this virus. Talk with your health care provider to find out if you are at risk for hepatitis B infection. Hepatitis C Testing is recommended for:  Everyone born from 1945 through 1965.  Anyone with known risk factors for hepatitis C. Sexually transmitted infections (STIs)  Get screened for STIs, including gonorrhea and chlamydia, if: ? You are sexually active and are younger than 71 years of age. ? You are older than 71 years of age and your health care provider tells you that you are at risk for this type of infection. ? Your sexual activity has changed since you were last screened, and you are at increased risk for chlamydia or gonorrhea. Ask your health care provider if   you are at risk.  Ask your health care provider about whether you are at high risk for HIV. Your health care provider may recommend a prescription medicine to help prevent HIV infection. If you choose to take medicine to prevent HIV, you should first get tested for HIV. You should then be tested every 3 months for as long as you are taking the medicine. Pregnancy  If you are about to stop having your period (premenopausal) and  you may become pregnant, seek counseling before you get pregnant.  Take 400 to 800 micrograms (mcg) of folic acid every day if you become pregnant.  Ask for birth control (contraception) if you want to prevent pregnancy. Osteoporosis and menopause Osteoporosis is a disease in which the bones lose minerals and strength with aging. This can result in bone fractures. If you are 65 years old or older, or if you are at risk for osteoporosis and fractures, ask your health care provider if you should:  Be screened for bone loss.  Take a calcium or vitamin D supplement to lower your risk of fractures.  Be given hormone replacement therapy (HRT) to treat symptoms of menopause. Follow these instructions at home: Lifestyle  Do not use any products that contain nicotine or tobacco, such as cigarettes, e-cigarettes, and chewing tobacco. If you need help quitting, ask your health care provider.  Do not use street drugs.  Do not share needles.  Ask your health care provider for help if you need support or information about quitting drugs. Alcohol use  Do not drink alcohol if: ? Your health care provider tells you not to drink. ? You are pregnant, may be pregnant, or are planning to become pregnant.  If you drink alcohol: ? Limit how much you use to 0-1 drink a day. ? Limit intake if you are breastfeeding.  Be aware of how much alcohol is in your drink. In the [place] one drink equals one 12 oz bottle of beer (355 mL), one 5 oz glass of wine (148 mL), or one 1 oz glass of hard liquor (44 mL). General instructions  Schedule regular health, dental, and eye exams.  Stay current with your vaccines.  Tell your health care provider if: ? You often feel depressed. ? You have ever been abused or do not feel safe at home. Summary  Adopting a healthy lifestyle and getting preventive care are important in promoting health and wellness.  Follow your health care provider's instructions about healthy  diet, exercising, and getting tested or screened for diseases.  Follow your health care provider's instructions on monitoring your cholesterol and blood pressure. This information is not intended to replace advice given to you by your health care provider. Make sure you discuss any questions you have with your health care provider. Document Revised: 02/23/2018 Document Reviewed: 02/23/2018 Elsevier Patient Education  2020 [employer]  

## 2020-01-31 NOTE — Progress Notes (Signed)
Subjective:  Patient ID: Diana Reeves, female    DOB: Jul 25, 1950  Age: 69 y.o. MRN: [id number]  CC: Annual Exam, Hypertension, Hyperlipidemia, and Diabetes  This visit occurred during the SARS-CoV-2 public health emergency.  Safety protocols were in place, including screening questions prior to the visit, additional usage of staff PPE, and extensive cleaning of exam room while observing appropriate contact time as indicated for disinfecting solutions.    HPI MELANIA KIRKS presents for a CPX.  She complains of mild, stable DOE after climbing a few flights of stairs.  She has had this sensation for at least a year.  She does not experience chest pain, diaphoresis, dizziness, lightheadedness, palpitations, or edema.  She tells me her blood pressure has been well controlled.  She blames the DOE on her weight and poor conditioning.  She is no longer taking pioglitazone.  She decided to lower her liver enzymes with lifestyle modifications.  Outpatient Medications Prior to Visit  Medication Sig Dispense Refill  . dorzolamide-timolol (COSOPT) 22.3-6.8 MG/ML ophthalmic solution 1 drop 2 (two) times daily.    Marland Kitchen EPINEPHrine (EPIPEN 2-PAK) 0.3 mg/0.3 mL IJ SOAJ injection     . NEXIUM 40 MG capsule TAKE 1 CAPSULE (40 MG TOTAL) BY MOUTH DAILY BEFORE BREAKFAST. 30 capsule 10  . NONFORMULARY OR COMPOUNDED ITEM Estradiol vaginal cream 0.02% insert vaginally twice weeky 90 each 4  . olmesartan (BENICAR) 40 MG tablet TAKE ONE TABLET BY MOUTH DAILY 90 tablet 0  . timolol (TIMOPTIC) 0.5 % ophthalmic solution as directed.    Marland Kitchen atorvastatin (LIPITOR) 20 MG tablet Take 1 tablet (20 mg total) by mouth daily. 90 tablet 1  . Cholecalciferol 2000 units TABS Take 1 tablet (2,000 Units total) by mouth daily. 90 tablet 3  . DULoxetine (CYMBALTA) 60 MG capsule TAKE ONE CAPSULE BY MOUTH DAILY 90 capsule 1  . pioglitazone (ACTOS) 15 MG tablet Take 1 tablet (15 mg total) by mouth daily. 90 tablet 1  . zolpidem (AMBIEN) 10  MG tablet Take 1 tablet (10 mg total) by mouth at bedtime as needed for sleep. 90 tablet 0   No facility-administered medications prior to visit.    ROS Review of Systems  Constitutional: Negative for appetite change, diaphoresis, fatigue and unexpected weight change.  HENT: Negative.   Eyes: Negative.   Respiratory: Positive for shortness of breath (DOE). Negative for cough, chest tightness and wheezing.   Cardiovascular: Negative for chest pain, palpitations and leg swelling.  Gastrointestinal: Negative for abdominal pain, constipation, diarrhea, nausea and vomiting.  Endocrine: Negative.   Genitourinary: Negative.  Negative for difficulty urinating and dysuria.  Musculoskeletal: Negative for arthralgias, back pain and myalgias.  Skin: Negative.  Negative for color change, pallor and rash.  Neurological: Negative.  Negative for dizziness, weakness and light-headedness.  Hematological: Negative for adenopathy. Does not bruise/bleed easily.  Psychiatric/Behavioral: Positive for sleep disturbance. Negative for decreased concentration, dysphoric mood and suicidal ideas. The patient is nervous/anxious.     Objective:  BP 136/84   Pulse 73   Temp 98.5 F (36.9 C) (Oral)   Resp 16   Ht 5' 4"  (1.626 m)   Wt 203 lb (92.1 kg)   LMP 03/17/1991   SpO2 98%   BMI 34.84 kg/m   BP Readings from Last 3 Encounters:  01/31/20 136/84  12/27/19 (!) 148/90  01/30/19 (!) 144/86    Wt Readings from Last 3 Encounters:  01/31/20 203 lb (92.1 kg)  12/27/19 205 lb (93 kg)  01/30/19 194 lb 8 oz (88.2 kg)    Physical Exam Vitals reviewed.  Constitutional:      Appearance: Normal appearance.  HENT:     Nose: Nose normal.     Mouth/Throat:     Mouth: Mucous membranes are moist.  Eyes:     General: No scleral icterus.    Conjunctiva/sclera: Conjunctivae normal.  Cardiovascular:     Rate and Rhythm: Normal rate and regular rhythm.     Heart sounds: No murmur heard.      Comments:  EKG- NSR, 65 bpm Normal EKG Pulmonary:     Effort: Pulmonary effort is normal.     Breath sounds: No stridor. No wheezing, rhonchi or rales.  Abdominal:     General: Abdomen is flat. Bowel sounds are normal. There is no distension.     Palpations: Abdomen is soft. There is no hepatomegaly, splenomegaly or mass.     Tenderness: There is no abdominal tenderness.  Musculoskeletal:        General: Normal range of motion.     Cervical back: Neck supple.     Right lower leg: No edema.     Left lower leg: No edema.  Lymphadenopathy:     Cervical: No cervical adenopathy.  Skin:    General: Skin is warm and dry.     Coloration: Skin is not jaundiced or pale.     Findings: No rash.  Neurological:     General: No focal deficit present.     Mental Status: She is alert.  Psychiatric:        Mood and Affect: Mood normal.        Behavior: Behavior normal.        Thought Content: Thought content normal.        Judgment: Judgment normal.     Lab Results  Component Value Date   WBC 6.0 01/31/2020   HGB 12.9 01/31/2020   HCT 38.4 01/31/2020   PLT 212.0 01/31/2020   GLUCOSE 131 (H) 01/31/2020   CHOL 162 01/31/2020   TRIG 136.0 01/31/2020   HDL 45.70 01/31/2020   LDLCALC 89 01/31/2020   ALT 29 01/31/2020   AST 34 01/31/2020   NA 140 01/31/2020   K 4.0 01/31/2020   CL 105 01/31/2020   CREATININE 0.72 01/31/2020   BUN 14 01/31/2020   CO2 28 01/31/2020   TSH 1.10 01/31/2020   INR 1.1 (H) 01/31/2020   HGBA1C 6.6 (A) 01/31/2020   MICROALBUR 1.5 01/31/2020    US Abdomen Limited RUQ  Result Date: 03/07/2018 CLINICAL DATA:  Elevated LFTs. EXAM: ULTRASOUND ABDOMEN LIMITED RIGHT UPPER QUADRANT COMPARISON:  CT 12/23/2015. FINDINGS: Gallbladder: No gallstones or wall thickening visualized. No sonographic Murphy sign noted by sonographer. Common bile duct: Diameter: 2.9 mm Liver: Diffuse increased echogenicity of the liver noted consistent fatty infiltration and/or hepatocellular disease.  Portal vein is patent on color Doppler imaging with normal direction of blood flow towards the liver. IMPRESSION: 1. Diffuse increased echogenicity liver noted consistent fatty infiltration and/or hepatocellular disease. 2.  No gallstones or biliary distention. Electronically Signed   By: Marcello Moores  Register   On: 03/07/2018 12:45    Assessment & Plan:   Ezri was seen today for annual exam, hypertension, hyperlipidemia and diabetes.  Diagnoses and all orders for this visit:  Essential hypertension, benign- Her blood pressure is well controlled.  Electrolytes and renal function are normal. -     Basic metabolic panel; Future -     TSH;  Future -     Urinalysis, Routine w reflex microscopic; Future -     Urinalysis, Routine w reflex microscopic -     TSH -     Basic metabolic panel  Nonalcoholic steatohepatitis (NASH)- Her liver enzymes have improved.  Will discontinue the pioglitazone. -     Hepatic function panel; Future -     Protime-INR; Future -     Protime-INR -     Hepatic function panel  Type II diabetes mellitus with manifestations ([place])- Her A1c is at 6.6%.  Her blood sugars are adequately well controlled. -     HM Diabetes Foot Exam -     Basic metabolic panel; Future -     Microalbumin / creatinine urine ratio; Future -     POCT glycosylated hemoglobin (Hb A1C) -     Microalbumin / creatinine urine ratio -     Basic metabolic panel  Vitamin D deficient osteomalacia -     VITAMIN D 25 Hydroxy (Vit-D Deficiency, Fractures); Future -     VITAMIN D 25 Hydroxy (Vit-D Deficiency, Fractures) -     Cholecalciferol 50 MCG (2000 UT) TABS; Take 1 tablet (2,000 Units total) by mouth daily.  Hyperlipidemia with target LDL less than 100- She has achieved her LDL goal and is doing well on the statin. -     Lipid panel; Future -     TSH; Future -     TSH -     Lipid panel  Routine general medical examination at a health care facility- Exam completed, labs reviewed, vaccines reviewed  and updated, patient education material was given.  Insomnia due to anxiety and fear -     zolpidem (AMBIEN) 10 MG tablet; Take 1 tablet (10 mg total) by mouth at bedtime as needed for sleep.  DOE (dyspnea on exertion)- She has DOE but her EKG, troponin, and BNP are all normal.  This sensation is likely from obesity and poor conditioning.  She agrees to improve her lifestyle modifications. -     CBC with Differential/Platelet; Future -     Brain natriuretic peptide; Future -     Troponin I (High Sensitivity); Future -     EKG 12-Lead -     Troponin I (High Sensitivity) -     Brain natriuretic peptide -     CBC with Differential/Platelet  Depression with anxiety- She is doing well on duloxetine.  Will continue at the current dose. -     DULoxetine (CYMBALTA) 60 MG capsule; Take 1 capsule (60 mg total) by mouth daily.   I have discontinued Cheryle Dark. Lara's Cholecalciferol, atorvastatin, and pioglitazone. I have also changed her DULoxetine. Additionally, I am having her start on Cholecalciferol. Lastly, I am having her maintain her NexIUM, timolol, EPINEPHrine, NONFORMULARY OR COMPOUNDED ITEM, olmesartan, dorzolamide-timolol, and zolpidem.  Meds ordered this encounter  Medications  . zolpidem (AMBIEN) 10 MG tablet    Sig: Take 1 tablet (10 mg total) by mouth at bedtime as needed for sleep.    Dispense:  90 tablet    Refill:  0  . DULoxetine (CYMBALTA) 60 MG capsule    Sig: Take 1 capsule (60 mg total) by mouth daily.    Dispense:  90 capsule    Refill:  1  . Cholecalciferol 50 MCG (2000 UT) TABS    Sig: Take 1 tablet (2,000 Units total) by mouth daily.    Dispense:  90 tablet    Refill:  1  In addition to time spent on CPE, I spent 50 minutes in preparing to see the patient by review of recent labs, imaging and procedures, obtaining and reviewing separately obtained history, communicating with the patient and family or caregiver, ordering medications, tests or procedures, and  documenting clinical information in the EHR including the differential Dx, treatment, and any further evaluation and other management of 1. Essential hypertension, benign 2. Nonalcoholic steatohepatitis (NASH) 3. Type II diabetes mellitus with manifestations ([place]) 4. Vitamin D deficient osteomalacia 5. Hyperlipidemia with target LDL less than 100 6. Insomnia due to anxiety and fear 7. DOE (dyspnea on exertion) 8. Depression with anxiety     Follow-up: Return in about 6 months (around 07/30/2020).  Scarlette Calico, MD

## 2020-02-01 ENCOUNTER — Encounter: Payer: Self-pay | Admitting: Internal Medicine

## 2020-02-12 DIAGNOSIS — J3089 Other allergic rhinitis: Secondary | ICD-10-CM | POA: Diagnosis not present

## 2020-02-22 DIAGNOSIS — J3089 Other allergic rhinitis: Secondary | ICD-10-CM | POA: Diagnosis not present

## 2020-02-26 DIAGNOSIS — J3089 Other allergic rhinitis: Secondary | ICD-10-CM | POA: Diagnosis not present

## 2020-03-02 DIAGNOSIS — M25562 Pain in left knee: Secondary | ICD-10-CM | POA: Diagnosis not present

## 2020-03-02 DIAGNOSIS — M25551 Pain in right hip: Secondary | ICD-10-CM | POA: Diagnosis not present

## 2020-03-02 DIAGNOSIS — M1712 Unilateral primary osteoarthritis, left knee: Secondary | ICD-10-CM | POA: Diagnosis not present

## 2020-03-02 DIAGNOSIS — M7061 Trochanteric bursitis, right hip: Secondary | ICD-10-CM | POA: Diagnosis not present

## 2020-03-05 DIAGNOSIS — J3089 Other allergic rhinitis: Secondary | ICD-10-CM | POA: Diagnosis not present

## 2020-03-11 DIAGNOSIS — M1712 Unilateral primary osteoarthritis, left knee: Secondary | ICD-10-CM | POA: Diagnosis not present

## 2020-03-11 DIAGNOSIS — M25562 Pain in left knee: Secondary | ICD-10-CM | POA: Diagnosis not present

## 2020-03-18 DIAGNOSIS — M25562 Pain in left knee: Secondary | ICD-10-CM | POA: Diagnosis not present

## 2020-03-18 DIAGNOSIS — M1712 Unilateral primary osteoarthritis, left knee: Secondary | ICD-10-CM | POA: Diagnosis not present

## 2020-03-22 DIAGNOSIS — H401412 Capsular glaucoma with pseudoexfoliation of lens, right eye, moderate stage: Secondary | ICD-10-CM | POA: Diagnosis not present

## 2020-03-22 DIAGNOSIS — H401423 Capsular glaucoma with pseudoexfoliation of lens, left eye, severe stage: Secondary | ICD-10-CM | POA: Diagnosis not present

## 2020-03-25 DIAGNOSIS — J3089 Other allergic rhinitis: Secondary | ICD-10-CM | POA: Diagnosis not present

## 2020-04-02 ENCOUNTER — Other Ambulatory Visit: Payer: Self-pay | Admitting: Internal Medicine

## 2020-04-02 DIAGNOSIS — E118 Type 2 diabetes mellitus with unspecified complications: Secondary | ICD-10-CM

## 2020-04-02 DIAGNOSIS — I10 Essential (primary) hypertension: Secondary | ICD-10-CM

## 2020-04-04 DIAGNOSIS — J3089 Other allergic rhinitis: Secondary | ICD-10-CM | POA: Diagnosis not present

## 2020-04-04 DIAGNOSIS — J301 Allergic rhinitis due to pollen: Secondary | ICD-10-CM | POA: Diagnosis not present

## 2020-05-06 DIAGNOSIS — J3089 Other allergic rhinitis: Secondary | ICD-10-CM | POA: Diagnosis not present

## 2020-05-13 ENCOUNTER — Telehealth: Payer: Self-pay | Admitting: Internal Medicine

## 2020-05-13 NOTE — Telephone Encounter (Signed)
LVM for pt to rtn my call to schedule AWV with [facility]. Please schedule AWV with [facility] if pr calls the office.

## 2020-05-27 ENCOUNTER — Other Ambulatory Visit: Payer: Self-pay | Admitting: Internal Medicine

## 2020-05-27 DIAGNOSIS — E118 Type 2 diabetes mellitus with unspecified complications: Secondary | ICD-10-CM

## 2020-05-27 DIAGNOSIS — K7581 Nonalcoholic steatohepatitis (NASH): Secondary | ICD-10-CM

## 2020-05-27 DIAGNOSIS — J3089 Other allergic rhinitis: Secondary | ICD-10-CM | POA: Diagnosis not present

## 2020-06-06 DIAGNOSIS — M79662 Pain in left lower leg: Secondary | ICD-10-CM | POA: Diagnosis not present

## 2020-06-06 DIAGNOSIS — M79661 Pain in right lower leg: Secondary | ICD-10-CM | POA: Diagnosis not present

## 2020-06-06 DIAGNOSIS — M545 Low back pain, unspecified: Secondary | ICD-10-CM | POA: Diagnosis not present

## 2020-06-07 ENCOUNTER — Other Ambulatory Visit: Payer: Self-pay

## 2020-06-07 ENCOUNTER — Other Ambulatory Visit (HOSPITAL_COMMUNITY): Payer: Self-pay | Admitting: Orthopedic Surgery

## 2020-06-07 ENCOUNTER — Ambulatory Visit (HOSPITAL_COMMUNITY)
Admission: RE | Admit: 2020-06-07 | Discharge: 2020-06-07 | Disposition: A | Discharge status: Home / Self Care | Payer: Medicare PPO | Source: Ambulatory Visit | Attending: Orthopedic Surgery | Admitting: Orthopedic Surgery

## 2020-06-07 DIAGNOSIS — R52 Pain, unspecified: Secondary | ICD-10-CM

## 2020-06-10 DIAGNOSIS — J3089 Other allergic rhinitis: Secondary | ICD-10-CM | POA: Diagnosis not present

## 2020-06-10 DIAGNOSIS — J301 Allergic rhinitis due to pollen: Secondary | ICD-10-CM | POA: Diagnosis not present

## 2020-06-12 ENCOUNTER — Other Ambulatory Visit: Payer: Self-pay

## 2020-06-12 ENCOUNTER — Ambulatory Visit (INDEPENDENT_AMBULATORY_CARE_PROVIDER_SITE_OTHER): Payer: Medicare PPO

## 2020-06-12 VITALS — BP 122/70 | HR 70 | Temp 98.2°F | Ht 64.0 in | Wt 213.6 lb

## 2020-06-12 DIAGNOSIS — Z0000 Encounter for general adult medical examination without abnormal findings: Secondary | ICD-10-CM | POA: Diagnosis not present

## 2020-06-12 NOTE — Progress Notes (Signed)
Subjective:   Diana Reeves is a 70 y.o. female who presents for Medicare Annual (Subsequent) preventive examination.  Review of Systems    No ROS. Medicare Wellness Visit. Additional risk factors are reflected in social history. Cardiac Risk Factors include: advanced age (>93mn, >>84women);hypertension;family history of premature cardiovascular disease;dyslipidemia;obesity (BMI >30kg/m2)     Objective:    Today's Vitals   06/12/20 1346  BP: 122/70  Pulse: 70  Temp: 98.2 F (36.8 C)  SpO2: 97%  Weight: 213 lb 9.6 oz (96.9 kg)  Height: 5' 4"  (1.626 m)   Body mass index is 36.66 kg/m.  Advanced Directives 06/12/2020 01/30/2019 01/26/2018 01/08/2017 11/06/2015  Does Patient Have a Medical Advance Directive? Yes Yes Yes Yes Yes  Type of Advance Directive Living will;Healthcare Power of [name] [name] [name] [name]  Does patient want to make changes to medical advance directive? No - Patient declined - - No - Patient declined No - Patient declined  Copy of [place] Chart? No - copy requested No - copy requested No - copy requested No - copy requested Yes    Current Medications (verified) Outpatient Encounter Medications as of 06/12/2020  Medication Sig  . Cholecalciferol 50 MCG (2000 UT) TABS Take 1 tablet (2,000 Units total) by mouth daily.  . dorzolamide-timolol (COSOPT) 22.3-6.8 MG/ML ophthalmic solution 1 drop 2 (two) times daily.  . DULoxetine (CYMBALTA) 60 MG capsule Take 1 capsule (60 mg total) by mouth daily.  .Marland KitchenEPINEPHrine (EPIPEN 2-PAK) 0.3 mg/0.3 mL IJ SOAJ injection   . NEXIUM 40 MG capsule TAKE 1 CAPSULE (40 MG TOTAL) BY MOUTH DAILY BEFORE BREAKFAST.  . NONFORMULARY OR COMPOUNDED ITEM Estradiol vaginal cream 0.02% insert vaginally twice weeky  . olmesartan (BENICAR) 40 MG tablet TAKE ONE TABLET BY  MOUTH DAILY  . timolol (TIMOPTIC) 0.5 % ophthalmic solution as directed.  . zolpidem (AMBIEN) 10 MG tablet Take 1 tablet (10 mg total) by mouth at bedtime as needed for sleep.   No facility-administered encounter medications on file as of 06/12/2020.    Allergies (verified) Dust mite extract and Mold extract [trichophyton mentagrophyte]   History: Past Medical History:  Diagnosis Date  . Allergic state 12/26/2016  . Basal cell carcinoma   . Benign neoplasm of colon   . Degenerative cervical disc   . Depression   . Diverticulosis of colon (without mention of hemorrhage)   . Esophageal reflux   . Glaucoma   . Osteoporosis, unspecified 12/2018   2007 T score -3.0, 2012 T score -1.9, 2015 T score -2.1 overall stable, 2018 T score -2.0, 2020 T score -1.8  . Other chronic nonalcoholic liver disease   . Sinusitis 12/26/2016  . Stricture and stenosis of esophagus    Past Surgical History:  Procedure Laterality Date  . ABDOMINAL HYSTERECTOMY  1993   TAH-LSO-Post. repair-[place]  . COLONOSCOPY    . EYE SURGERY  10/18/2012   lt eye  . knee arthroscop Right 2018  . left arm surgery  7-09  . POLYPECTOMY     Family History  Problem Relation Age of Onset  . Cancer Father        colon  . Hypertension Father   . Colon cancer Father 855 . Heart disease Mother   . Cancer Maternal Grandmother        uterine  . Diabetes Paternal Grandfather   .  Cancer Son        Testicular cancer  . Heart attack Son   . Esophageal cancer Neg Hx   . Stomach cancer Neg Hx   . Rectal cancer Neg Hx    Social History   Socioeconomic History  . Marital status: Divorced    Spouse name: Not on file  . Number of children: 3  . Years of education: Not on file  . Highest education level: Not on file  Occupational History  . Occupation: retired  Tobacco Use  . Smoking status: Former Research scientist (life sciences)  . Smokeless tobacco: Never Used  Vaping Use  . Vaping Use: Never used  Substance and Sexual Activity  .  Alcohol use: Yes    Alcohol/week: 2.0 standard drinks    Types: 2 Glasses of wine per week    Comment: occasional  . Drug use: No  . Sexual activity: Not Currently    Comment: 1st intercourse 70 yo-More than 5 partner,  Other Topics Concern  . Not on file  Social History Narrative  . Not on file   Social Determinants of Health   Financial Resource Strain: Low Risk   . Difficulty of Paying Living Expenses: Not hard at all  Food Insecurity: No Food Insecurity  . Worried About Charity fundraiser in the Last Year: Never true  . Ran Out of Food in the Last Year: Never true  Transportation Needs: No Transportation Needs  . Lack of Transportation (Medical): No  . Lack of Transportation (Non-Medical): No  Physical Activity: Inactive  . Days of Exercise per Week: 0 days  . Minutes of Exercise per Session: 0 min  Stress: No Stress Concern Present  . Feeling of Stress : Not at all  Social Connections: Unknown  . Frequency of Communication with Friends and Family: More than three times a week  . Frequency of Social Gatherings with Friends and Family: More than three times a week  . Attends Religious Services: More than 4 times per year  . Active Member of Clubs or Organizations: No  . Attends Archivist Meetings: More than 4 times per year  . Marital Status: Not on file    Tobacco Counseling Counseling given: Not Answered   Clinical Intake:  Pre-visit preparation completed: Yes  Pain : No/denies pain     BMI - recorded: 36.66 Nutritional Status: BMI > 30  Obese Nutritional Risks: None Diabetes: No  What is the last grade level you completed in school?: Bachelort's Degree  Diabetic? yes  Interpreter Needed?: No  Information entered by :: [name], LPN   Activities of Daily Living In your present state of health, do you have any difficulty performing the following activities: 06/12/2020  Hearing? N  Vision? N  Difficulty concentrating or making  decisions? N  Walking or climbing stairs? N  Dressing or bathing? N  Doing errands, shopping? N  Preparing Food and eating ? N  Using the Toilet? N  In the past six months, have you accidently leaked urine? N  Do you have problems with loss of bowel control? N  Managing your Medications? N  Managing your Finances? N  Housekeeping or managing your Housekeeping? N  Some recent data might be hidden    Patient Care Team: Janith Lima, MD as PCP - General (Internal Medicine)  Indicate any recent Medical Services you may have received from other than [facility] providers in the past year (date may be approximate).     Assessment:  This is a routine wellness examination for Tanvi.  Hearing/Vision screen No exam data present  Dietary issues and exercise activities discussed: Current Exercise Habits: The patient has a physically strenuous job, but has no regular exercise apart from work., Exercise limited by: None identified  Goals    . Patient Stated     I want to walk more and get back to water aerobics.    . Patient Stated     I want continue to be healthy and maintain my weight.     . Patient Stated     I would like to lose 10 pounds.      Depression Screen PHQ 2/9 Scores 06/12/2020 01/31/2020 01/30/2019 01/28/2018 01/26/2018 01/08/2017 01/06/2017  PHQ - 2 Score 0 0 0 0 0 0 0  PHQ- 9 Score - 0 - - 2 - 3    Fall Risk Fall Risk  06/12/2020 01/31/2020 01/30/2019 01/28/2018 01/26/2018  Falls in the past year? 1 0 0 0 0  Number falls in past yr: 0 - 0 - -  Injury with Fall? 1 - 0 - -  Follow up Falls evaluation completed - - - -    FALL RISK PREVENTION PERTAINING TO THE HOME:  Any stairs in or around the home? Yes  If so, are there any without handrails? No  Home free of loose throw rugs in walkways, pet beds, electrical cords, etc? Yes  Adequate lighting in your home to reduce risk of falls? Yes   ASSISTIVE DEVICES UTILIZED TO PREVENT FALLS:  Life alert? No  Use of a  cane, walker or w/c? No  Grab bars in the bathroom? Yes  Shower chair or bench in shower? No  Elevated toilet seat or a handicapped toilet? Yes   TIMED UP AND GO:  Was the test performed? No .  Length of time to ambulate 10 feet: 0 sec.   Gait steady and fast without use of assistive device  Cognitive Function: Normal cognitive status assessed by direct observation by this Nurse Health Advisor. No abnormalities found.          Immunizations Immunization History  Administered Date(s) Administered  . Influenza, High Dose Seasonal PF 01/06/2017, 12/09/2017  . Influenza,inj,Quad PF,6+ Mos 11/05/2015, 12/09/2018  . Influenza-Unspecified 12/19/2012, 12/15/2014, 12/15/2019  . PFIZER(Purple Top)SARS-COV-2 Vaccination 04/23/2019, 05/18/2019, 12/15/2019  . Pneumococcal Conjugate-13 08/29/2014  . Pneumococcal Polysaccharide-23 01/01/2011, 01/26/2018  . Tdap 10/08/2010  . Zoster 06/20/2012    TDAP status: Up to date  Flu Vaccine status: Up to date  Pneumococcal vaccine status: Up to date  Covid-19 vaccine status: Completed vaccines  Qualifies for Shingles Vaccine? Yes   Zostavax completed Yes   Shingrix Completed?: No.    Education has been provided regarding the importance of this vaccine. Patient has been advised to call insurance company to determine out of pocket expense if they have not yet received this vaccine. Advised may also receive vaccine at local pharmacy or Health Dept. Verbalized acceptance and understanding.  Screening Tests Health Maintenance  Topic Date Due  . OPHTHALMOLOGY EXAM  05/22/2020  . HEMOGLOBIN A1C  07/30/2020  . TETANUS/TDAP  10/07/2020  . FOOT EXAM  01/30/2021  . COLONOSCOPY (Pts 45-85yr Insurance coverage will need to be confirmed)  04/13/2021  . MAMMOGRAM  10/26/2021  . INFLUENZA VACCINE  Completed  . DEXA SCAN  Completed  . COVID-19 Vaccine  Completed  . Hepatitis C Screening  Completed  . PNA vac Low Risk Adult  Completed  . HPV  [place] Maintenance  Health Maintenance Due  Topic Date Due  . OPHTHALMOLOGY EXAM  05/22/2020    Colorectal cancer screening: Type of screening: Colonoscopy. Completed 04/13/2018. Repeat every 3 years  Mammogram status: Completed 11/01/2019. Repeat every year  Bone Density status: Completed 01/03/2019. Results reflect: Bone density results: NORMAL. Repeat every 0 years.  Lung Cancer Screening: (Low Dose CT Chest recommended if Age 41-80 years, 30 pack-year currently smoking OR have quit w/in 15years.) does qualify.   Lung Cancer Screening Referral: no  Additional Screening:  Hepatitis C Screening: does qualify; Completed yes  Vision Screening: Recommended annual ophthalmology exams for early detection of glaucoma and other disorders of the eye. Is the patient up to date with their annual eye exam?  Yes  Who is the provider or what is the name of the office in which the patient attends annual eye exams? Luberta Mutter, MD. If pt is not established with a provider, would they like to be referred to a provider to establish care? No .   Dental Screening: Recommended annual dental exams for proper oral hygiene  Community Resource Referral / Chronic Care Management: CRR required this visit?  No   CCM required this visit?  No      Plan:     I have personally reviewed and noted the following in the patient's chart:   . Medical and social history . Use of alcohol, tobacco or illicit drugs  . Current medications and supplements . Functional ability and status . Nutritional status . Physical activity . Advanced directives . List of other physicians . Hospitalizations, surgeries, and ER visits in previous 12 months . Vitals . Screenings to include cognitive, depression, and falls . Referrals and appointments  In addition, I have reviewed and discussed with patient certain preventive protocols, quality metrics, and best practice recommendations. A written  personalized care plan for preventive services as well as general preventive health recommendations were provided to patient.     Sheral Flow, LPN   0/07/1100   Nurse Notes:  Medications reviewed with patient; no opioid use noted.

## 2020-06-12 NOTE — Patient Instructions (Addendum)
Diana Reeves , Thank you for taking time to come for your Medicare Wellness Visit. I appreciate your ongoing commitment to your health goals. Please review the following plan we discussed and let me know if I can assist you in the future.   Screening recommendations/referrals: Colonoscopy: 04/13/2018; due every 3 years Mammogram: 11/01/2019; due every 1-2 years Bone Density: 01/03/2019; results: stable Recommended yearly ophthalmology/optometry visit for glaucoma screening and checkup Recommended yearly dental visit for hygiene and checkup  Vaccinations: Influenza vaccine: 12/15/2019 Pneumococcal vaccine: 01/26/2018, 08/29/2014 Tdap vaccine: 10/08/2010; due every 10 years Shingles vaccine: no record; will check with Kristopher Oppenheim for vaccine date   Covid-19: 04/23/2019, 05/18/2019, 12/15/2019  Advanced directives: Please bring a copy of your health care power of attorney and living will to the office at your convenience.  Conditions/risks identified: Yes; Reviewed health maintenance screenings with patient today and relevant education, vaccines, and/or referrals were provided. Please continue to do your personal lifestyle choices by: daily care of teeth and gums, regular physical activity (goal should be 5 days a week for 30 minutes), eat a healthy diet, avoid tobacco and drug use, limiting any alcohol intake, taking a low-dose aspirin (if not allergic or have been advised by your provider otherwise) and taking vitamins and minerals as recommended by your provider. Continue doing brain stimulating activities (puzzles, reading, adult coloring books, staying active) to keep memory sharp. Continue to eat heart healthy diet (full of fruits, vegetables, whole grains, lean protein, water--limit salt, fat, and sugar intake) and increase physical activity as tolerated.  Next appointment: Please schedule your next Medicare Wellness Visit with your Nurse Health Advisor in 1 year by calling  [phone].   Preventive Care 72 Years and Older, Female Preventive care refers to lifestyle choices and visits with your health care provider that can promote health and wellness. What does preventive care include?  A yearly physical exam. This is also called an annual well check.  Dental exams once or twice a year.  Routine eye exams. Ask your health care provider how often you should have your eyes checked.  Personal lifestyle choices, including:  Daily care of your teeth and gums.  Regular physical activity.  Eating a healthy diet.  Avoiding tobacco and drug use.  Limiting alcohol use.  Practicing safe sex.  Taking low-dose aspirin every day.  Taking vitamin and mineral supplements as recommended by your health care provider. What happens during an annual well check? The services and screenings done by your health care provider during your annual well check will depend on your age, overall health, lifestyle risk factors, and family history of disease. Counseling  Your health care provider may ask you questions about your:  Alcohol use.  Tobacco use.  Drug use.  Emotional well-being.  Home and relationship well-being.  Sexual activity.  Eating habits.  History of falls.  Memory and ability to understand (cognition).  Work and work Statistician.  Reproductive health. Screening  You may have the following tests or measurements:  Height, weight, and BMI.  Blood pressure.  Lipid and cholesterol levels. These may be checked every 5 years, or more frequently if you are over 72 years old.  Skin check.  Lung cancer screening. You may have this screening every year starting at age 72 if you have a 30-pack-year history of smoking and currently smoke or have quit within the past 15 years.  Fecal occult blood test (FOBT) of the stool. You may have this test every year starting at age  72.  Flexible sigmoidoscopy or colonoscopy. You may have a  sigmoidoscopy every 5 years or a colonoscopy every 10 years starting at age 72.  Hepatitis C blood test.  Hepatitis B blood test.  Sexually transmitted disease (STD) testing.  Diabetes screening. This is done by checking your blood sugar (glucose) after you have not eaten for a while (fasting). You may have this done every 1-3 years.  Bone density scan. This is done to screen for osteoporosis. You may have this done starting at age 72.  Mammogram. This may be done every 1-2 years. Talk to your health care provider about how often you should have regular mammograms. Talk with your health care provider about your test results, treatment options, and if necessary, the need for more tests. Vaccines  Your health care provider may recommend certain vaccines, such as:  Influenza vaccine. This is recommended every year.  Tetanus, diphtheria, and acellular pertussis (Tdap, Td) vaccine. You may need a Td booster every 10 years.  Zoster vaccine. You may need this after age 72.  Pneumococcal 13-valent conjugate (PCV13) vaccine. One dose is recommended after age 72.  Pneumococcal polysaccharide (PPSV23) vaccine. One dose is recommended after age 72. Talk to your health care provider about which screenings and vaccines you need and how often you need them. This information is not intended to replace advice given to you by your health care provider. Make sure you discuss any questions you have with your health care provider. Document Released: 03/29/2015 Document Revised: 11/20/2015 Document Reviewed: 01/01/2015 Elsevier Interactive Patient Education  2017 [employer] Prevention in the Home Falls can cause injuries. They can happen to people of all ages. There are many things you can do to make your home safe and to help prevent falls. What can I do on the outside of my home?  Regularly fix the edges of walkways and driveways and fix any cracks.  Remove anything that might make you  trip as you walk through a door, such as a raised step or threshold.  Trim any bushes or trees on the path to your home.  Use bright outdoor lighting.  Clear any walking paths of anything that might make someone trip, such as rocks or tools.  Regularly check to see if handrails are loose or broken. Make sure that both sides of any steps have handrails.  Any raised decks and porches should have guardrails on the edges.  Have any leaves, snow, or ice cleared regularly.  Use sand or salt on walking paths during winter.  Clean up any spills in your garage right away. This includes oil or grease spills. What can I do in the bathroom?  Use night lights.  Install grab bars by the toilet and in the tub and shower. Do not use towel bars as grab bars.  Use non-skid mats or decals in the tub or shower.  If you need to sit down in the shower, use a plastic, non-slip stool.  Keep the floor dry. Clean up any water that spills on the floor as soon as it happens.  Remove soap buildup in the tub or shower regularly.  Attach bath mats securely with double-sided non-slip rug tape.  Do not have throw rugs and other things on the floor that can make you trip. What can I do in the bedroom?  Use night lights.  Make sure that you have a light by your bed that is easy to reach.  Do not use any sheets  or blankets that are too big for your bed. They should not hang down onto the floor.  Have a firm chair that has side arms. You can use this for support while you get dressed.  Do not have throw rugs and other things on the floor that can make you trip. What can I do in the kitchen?  Clean up any spills right away.  Avoid walking on wet floors.  Keep items that you use a lot in easy-to-reach places.  If you need to reach something above you, use a strong step stool that has a grab bar.  Keep electrical cords out of the way.  Do not use floor polish or wax that makes floors slippery. If  you must use wax, use non-skid floor wax.  Do not have throw rugs and other things on the floor that can make you trip. What can I do with my stairs?  Do not leave any items on the stairs.  Make sure that there are handrails on both sides of the stairs and use them. Fix handrails that are broken or loose. Make sure that handrails are as long as the stairways.  Check any carpeting to make sure that it is firmly attached to the stairs. Fix any carpet that is loose or worn.  Avoid having throw rugs at the top or bottom of the stairs. If you do have throw rugs, attach them to the floor with carpet tape.  Make sure that you have a light switch at the top of the stairs and the bottom of the stairs. If you do not have them, ask someone to add them for you. What else can I do to help prevent falls?  Wear shoes that:  Do not have high heels.  Have rubber bottoms.  Are comfortable and fit you well.  Are closed at the toe. Do not wear sandals.  If you use a stepladder:  Make sure that it is fully opened. Do not climb a closed stepladder.  Make sure that both sides of the stepladder are locked into place.  Ask someone to hold it for you, if possible.  Clearly mark and make sure that you can see:  Any grab bars or handrails.  First and last steps.  Where the edge of each step is.  Use tools that help you move around (mobility aids) if they are needed. These include:  Canes.  Walkers.  Scooters.  Crutches.  Turn on the lights when you go into a dark area. Replace any light bulbs as soon as they burn out.  Set up your furniture so you have a clear path. Avoid moving your furniture around.  If any of your floors are uneven, fix them.  If there are any pets around you, be aware of where they are.  Review your medicines with your doctor. Some medicines can make you feel dizzy. This can increase your chance of falling. Ask your doctor what other things that you can do to  help prevent falls. This information is not intended to replace advice given to you by your health care provider. Make sure you discuss any questions you have with your health care provider. Document Released: 12/27/2008 Document Revised: 08/08/2015 Document Reviewed: 04/06/2014 Elsevier Interactive Patient Education  2017 [employer].

## 2020-06-21 DIAGNOSIS — H401412 Capsular glaucoma with pseudoexfoliation of lens, right eye, moderate stage: Secondary | ICD-10-CM | POA: Diagnosis not present

## 2020-06-24 DIAGNOSIS — J3089 Other allergic rhinitis: Secondary | ICD-10-CM | POA: Diagnosis not present

## 2020-07-08 ENCOUNTER — Telehealth (INDEPENDENT_AMBULATORY_CARE_PROVIDER_SITE_OTHER): Payer: Medicare PPO | Admitting: Internal Medicine

## 2020-07-08 DIAGNOSIS — J329 Chronic sinusitis, unspecified: Secondary | ICD-10-CM

## 2020-07-08 MED ORDER — METHYLPREDNISOLONE 4 MG PO TBPK: ORAL_TABLET | ORAL | 0 refills | Status: DC

## 2020-07-08 MED ORDER — DOXYCYCLINE HYCLATE 100 MG PO TABS: 100.0000 mg | ORAL_TABLET | Freq: Two times a day (BID) | ORAL | 0 refills | Status: DC

## 2020-07-08 NOTE — Progress Notes (Signed)
Patient ID: Diana Reeves, female   DOB: [birth year], 70 y.o.   MRN: [id number]  Cumulative time during 7-day interval 13 min, there was not an associated office visit for this concern within a 7 day period.  Verbal consent for services obtained from patient prior to services given.  Names of all persons present for services: Cathlean Cower, MD, patient  Chief complaint: sinus pain congestion  History, background, results pertinent:   Here with 9 days acute onset feverish, facial pain, pressure, headache, general weakness and malaise, and greenish yellowish d/c, with mild ST and cough, but pt denies chest pain, wheezing, increased sob or doe, orthopnea, PND, increased LE swelling, palpitations, dizziness or syncope.  Allergist is out of town,   Past Medical History:  Diagnosis Date  . Allergic state 12/26/2016  . Basal cell carcinoma   . Benign neoplasm of colon   . Degenerative cervical disc   . Depression   . Diverticulosis of colon (without mention of hemorrhage)   . Esophageal reflux   . Glaucoma   . Osteoporosis, unspecified 12/2018   2007 T score -3.0, 2012 T score -1.9, 2015 T score -2.1 overall stable, 2018 T score -2.0, 2020 T score -1.8  . Other chronic nonalcoholic liver disease   . Sinusitis 12/26/2016  . Stricture and stenosis of esophagus    No results found for this or any previous visit (from the past 10 hour(s)).   Current Outpatient Medications (Endocrine & Metabolic):  .  methylPREDNISolone (MEDROL DOSEPAK) 4 MG TBPK tablet, 3 tab by mouth x 4day, 2tab x 3day, 1 tab x 3day  Current Outpatient Medications (Cardiovascular):  Marland Kitchen  EPINEPHrine (EPIPEN 2-PAK) 0.3 mg/0.3 mL IJ SOAJ injection,  .  olmesartan (BENICAR) 40 MG tablet, TAKE ONE TABLET BY MOUTH DAILY     Current Outpatient Medications (Other):  .  doxycycline (VIBRA-TABS) 100 MG tablet, Take 1 tablet (100 mg total) by mouth 2 (two) times daily. .  Cholecalciferol 50 MCG (2000 UT) TABS, Take 1 tablet (2,000  Units total) by mouth daily. .  dorzolamide-timolol (COSOPT) 22.3-6.8 MG/ML ophthalmic solution, 1 drop 2 (two) times daily. .  DULoxetine (CYMBALTA) 60 MG capsule, Take 1 capsule (60 mg total) by mouth daily. Marland Kitchen  NEXIUM 40 MG capsule, TAKE 1 CAPSULE (40 MG TOTAL) BY MOUTH DAILY BEFORE BREAKFAST. .  NONFORMULARY OR COMPOUNDED ITEM, Estradiol vaginal cream 0.02% insert vaginally twice weeky .  timolol (TIMOPTIC) 0.5 % ophthalmic solution, as directed. .  zolpidem (AMBIEN) 10 MG tablet, Take 1 tablet (10 mg total) by mouth at bedtime as needed for sleep. Lab Results  Component Value Date   WBC 6.0 01/31/2020   HGB 12.9 01/31/2020   HCT 38.4 01/31/2020   PLT 212.0 01/31/2020   GLUCOSE 131 (H) 01/31/2020   CHOL 162 01/31/2020   TRIG 136.0 01/31/2020   HDL 45.70 01/31/2020   LDLCALC 89 01/31/2020   ALT 29 01/31/2020   AST 34 01/31/2020   NA 140 01/31/2020   K 4.0 01/31/2020   CL 105 01/31/2020   CREATININE 0.72 01/31/2020   BUN 14 01/31/2020   CO2 28 01/31/2020   TSH 1.10 01/31/2020   INR 1.1 (H) 01/31/2020   HGBA1C 6.6 (A) 01/31/2020   MICROALBUR 1.5 01/31/2020    A/P/next steps:   1)  Sinusitis - Mild to mod, for antibx course,  to f/u any worsening symptoms or concerns  2)  Allergic rhinitis - for medrol dose pack asd,  to f/u any worsening  symptoms or concerns  Cathlean Cower MD

## 2020-07-15 ENCOUNTER — Encounter: Payer: Self-pay | Admitting: Internal Medicine

## 2020-07-15 DIAGNOSIS — J329 Chronic sinusitis, unspecified: Secondary | ICD-10-CM | POA: Insufficient documentation

## 2020-07-15 NOTE — Patient Instructions (Signed)
Please take all new medication as prescribed 

## 2020-07-15 NOTE — Assessment & Plan Note (Signed)
See notes

## 2020-07-17 DIAGNOSIS — J3089 Other allergic rhinitis: Secondary | ICD-10-CM | POA: Diagnosis not present

## 2020-07-22 DIAGNOSIS — J3089 Other allergic rhinitis: Secondary | ICD-10-CM | POA: Diagnosis not present

## 2020-07-26 DIAGNOSIS — J301 Allergic rhinitis due to pollen: Secondary | ICD-10-CM | POA: Diagnosis not present

## 2020-07-26 DIAGNOSIS — J3089 Other allergic rhinitis: Secondary | ICD-10-CM | POA: Diagnosis not present

## 2020-07-30 ENCOUNTER — Ambulatory Visit: Payer: Medicare PPO | Admitting: Internal Medicine

## 2020-07-31 DIAGNOSIS — H401412 Capsular glaucoma with pseudoexfoliation of lens, right eye, moderate stage: Secondary | ICD-10-CM | POA: Diagnosis not present

## 2020-08-13 DIAGNOSIS — J3089 Other allergic rhinitis: Secondary | ICD-10-CM | POA: Diagnosis not present

## 2020-08-20 DIAGNOSIS — J3081 Allergic rhinitis due to animal (cat) (dog) hair and dander: Secondary | ICD-10-CM | POA: Diagnosis not present

## 2020-08-20 DIAGNOSIS — J3089 Other allergic rhinitis: Secondary | ICD-10-CM | POA: Diagnosis not present

## 2020-08-20 DIAGNOSIS — J301 Allergic rhinitis due to pollen: Secondary | ICD-10-CM | POA: Diagnosis not present

## 2020-08-26 DIAGNOSIS — J3089 Other allergic rhinitis: Secondary | ICD-10-CM | POA: Diagnosis not present

## 2020-09-02 DIAGNOSIS — J3089 Other allergic rhinitis: Secondary | ICD-10-CM | POA: Diagnosis not present

## 2020-09-02 DIAGNOSIS — K219 Gastro-esophageal reflux disease without esophagitis: Secondary | ICD-10-CM | POA: Diagnosis not present

## 2020-09-23 ENCOUNTER — Other Ambulatory Visit: Payer: Self-pay | Admitting: Internal Medicine

## 2020-09-23 DIAGNOSIS — F418 Other specified anxiety disorders: Secondary | ICD-10-CM

## 2020-09-25 ENCOUNTER — Telehealth: Payer: Self-pay | Admitting: Internal Medicine

## 2020-09-25 DIAGNOSIS — I10 Essential (primary) hypertension: Secondary | ICD-10-CM

## 2020-09-25 DIAGNOSIS — J3089 Other allergic rhinitis: Secondary | ICD-10-CM | POA: Diagnosis not present

## 2020-09-25 DIAGNOSIS — E118 Type 2 diabetes mellitus with unspecified complications: Secondary | ICD-10-CM

## 2020-09-26 ENCOUNTER — Other Ambulatory Visit: Payer: Self-pay | Admitting: Internal Medicine

## 2020-09-26 DIAGNOSIS — E118 Type 2 diabetes mellitus with unspecified complications: Secondary | ICD-10-CM

## 2020-09-26 DIAGNOSIS — I10 Essential (primary) hypertension: Secondary | ICD-10-CM

## 2020-09-26 MED ORDER — OLMESARTAN MEDOXOMIL 40 MG PO TABS: 40.0000 mg | ORAL_TABLET | Freq: Every day | ORAL | 0 refills | Status: DC

## 2020-09-26 NOTE — Telephone Encounter (Signed)
    Patient is requesting a short supply of olmesartan (BENICAR) 40 MG tablet until her 8/15 appointment

## 2020-09-27 DIAGNOSIS — J3089 Other allergic rhinitis: Secondary | ICD-10-CM | POA: Diagnosis not present

## 2020-10-09 DIAGNOSIS — J3089 Other allergic rhinitis: Secondary | ICD-10-CM | POA: Diagnosis not present

## 2020-10-21 DIAGNOSIS — J3089 Other allergic rhinitis: Secondary | ICD-10-CM | POA: Diagnosis not present

## 2020-10-22 DIAGNOSIS — L820 Inflamed seborrheic keratosis: Secondary | ICD-10-CM | POA: Diagnosis not present

## 2020-10-22 DIAGNOSIS — L918 Other hypertrophic disorders of the skin: Secondary | ICD-10-CM | POA: Diagnosis not present

## 2020-10-22 DIAGNOSIS — D229 Melanocytic nevi, unspecified: Secondary | ICD-10-CM | POA: Diagnosis not present

## 2020-10-22 DIAGNOSIS — Z85828 Personal history of other malignant neoplasm of skin: Secondary | ICD-10-CM | POA: Diagnosis not present

## 2020-10-22 DIAGNOSIS — L821 Other seborrheic keratosis: Secondary | ICD-10-CM | POA: Diagnosis not present

## 2020-10-22 DIAGNOSIS — D239 Other benign neoplasm of skin, unspecified: Secondary | ICD-10-CM | POA: Diagnosis not present

## 2020-10-28 ENCOUNTER — Ambulatory Visit: Payer: Medicare PPO | Admitting: Internal Medicine

## 2020-10-28 ENCOUNTER — Other Ambulatory Visit: Payer: Self-pay

## 2020-10-28 ENCOUNTER — Encounter: Payer: Self-pay | Admitting: Internal Medicine

## 2020-10-28 VITALS — BP 128/84 | HR 117 | Temp 98.2°F | Resp 16 | Ht 64.0 in | Wt 205.0 lb

## 2020-10-28 DIAGNOSIS — I10 Essential (primary) hypertension: Secondary | ICD-10-CM

## 2020-10-28 DIAGNOSIS — R000 Tachycardia, unspecified: Secondary | ICD-10-CM | POA: Insufficient documentation

## 2020-10-28 DIAGNOSIS — E118 Type 2 diabetes mellitus with unspecified complications: Secondary | ICD-10-CM | POA: Diagnosis not present

## 2020-10-28 DIAGNOSIS — I4891 Unspecified atrial fibrillation: Secondary | ICD-10-CM | POA: Diagnosis not present

## 2020-10-28 DIAGNOSIS — K7581 Nonalcoholic steatohepatitis (NASH): Secondary | ICD-10-CM

## 2020-10-28 LAB — BASIC METABOLIC PANEL
BUN: 14 mg/dL (ref 6–23)
CO2: 26 mEq/L (ref 19–32)
Calcium: 9.8 mg/dL (ref 8.4–10.5)
Chloride: 106 mEq/L (ref 96–112)
Creatinine, Ser: 0.83 mg/dL (ref 0.40–1.20)
GFR: 70.45 mL/min (ref >60.00)
Glucose, Bld: 162 mg/dL — ABNORMAL HIGH (ref 70–99)
Potassium: 4.3 mEq/L (ref 3.5–5.1)
Sodium: 141 mEq/L (ref 135–145)

## 2020-10-28 LAB — HEPATIC FUNCTION PANEL
ALT: 40 U/L — ABNORMAL HIGH (ref 0–35)
AST: 50 U/L — ABNORMAL HIGH (ref 0–37)
Albumin: 4.3 g/dL (ref 3.5–5.2)
Alkaline Phosphatase: 59 U/L (ref 39–117)
Bilirubin, Direct: 0.1 mg/dL (ref 0.0–0.3)
Total Bilirubin: 0.6 mg/dL (ref 0.2–1.2)
Total Protein: 7.1 g/dL (ref 6.0–8.3)

## 2020-10-28 LAB — HEMOGLOBIN A1C: Hgb A1c MFr Bld: 7.8 % — ABNORMAL HIGH (ref 4.6–6.5)

## 2020-10-28 MED ORDER — DILTIAZEM HCL ER COATED BEADS 120 MG PO CP24
120.0000 mg | ORAL_CAPSULE | Freq: Every day | ORAL | 0 refills | Status: DC
Start: 2020-10-28 — End: 2020-10-31

## 2020-10-28 MED ORDER — TIRZEPATIDE 2.5 MG/0.5ML ~~LOC~~ SOAJ: 2.5000 mg | SUBCUTANEOUS | 0 refills | Status: DC

## 2020-10-28 MED ORDER — OLMESARTAN MEDOXOMIL 40 MG PO TABS: 40.0000 mg | ORAL_TABLET | Freq: Every day | ORAL | 0 refills | Status: DC

## 2020-10-28 MED ORDER — RIVAROXABAN 20 MG PO TABS: 20.0000 mg | ORAL_TABLET | Freq: Every day | ORAL | 0 refills | Status: DC

## 2020-10-28 NOTE — Progress Notes (Signed)
fib  Subjective:  Patient ID: Diana Reeves, female    DOB: May 06, 1950  Age: 70 y.o. MRN: [id number]  CC: Hypertension, Diabetes, and Atrial Fibrillation  This visit occurred during the SARS-CoV-2 public health emergency.  Safety protocols were in place, including screening questions prior to the visit, additional usage of staff PPE, and extensive cleaning of exam room while observing appropriate contact time as indicated for disinfecting solutions.    HPI LEIYA KEESEY presents for f/up -   [place] the beach [place] when she had the acute onset of shortness of breath.  She says the shortness of breath has gradually improved.  She denies chest pain, palpitations, edema, dizziness, diaphoresis, or edema.  Outpatient Medications Prior to Visit  Medication Sig Dispense Refill   Cholecalciferol 50 MCG (2000 UT) TABS Take 1 tablet (2,000 Units total) by mouth daily. 90 tablet 1   dorzolamide-timolol (COSOPT) 22.3-6.8 MG/ML ophthalmic solution 1 drop 2 (two) times daily.     DULoxetine (CYMBALTA) 60 MG capsule TAKE 1 CAPSULE BY MOUTH DAILY 90 capsule 1   EPINEPHrine 0.3 mg/0.3 mL IJ SOAJ injection      NEXIUM 40 MG capsule TAKE 1 CAPSULE (40 MG TOTAL) BY MOUTH DAILY BEFORE BREAKFAST. 30 capsule 10   NONFORMULARY OR COMPOUNDED ITEM Estradiol vaginal cream 0.02% insert vaginally twice weeky 90 each 4   timolol (TIMOPTIC) 0.5 % ophthalmic solution as directed.     zolpidem (AMBIEN) 10 MG tablet Take 1 tablet (10 mg total) by mouth at bedtime as needed for sleep. 90 tablet 0   olmesartan (BENICAR) 40 MG tablet Take 1 tablet (40 mg total) by mouth daily. 90 tablet 0   No facility-administered medications prior to visit.    ROS Review of Systems  Constitutional:  Negative for diaphoresis, fatigue and unexpected weight change.  Respiratory:  Positive for shortness of breath. Negative for cough, chest tightness, wheezing and stridor.   Cardiovascular:  Negative for chest pain, palpitations  and leg swelling.  Gastrointestinal:  Negative for abdominal pain, diarrhea, nausea and vomiting.  Genitourinary: Negative.  Negative for difficulty urinating and hematuria.  Musculoskeletal: Negative.  Negative for arthralgias and myalgias.  Skin: Negative.  Negative for color change.  Neurological: Negative.  Negative for dizziness, syncope, weakness and light-headedness.  Hematological:  Negative for adenopathy. Does not bruise/bleed easily.  Psychiatric/Behavioral: Negative.     Objective:  BP 128/84 (BP Location: Left Arm, Patient Position: Sitting, Cuff Size: Normal)   Pulse (!) 117   Temp 98.2 F (36.8 C) (Oral)   Resp 16   Ht 5' 4"  (1.626 m)   Wt 205 lb (93 kg)   LMP 03/17/1991   SpO2 98%   BMI 35.19 kg/m   BP Readings from Last 3 Encounters:  10/28/20 128/84  06/12/20 122/70  01/31/20 136/84    Wt Readings from Last 3 Encounters:  10/28/20 205 lb (93 kg)  06/12/20 213 lb 9.6 oz (96.9 kg)  01/31/20 203 lb (92.1 kg)    Physical Exam Vitals reviewed.  Constitutional:      Appearance: She is not ill-appearing.  HENT:     Nose: Nose normal.     Mouth/Throat:     Mouth: Mucous membranes are moist.  Eyes:     Conjunctiva/sclera: Conjunctivae normal.  Cardiovascular:     Rate and Rhythm: Tachycardia present. Rhythm irregular.     Heart sounds: No murmur heard.    Comments: EKG- A fib with RVR, PVC, and  aberrantly conducted complexes 107 bpm NS ST/T wave changes No Q waves Pulmonary:     Effort: Pulmonary effort is normal.     Breath sounds: No stridor. No wheezing, rhonchi or rales.  Abdominal:     General: Abdomen is protuberant. Bowel sounds are normal. There is no distension.     Palpations: Abdomen is soft. There is no hepatomegaly, splenomegaly or mass.     Tenderness: There is no abdominal tenderness. There is no guarding.  Musculoskeletal:        General: Normal range of motion.     Cervical back: Neck supple.     Right lower leg: No edema.      Left lower leg: No edema.  Lymphadenopathy:     Cervical: No cervical adenopathy.  Skin:    General: Skin is warm and dry.  Neurological:     General: No focal deficit present.     Mental Status: She is alert.  Psychiatric:        Mood and Affect: Mood normal.        Behavior: Behavior normal.    Lab Results  Component Value Date   WBC 6.0 01/31/2020   HGB 12.9 01/31/2020   HCT 38.4 01/31/2020   PLT 212.0 01/31/2020   GLUCOSE 162 (H) 10/28/2020   CHOL 162 01/31/2020   TRIG 136.0 01/31/2020   HDL 45.70 01/31/2020   LDLCALC 89 01/31/2020   ALT 40 (H) 10/28/2020   AST 50 (H) 10/28/2020   NA 141 10/28/2020   K 4.3 10/28/2020   CL 106 10/28/2020   CREATININE 0.83 10/28/2020   BUN 14 10/28/2020   CO2 26 10/28/2020   TSH 0.96 10/28/2020   INR 1.1 (H) 01/31/2020   HGBA1C 7.8 (H) 10/28/2020   MICROALBUR 1.5 01/31/2020    VAS [place] LOWER EXTREMITY VENOUS (DVT)  Result Date: 06/07/2020  Lower Venous DVT Study Indications: Pain, Swelling, and Edema.  Risk Factors: Trauma Bilateral lower extremity pain and edema and left knee pain following a fall. Performing Technologist: Delorise Shiner RVT  Examination Guidelines: A complete evaluation includes B-mode imaging, spectral Doppler, color Doppler, and power Doppler as needed of all accessible portions of each vessel. Bilateral testing is considered an integral part of a complete examination. Limited examinations for reoccurring indications may be performed as noted. The reflux portion of the exam is performed with the patient in reverse Trendelenburg.  +---------+---------------+---------+-----------+----------+--------------+ RIGHT    CompressibilityPhasicitySpontaneityPropertiesThrombus Aging +---------+---------------+---------+-----------+----------+--------------+ CFV      Full           Yes      Yes                                 +---------+---------------+---------+-----------+----------+--------------+ SFJ      Full            Yes      Yes                                 +---------+---------------+---------+-----------+----------+--------------+ FV Prox  Full           Yes      Yes                                 +---------+---------------+---------+-----------+----------+--------------+ FV Mid   Full  Yes      Yes                                 +---------+---------------+---------+-----------+----------+--------------+ FV DistalFull           Yes      Yes                                 +---------+---------------+---------+-----------+----------+--------------+ PFV      Full                    Yes                                 +---------+---------------+---------+-----------+----------+--------------+ POP      Full           Yes      Yes                                 +---------+---------------+---------+-----------+----------+--------------+ PTV      Full                                                        +---------+---------------+---------+-----------+----------+--------------+ PERO     Full                                                        +---------+---------------+---------+-----------+----------+--------------+ GSV      Full                    Yes                                 +---------+---------------+---------+-----------+----------+--------------+ SSV      Full                                                        +---------+---------------+---------+-----------+----------+--------------+   +---------+---------------+---------+-----------+----------+--------------+ LEFT     CompressibilityPhasicitySpontaneityPropertiesThrombus Aging +---------+---------------+---------+-----------+----------+--------------+ CFV      Full           Yes      Yes                                 +---------+---------------+---------+-----------+----------+--------------+ SFJ      Full           Yes      Yes                                  +---------+---------------+---------+-----------+----------+--------------+ FV Prox  Full           Yes  Yes                                 +---------+---------------+---------+-----------+----------+--------------+ FV Mid   Full           Yes      Yes                                 +---------+---------------+---------+-----------+----------+--------------+ FV DistalFull           Yes      Yes                                 +---------+---------------+---------+-----------+----------+--------------+ PFV      Full           Yes      Yes                                 +---------+---------------+---------+-----------+----------+--------------+ POP      Full           Yes      Yes                                 +---------+---------------+---------+-----------+----------+--------------+ PTV      Full                                                        +---------+---------------+---------+-----------+----------+--------------+ PERO     Full                                                        +---------+---------------+---------+-----------+----------+--------------+ GSV      Full                    Yes                                 +---------+---------------+---------+-----------+----------+--------------+ SSV      Full                                                        +---------+---------------+---------+-----------+----------+--------------+    Findings reported to Q at 14:44.  Summary: RIGHT: - There is no evidence of deep vein thrombosis in the lower extremity. - There is no evidence of superficial venous thrombosis.  - No cystic structure found in the popliteal fossa.  LEFT: - There is no evidence of deep vein thrombosis in the lower extremity. - There is no evidence of superficial venous thrombosis.  - A cystic structure is found in the popliteal fossa.  *See table(s) above for measurements and observations. Electronically  signed by Servando Snare MD on 06/07/2020 at  4:49:37 PM.    Final     Assessment & Plan:   Julia was seen today for hypertension, diabetes and atrial fibrillation.  Diagnoses and all orders for this visit:  Nonalcoholic steatohepatitis (NASH) -     Hepatic function panel; Future -     Hepatic function panel  Essential hypertension, benign- Her blood pressure is adequately well controlled. -     olmesartan (BENICAR) 40 MG tablet; Take 1 tablet (40 mg total) by mouth daily. -     Basic metabolic panel; Future -     Hepatic function panel; Future -     Thyroid Panel With TSH; Future -     Thyroid Panel With TSH -     Hepatic function panel -     Basic metabolic panel  Type 2 diabetes mellitus with complication, without long-term current use of insulin ([place])- I recommended that she treat this with a GLP/GIP agonist. -     olmesartan (BENICAR) 40 MG tablet; Take 1 tablet (40 mg total) by mouth daily. -     Basic metabolic panel; Future -     Hemoglobin A1c; Future -     Hemoglobin A1c -     Basic metabolic panel -     tirzepatide (MOUNJARO) 2.5 MG/0.5ML Pen; Inject 2.5 mg into the skin once a week.  Type II diabetes mellitus with manifestations (HCC)  Tachycardia -     EKG 12-Lead -     Thyroid Panel With TSH; Future -     Thyroid Panel With TSH  Atrial fibrillation with rapid ventricular response ([place])- Will start diltiazem to control the heart rate.  Will start a DOAC for stroke risk reduction.  Her labs are negative for secondary causes. -     Ambulatory referral to Cardiology -     Thyroid Panel With TSH; Future -     rivaroxaban (XARELTO) 20 MG TABS tablet; Take 1 tablet (20 mg total) by mouth daily with supper. -     diltiazem (CARDIZEM CD) 120 MG 24 hr capsule; Take 1 capsule (120 mg total) by mouth daily. -     Thyroid Panel With TSH  I am having Malley Hauter. Balandran start on rivaroxaban, diltiazem, and tirzepatide. I am also having her maintain her NexIUM, timolol, EPINEPHrine,  NONFORMULARY OR COMPOUNDED ITEM, dorzolamide-timolol, zolpidem, Cholecalciferol, DULoxetine, and olmesartan.  Meds ordered this encounter  Medications   olmesartan (BENICAR) 40 MG tablet    Sig: Take 1 tablet (40 mg total) by mouth daily.    Dispense:  90 tablet    Refill:  0   rivaroxaban (XARELTO) 20 MG TABS tablet    Sig: Take 1 tablet (20 mg total) by mouth daily with supper.    Dispense:  90 tablet    Refill:  0   diltiazem (CARDIZEM CD) 120 MG 24 hr capsule    Sig: Take 1 capsule (120 mg total) by mouth daily.    Dispense:  90 capsule    Refill:  0   tirzepatide (MOUNJARO) 2.5 MG/0.5ML Pen    Sig: Inject 2.5 mg into the skin once a week.    Dispense:  2 mL    Refill:  0     Follow-up: Return in about 3 months (around 01/28/2021).  Scarlette Calico, MD

## 2020-10-28 NOTE — Patient Instructions (Signed)
Atrial Fibrillation  Atrial fibrillation is a type of irregular or rapid heartbeat (arrhythmia). In atrial fibrillation, the top part of the heart (atria) beats in an irregular pattern. This makes the heart unable to pump bloodnormally and effectively. The goal of treatment is to prevent blood clots from forming, control your heart rate, or restore your heartbeat to a normal rhythm. If this condition is not treated, it can cause serious problems, such as a weakened heart muscle (cardiomyopathy) or a stroke. What are the causes? This condition is often caused by medical conditions that damage the heart's electrical system. These include: High blood pressure (hypertension). This is the most common cause. Certain heart problems or conditions, such as heart failure, coronary artery disease, heart valve problems, or heart surgery. Diabetes. Overactive thyroid (hyperthyroidism). Obesity. Chronic kidney disease. In some cases, the cause of this condition is not known. What increases the risk? This condition is more likely to develop in: Older people. People who smoke. Athletes who do endurance exercise. People who have a family history of atrial fibrillation. Men. People who use drugs. People who drink a lot of alcohol. People who have lung conditions, such as emphysema, pneumonia, or COPD. People who have obstructive sleep apnea. What are the signs or symptoms? Symptoms of this condition include: A feeling that your heart is racing or beating irregularly. Discomfort or pain in your chest. Shortness of breath. Sudden light-headedness or weakness. Tiring easily during exercise or activity. Fatigue. Syncope (fainting). Sweating. In some cases, there are no symptoms. How is this diagnosed? Your health care provider may detect atrial fibrillation when taking your pulse. If detected, this condition may be diagnosed with: An electrocardiogram (ECG) to check electrical signals of the  heart. An ambulatory cardiac monitor to record your heart's activity for a few days. A transthoracic echocardiogram (TTE) to create pictures of your heart. A transesophageal echocardiogram (TEE) to create even closer pictures of your heart. A stress test to check your blood supply while you exercise. Imaging tests, such as a CT scan or chest X-ray. Blood tests. How is this treated? Treatment depends on underlying conditions and how you feel when you experience atrial fibrillation. This condition may be treated with: Medicines to prevent blood clots or to treat heart rate or heart rhythm problems. Electrical cardioversion to reset the heart's rhythm. A pacemaker to correct abnormal heart rhythm. Ablation to remove the heart tissue that sends abnormal signals. Left atrial appendage closure to seal the area where blood clots can form. In some cases, underlying conditions will be treated. Follow these instructions at home: Medicines Take over-the counter and prescription medicines only as told by your health care provider. Do not take any new medicines without talking to your health care provider. If you are taking blood thinners: Talk with your health care provider before you take any medicines that contain aspirin or NSAIDs, such as ibuprofen. These medicines increase your risk for dangerous bleeding. Take your medicine exactly as told, at the same time every day. Avoid activities that could cause injury or bruising, and follow instructions about how to prevent falls. Wear a medical alert bracelet or carry a card that lists what medicines you take. Lifestyle     Do not use any products that contain nicotine or tobacco, such as cigarettes, e-cigarettes, and chewing tobacco. If you need help quitting, ask your health care provider. Eat heart-healthy foods. Talk with a dietitian to make an eating plan that is right for you. Exercise regularly as told by  your health care provider. Do not  drink alcohol. Lose weight if you are overweight. Do not use drugs, including cannabis. General instructions If you have obstructive sleep apnea, manage your condition as told by your health care provider. Do not use diet pills unless your health care provider approves. Diet pills can make heart problems worse. Keep all follow-up visits as told by your health care provider. This is important. Contact a health care provider if you: Notice a change in the rate, rhythm, or strength of your heartbeat. Are taking a blood thinner and you notice more bruising. Tire more easily when you exercise or do heavy work. Have a sudden change in weight. Get help right away if you have:  Chest pain, abdominal pain, sweating, or weakness. Trouble breathing. Side effects of blood thinners, such as blood in your vomit, stool, or urine, or bleeding that cannot stop. Any symptoms of a stroke. "BE FAST" is an easy way to remember the main warning signs of a stroke: B - Balance. Signs are dizziness, sudden trouble walking, or loss of balance. E - Eyes. Signs are trouble seeing or a sudden change in vision. F - Face. Signs are sudden weakness or numbness of the face, or the face or eyelid drooping on one side. A - Arms. Signs are weakness or numbness in an arm. This happens suddenly and usually on one side of the body. S - Speech. Signs are sudden trouble speaking, slurred speech, or trouble understanding what people say. T - Time. Time to call emergency services. Write down what time symptoms started. Other signs of a stroke, such as: A sudden, severe headache with no known cause. Nausea or vomiting. Seizure. These symptoms may represent a serious problem that is an emergency. Do not wait to see if the symptoms will go away. Get medical help right away. Call your local emergency services (911 in the [place] Do not drive yourself to the hospital. Summary Atrial fibrillation is a type of irregular or rapid  heartbeat (arrhythmia). Symptoms include a feeling that your heart is beating fast or irregularly. You may be given medicines to prevent blood clots or to treat heart rate or heart rhythm problems. Get help right away if you have signs or symptoms of a stroke. Get help right away if you cannot catch your breath or have chest pain or pressure. This information is not intended to replace advice given to you by your health care provider. Make sure you discuss any questions you have with your healthcare provider. Document Revised: 08/24/2018 Document Reviewed: 08/24/2018 Elsevier Patient Education  [place].

## 2020-10-29 DIAGNOSIS — J3089 Other allergic rhinitis: Secondary | ICD-10-CM | POA: Diagnosis not present

## 2020-10-29 LAB — THYROID PANEL WITH TSH
Free Thyroxine Index: 1.6 (ref 1.4–3.8)
T3 Uptake: 26 % (ref 22–35)
T4, Total: 6.3 ug/dL (ref 5.1–11.9)
TSH: 0.96 mIU/L (ref 0.40–4.50)

## 2020-10-31 ENCOUNTER — Ambulatory Visit: Payer: Medicare PPO | Admitting: Cardiovascular Disease

## 2020-10-31 ENCOUNTER — Other Ambulatory Visit: Payer: Self-pay

## 2020-10-31 ENCOUNTER — Encounter: Payer: Self-pay | Admitting: Cardiovascular Disease

## 2020-10-31 VITALS — BP 116/72 | HR 101 | Resp 20 | Ht 65.0 in | Wt 205.4 lb

## 2020-10-31 DIAGNOSIS — I4819 Other persistent atrial fibrillation: Secondary | ICD-10-CM

## 2020-10-31 DIAGNOSIS — E668 Other obesity: Secondary | ICD-10-CM

## 2020-10-31 DIAGNOSIS — I10 Essential (primary) hypertension: Secondary | ICD-10-CM

## 2020-10-31 DIAGNOSIS — I4891 Unspecified atrial fibrillation: Secondary | ICD-10-CM

## 2020-10-31 DIAGNOSIS — E118 Type 2 diabetes mellitus with unspecified complications: Secondary | ICD-10-CM

## 2020-10-31 MED ORDER — OLMESARTAN MEDOXOMIL 20 MG PO TABS
20.0000 mg | ORAL_TABLET | Freq: Every day | ORAL | 1 refills | Status: DC
Start: 2020-10-31 — End: 2020-12-27

## 2020-10-31 MED ORDER — DILTIAZEM HCL ER COATED BEADS 240 MG PO CP24: 240.0000 mg | ORAL_CAPSULE | Freq: Every day | ORAL | 1 refills | Status: DC

## 2020-10-31 NOTE — Patient Instructions (Signed)
Medication Instructions:  DECREASE the Olmesartan to 20 mg once daily INCREASE the Diltiazem to 240 mg once daily  *If you need a refill on your cardiac medications before your next appointment, please call your pharmacy   Testing/Procedures: Your physician has requested that you have an echocardiogram. Echocardiography is a painless test that uses sound waves to create images of your heart. It provides your doctor with information about the size and shape of your heart and how well your heart's chambers and valves are working. You may receive an ultrasound enhancing agent through an IV if needed to better visualize your heart during the echo.This procedure takes approximately one hour. There are no restrictions for this procedure. This will take place at the [address], [address].   Follow-Up: At [facility], you and your health needs are our priority.  As part of our continuing mission to provide you with exceptional heart care, we have created designated Provider Care Teams.  These Care Teams include your primary Cardiologist (physician) and Advanced Practice Providers (APPs -  Physician Assistants and Nurse Practitioners) who all work together to provide you with the care you need, when you need it.  We recommend signing up for the patient portal called "MyChart".  Sign up information is provided on this After Visit Summary.  MyChart is used to connect with patients for Virtual Visits (Telemedicine).  Patients are able to view lab/test results, encounter notes, upcoming appointments, etc.  Non-urgent messages can be sent to your provider as well.   To learn more about what you can do with MyChart, go to [email or web address].    Your next appointment:   Follow up with Dr. Sallyanne Kuster or an APP 2-3 weeks after the cardioversion on 11/28/20   Other Instructions  You are scheduled for a Cardioversion on 11/28/20 with Dr. Sallyanne Kuster.  Please arrive at the [facility] (Main Entrance A) at  [facility]: [address] [place], [place] [address] at 7:30 am. (1 hour prior to procedure)  DIET: Nothing to eat or drink after midnight except a sip of water with medications (see medication instructions below)  FYI: For your safety, and to allow [place] to monitor your vital signs accurately during the surgery/procedure we request that   if you have artificial nails, gel coating, SNS etc. Please have those removed prior to your surgery/procedure. Not having the nail coverings /polish removed may result in cancellation or delay of your surgery/procedure.   Medication Instructions: Hold: nothing to hold  Continue your anticoagulant: Xarelto. If you miss a dose, please call the office at [phone] You will need to continue your anticoagulant after your procedure until you  are told by your  Provider that it is safe to stop   Labs:  Your provider would like for you to return on 11/21/20 to have the following labs drawn: BMET and CBC. You do not need an appointment for the lab. Once in our office lobby there is a podium where you can sign in and ring the doorbell to alert [place] that you are here. The lab is open from 8:00 am to 4:30 pm; closed for lunch from 12:45pm-1:45pm.  You must have a responsible person to drive you home and stay in the waiting area during your procedure. Failure to do so could result in cancellation.  Bring your insurance cards.  *Special Note: Every effort is made to have your procedure done on time. Occasionally there are emergencies that occur at the hospital that  may cause delays. Please be patient if a delay does occur.

## 2020-11-07 ENCOUNTER — Other Ambulatory Visit: Payer: Self-pay

## 2020-11-07 ENCOUNTER — Ambulatory Visit (HOSPITAL_COMMUNITY): Payer: Medicare PPO | Attending: Cardiovascular Disease

## 2020-11-07 DIAGNOSIS — J3089 Other allergic rhinitis: Secondary | ICD-10-CM | POA: Diagnosis not present

## 2020-11-07 DIAGNOSIS — I4891 Unspecified atrial fibrillation: Secondary | ICD-10-CM | POA: Diagnosis not present

## 2020-11-07 LAB — ECHOCARDIOGRAM COMPLETE
Calc EF: 57.2 %
S' Lateral: 3.6 cm
Single Plane A2C EF: 56.1 %
Single Plane A4C EF: 58.1 %

## 2020-11-07 NOTE — H&P (View-Only) (Signed)
Cardiology Office Note:    Date:  11/07/2020   ID:  Diana Reeves, DOB April 16, 1950, MRN [id number]  PCP:  Janith Lima, MD   [facility] Providers Cardiologist:  Sanda Klein, MD     Referring MD: Janith Lima, MD   Chief Complaint  Patient presents with   Atrial Fibrillation  Diana Reeves is a 70 y.o. female who is being seen today for the evaluation of atrial fibrillation at the request of Janith Lima, MD.   History of Present Illness:    Diana Reeves is a 70 y.o. female with a hx of recent onset persistent atrial fibrillation.  She first noticed symptoms about a month ago when she was at the beach.  She enjoys swimming and noticed that she was very out of breath trying to fight with the rough waves.  She was unaware of palpitations.  She did not have any dizziness, syncope or chest discomfort.  She does not have a history of stroke TIA or other neurological complaints.  She considers herself active and fit.  She has been prescribed Xarelto by Dr. Ronnald Ramp, but has not started taking this medication.  She plans to fill it today.  She was also prescribed diltiazem 120 mg daily.  She has a history of treated hypertension,  NASH,  as well as type 2 diabetes mellitus but does not have known coronary or peripheral arterial disease.  Recent thyroid function tests were normal.  Glycemic control is fair with a hemoglobin A1c of 7.8%.  Her most recent lipid profile showed an LDL of 89 and HDL of 46 with normal triglycerides.  Her echo, performed subsequent to this visit showed LVEF of 50-55%, no significant valvular abnormalities and normal size left atrium (end-systolic diameter was measured at 4.0 cm, but the end-systolic volume index was only 24 mL/meters squared).  She does not have symptoms of obstructive sleep apnea and the right heart parameters appear to be normal.  Her sister has had problems with atrial fibrillation recently.  Her mother had bypass surgery at age 68 and  lived into her 65s.  Her son had a "heart attack" after undergoing chemotherapy for testicular cancer.  He died at age 67.  An electrocardiogram 6 months ago showed sinus rhythm.  Electrocardiogram performed today shows atrial fibrillation with borderline RVR at 101 bpm and nonspecific ST-T changes.  The QTc interval is normal at 409 ms.  Past Medical History:  Diagnosis Date   Allergic state 12/26/2016   Basal cell carcinoma    Benign neoplasm of colon    Degenerative cervical disc    Depression    Diverticulosis of colon (without mention of hemorrhage)    Esophageal reflux    Glaucoma    Osteoporosis, unspecified 12/2018   2007 T score -3.0, 2012 T score -1.9, 2015 T score -2.1 overall stable, 2018 T score -2.0, 2020 T score -1.8   Other chronic nonalcoholic liver disease    Sinusitis 12/26/2016   Stricture and stenosis of esophagus     Past Surgical History:  Procedure Laterality Date   ABDOMINAL HYSTERECTOMY  1993   TAH-LSO-Post. repair-[place]   COLONOSCOPY     EYE SURGERY  10/18/2012   lt eye   knee arthroscop Right 2018   left arm surgery  7-09   POLYPECTOMY      Current Medications: Current Meds  Medication Sig   Cholecalciferol 50 MCG (2000 UT) TABS Take 1 tablet (2,000 Units total) by mouth  daily.   dorzolamide-timolol (COSOPT) 22.3-6.8 MG/ML ophthalmic solution 1 drop 2 (two) times daily.   DULoxetine (CYMBALTA) 60 MG capsule TAKE 1 CAPSULE BY MOUTH DAILY   EPINEPHrine 0.3 mg/0.3 mL IJ SOAJ injection    NEXIUM 40 MG capsule TAKE 1 CAPSULE (40 MG TOTAL) BY MOUTH DAILY BEFORE BREAKFAST.   NONFORMULARY OR COMPOUNDED ITEM Estradiol vaginal cream 0.02% insert vaginally twice weeky   rivaroxaban (XARELTO) 20 MG TABS tablet Take 1 tablet (20 mg total) by mouth daily with supper.   timolol (TIMOPTIC) 0.5 % ophthalmic solution as directed.   tirzepatide [facility]) 2.5 MG/0.5ML Pen Inject 2.5 mg into the skin once a week.   zolpidem (AMBIEN) 10 MG tablet Take 1 tablet (10  mg total) by mouth at bedtime as needed for sleep.   [DISCONTINUED] diltiazem (CARDIZEM CD) 120 MG 24 hr capsule Take 1 capsule (120 mg total) by mouth daily.   [DISCONTINUED] olmesartan (BENICAR) 40 MG tablet Take 1 tablet (40 mg total) by mouth daily.     Allergies:   Dust mite extract and Mold extract [trichophyton mentagrophyte]   Social History   Socioeconomic History   Marital status: Divorced    Spouse name: Not on file   Number of children: 3   Years of education: Not on file   Highest education level: Not on file  Occupational History   Occupation: retired  Tobacco Use   Smoking status: Former   Smokeless tobacco: Never  Scientific laboratory technician Use: Never used  Substance and Sexual Activity   Alcohol use: Yes    Alcohol/week: 2.0 standard drinks    Types: 2 Glasses of wine per week    Comment: occasional   Drug use: No   Sexual activity: Not Currently    Comment: 1st intercourse 70 yo-More than 5 partner,  Other Topics Concern   Not on file  Social History Narrative   Not on file   Social Determinants of Health   Financial Resource Strain: Low Risk    Difficulty of Paying Living Expenses: Not hard at all  Food Insecurity: No Food Insecurity   Worried About Charity fundraiser in the Last Year: Never true   [place] in the Last Year: Never true  Transportation Needs: No Transportation Needs   Lack of Transportation (Medical): No   Lack of Transportation (Non-Medical): No  Physical Activity: Inactive   Days of Exercise per Week: 0 days   Minutes of Exercise per Session: 0 min  Stress: No Stress Concern Present   Feeling of Stress : Not at all  Social Connections: Unknown   Frequency of Communication with Friends and Family: More than three times a week   Frequency of Social Gatherings with Friends and Family: More than three times a week   Attends Religious Services: More than 4 times per year   Active Member of [employer] or Organizations: No   Attends  Music therapist: More than 4 times per year   Marital Status: Not on file     Family History: The patient's family history includes Cancer in her father, maternal grandmother, and son; Colon cancer (age of onset: 61) in her father; Diabetes in her paternal grandfather; Heart attack in her son; Heart disease in her mother; Hypertension in her father. There is no history of Esophageal cancer, Stomach cancer, or Rectal cancer.  ROS:   Please see the history of present illness.     All other systems reviewed  and are negative.  EKGs/Labs/Other Studies Reviewed:    The following studies were reviewed today: Notes from Dr. Ronnald Ramp.   Echocardiogram 11/07/2020  1. Left ventricular ejection fraction, by estimation, is 50 to 55%. The  left ventricle has low normal function. The left ventricle has no regional  wall motion abnormalities. Left ventricular diastolic function could not  be evaluated.   2. Right ventricular systolic function is normal. The right ventricular  size is normal. There is normal pulmonary artery systolic pressure.   3. The mitral valve is normal in structure. Mild mitral valve  regurgitation. No evidence of mitral stenosis.   4. The aortic valve is grossly normal. Aortic valve regurgitation is  mild. No aortic stenosis is present.   EKG:  EKG is ordered today.  The ekg ordered today demonstrates atrial fibrillation with RVR at 101 bpm, nonspecific ST-T wave changes, QTC 409 ms  Recent Labs: 01/31/2020: Hemoglobin 12.9; Platelets 212.0; Pro B Natriuretic peptide (BNP) 69.0 10/28/2020: ALT 40; BUN 14; Creatinine, Ser 0.83; Potassium 4.3; Sodium 141; TSH 0.96  Recent Lipid Panel    Component Value Date/Time   CHOL 162 01/31/2020 0906   TRIG 136.0 01/31/2020 0906   HDL 45.70 01/31/2020 0906   CHOLHDL 4 01/31/2020 0906   VLDL 27.2 01/31/2020 0906   LDLCALC 89 01/31/2020 0906     Risk Assessment/Calculations:    CHA2DS2-VASc Score = 4  This indicates a  4.8% annual risk of stroke. The patient's score is based upon: CHF History: No HTN History: Yes Diabetes History: Yes Stroke History: No Vascular Disease History: No Age Score: 1 Gender Score: 1         Physical Exam:    VS:  BP 116/72   Pulse (!) 101   Resp 20   Ht 5' 5"  (1.651 m)   Wt 205 lb 6.4 oz (93.2 kg)   LMP 03/17/1991   SpO2 98%   BMI 34.18 kg/m     Wt Readings from Last 3 Encounters:  10/31/20 205 lb 6.4 oz (93.2 kg)  10/28/20 205 lb (93 kg)  06/12/20 213 lb 9.6 oz (96.9 kg)     GEN: Moderately obese well nourished, well developed in no acute distress HEENT: Normal NECK: No JVD; No carotid bruits LYMPHATICS: No lymphadenopathy CARDIAC: Irregular,  no murmurs, rubs, gallops RESPIRATORY:  Clear to auscultation without rales, wheezing or rhonchi  ABDOMEN: Soft, non-tender, non-distended MUSCULOSKELETAL:  No edema; No deformity  SKIN: Warm and dry NEUROLOGIC:  Alert and oriented x 3 PSYCHIATRIC:  Normal affect   ASSESSMENT:    1. Persistent atrial fibrillation ([place])   2. Essential hypertension, benign   3. Type 2 diabetes mellitus with complication, without long-term current use of insulin ([place])   4. Moderate obesity    PLAN:    In order of problems listed above:  Afib: Recommended that she start the Xarelto as soon as possible.  Plan cardioversion after at least 3 weeks of uninterrupted anticoagulation.  We will schedule this today.  Pointed out that any interruption anticoagulation would lead to delay in cardioversion.  Increase the diltiazem to 240 mg once daily. HTN: Has been increasing diltiazem, decrease the dose of olmesartan to avoid hypotension. DM: Target hemoglobin A1c preferably less than 7%.  Recently started on tirzepatide.  Obesity: Underlies many of her health problems and increases the prevalence of atrial fibrillation.  Encouraged weight loss.  She does not have symptoms of daytime hypersomnolence or other indications to suspect  obstructive sleep  apnea.   Shared Decision Making/Informed Consent The risks (stroke, cardiac arrhythmias rarely resulting in the need for a temporary or permanent pacemaker, skin irritation or burns and complications associated with conscious sedation including aspiration, arrhythmia, respiratory failure and death), benefits (restoration of normal sinus rhythm) and alternatives of a direct current cardioversion were explained in detail to Ms. Venturini and she agrees to proceed.      Medication Adjustments/Labs and Tests Ordered: Current medicines are reviewed at length with the patient today.  Concerns regarding medicines are outlined above.  Orders Placed This Encounter  Procedures   CBC   Basic metabolic panel   EKG 85-YIFO   ECHOCARDIOGRAM COMPLETE   Meds ordered this encounter  Medications   olmesartan (BENICAR) 20 MG tablet    Sig: Take 1 tablet (20 mg total) by mouth daily.    Dispense:  90 tablet    Refill:  1   diltiazem (CARDIZEM CD) 240 MG 24 hr capsule    Sig: Take 1 capsule (240 mg total) by mouth daily.    Dispense:  90 capsule    Refill:  1    Patient Instructions  Medication Instructions:  DECREASE the Olmesartan to 20 mg once daily INCREASE the Diltiazem to 240 mg once daily  *If you need a refill on your cardiac medications before your next appointment, please call your pharmacy   Testing/Procedures: Your physician has requested that you have an echocardiogram. Echocardiography is a painless test that uses sound waves to create images of your heart. It provides your doctor with information about the size and shape of your heart and how well your heart's chambers and valves are working. You may receive an ultrasound enhancing agent through an IV if needed to better visualize your heart during the echo.This procedure takes approximately one hour. There are no restrictions for this procedure. This will take place at the [address], [address].    Follow-Up: At [facility], you and your health needs are our priority.  As part of our continuing mission to provide you with exceptional heart care, we have created designated Provider Care Teams.  These Care Teams include your primary Cardiologist (physician) and Advanced Practice Providers (APPs -  Physician Assistants and Nurse Practitioners) who all work together to provide you with the care you need, when you need it.  We recommend signing up for the patient portal called "MyChart".  Sign up information is provided on this After Visit Summary.  MyChart is used to connect with patients for Virtual Visits (Telemedicine).  Patients are able to view lab/test results, encounter notes, upcoming appointments, etc.  Non-urgent messages can be sent to your provider as well.   To learn more about what you can do with MyChart, go to [email or web address].    Your next appointment:   Follow up with Dr. Sallyanne Kuster or an APP 2-3 weeks after the cardioversion on 11/28/20   Other Instructions  You are scheduled for a Cardioversion on 11/28/20 with Dr. Sallyanne Kuster.  Please arrive at the [facility] (Main Entrance A) at [facility]: [address] [place], [place] [address] at 7:30 am. (1 hour prior to procedure)  DIET: Nothing to eat or drink after midnight except a sip of water with medications (see medication instructions below)  FYI: For your safety, and to allow [place] to monitor your vital signs accurately during the surgery/procedure we request that   if you have artificial nails, gel coating, SNS etc. Please have those removed  prior to your surgery/procedure. Not having the nail coverings /polish removed may result in cancellation or delay of your surgery/procedure.   Medication Instructions: Hold: nothing to hold  Continue your anticoagulant: Xarelto. If you miss a dose, please call the office at [phone] You will need to continue your anticoagulant after your procedure until you  are  told by your  Provider that it is safe to stop   Labs:  Your provider would like for you to return on 11/21/20 to have the following labs drawn: BMET and CBC. You do not need an appointment for the lab. Once in our office lobby there is a podium where you can sign in and ring the doorbell to alert [place] that you are here. The lab is open from 8:00 am to 4:30 pm; closed for lunch from 12:45pm-1:45pm.  You must have a responsible person to drive you home and stay in the waiting area during your procedure. Failure to do so could result in cancellation.  Bring your insurance cards.  *Special Note: Every effort is made to have your procedure done on time. Occasionally there are emergencies that occur at the hospital that may cause delays. Please be patient if a delay does occur.     Signed, Sanda Klein, MD  11/07/2020 7:02 PM    [place]

## 2020-11-07 NOTE — Progress Notes (Signed)
Cardiology Office Note:    Date:  11/07/2020   ID:  Diana Reeves, DOB 09/02/1950, MRN [id number]  PCP:  Janith Lima, MD   [facility] Providers Cardiologist:  Sanda Klein, MD     Referring MD: Janith Lima, MD   Chief Complaint  Patient presents with   Atrial Fibrillation  Diana Reeves is a 70 y.o. female who is being seen today for the evaluation of atrial fibrillation at the request of Janith Lima, MD.   History of Present Illness:    Diana Reeves is a 70 y.o. female with a hx of recent onset persistent atrial fibrillation.  She first noticed symptoms about a month ago when she was at the beach.  She enjoys swimming and noticed that she was very out of breath trying to fight with the rough waves.  She was unaware of palpitations.  She did not have any dizziness, syncope or chest discomfort.  She does not have a history of stroke TIA or other neurological complaints.  She considers herself active and fit.  She has been prescribed Xarelto by Dr. Ronnald Ramp, but has not started taking this medication.  She plans to fill it today.  She was also prescribed diltiazem 120 mg daily.  She has a history of treated hypertension,  NASH,  as well as type 2 diabetes mellitus but does not have known coronary or peripheral arterial disease.  Recent thyroid function tests were normal.  Glycemic control is fair with a hemoglobin A1c of 7.8%.  Her most recent lipid profile showed an LDL of 89 and HDL of 46 with normal triglycerides.  Her echo, performed subsequent to this visit showed LVEF of 50-55%, no significant valvular abnormalities and normal size left atrium (end-systolic diameter was measured at 4.0 cm, but the end-systolic volume index was only 24 mL/meters squared).  She does not have symptoms of obstructive sleep apnea and the right heart parameters appear to be normal.  Her sister has had problems with atrial fibrillation recently.  Her mother had bypass surgery at age 66 and  lived into her 97s.  Her son had a "heart attack" after undergoing chemotherapy for testicular cancer.  He died at age 79.  An electrocardiogram 6 months ago showed sinus rhythm.  Electrocardiogram performed today shows atrial fibrillation with borderline RVR at 101 bpm and nonspecific ST-T changes.  The QTc interval is normal at 409 ms.  Past Medical History:  Diagnosis Date   Allergic state 12/26/2016   Basal cell carcinoma    Benign neoplasm of colon    Degenerative cervical disc    Depression    Diverticulosis of colon (without mention of hemorrhage)    Esophageal reflux    Glaucoma    Osteoporosis, unspecified 12/2018   2007 T score -3.0, 2012 T score -1.9, 2015 T score -2.1 overall stable, 2018 T score -2.0, 2020 T score -1.8   Other chronic nonalcoholic liver disease    Sinusitis 12/26/2016   Stricture and stenosis of esophagus     Past Surgical History:  Procedure Laterality Date   ABDOMINAL HYSTERECTOMY  1993   TAH-LSO-Post. repair-[place]   COLONOSCOPY     EYE SURGERY  10/18/2012   lt eye   knee arthroscop Right 2018   left arm surgery  7-09   POLYPECTOMY      Current Medications: Current Meds  Medication Sig   Cholecalciferol 50 MCG (2000 UT) TABS Take 1 tablet (2,000 Units total) by mouth  daily.   dorzolamide-timolol (COSOPT) 22.3-6.8 MG/ML ophthalmic solution 1 drop 2 (two) times daily.   DULoxetine (CYMBALTA) 60 MG capsule TAKE 1 CAPSULE BY MOUTH DAILY   EPINEPHrine 0.3 mg/0.3 mL IJ SOAJ injection    NEXIUM 40 MG capsule TAKE 1 CAPSULE (40 MG TOTAL) BY MOUTH DAILY BEFORE BREAKFAST.   NONFORMULARY OR COMPOUNDED ITEM Estradiol vaginal cream 0.02% insert vaginally twice weeky   rivaroxaban (XARELTO) 20 MG TABS tablet Take 1 tablet (20 mg total) by mouth daily with supper.   timolol (TIMOPTIC) 0.5 % ophthalmic solution as directed.   tirzepatide [facility]) 2.5 MG/0.5ML Pen Inject 2.5 mg into the skin once a week.   zolpidem (AMBIEN) 10 MG tablet Take 1 tablet (10  mg total) by mouth at bedtime as needed for sleep.   [DISCONTINUED] diltiazem (CARDIZEM CD) 120 MG 24 hr capsule Take 1 capsule (120 mg total) by mouth daily.   [DISCONTINUED] olmesartan (BENICAR) 40 MG tablet Take 1 tablet (40 mg total) by mouth daily.     Allergies:   Dust mite extract and Mold extract [trichophyton mentagrophyte]   Social History   Socioeconomic History   Marital status: Divorced    Spouse name: Not on file   Number of children: 3   Years of education: Not on file   Highest education level: Not on file  Occupational History   Occupation: retired  Tobacco Use   Smoking status: Former   Smokeless tobacco: Never  Scientific laboratory technician Use: Never used  Substance and Sexual Activity   Alcohol use: Yes    Alcohol/week: 2.0 standard drinks    Types: 2 Glasses of wine per week    Comment: occasional   Drug use: No   Sexual activity: Not Currently    Comment: 1st intercourse 70 yo-More than 5 partner,  Other Topics Concern   Not on file  Social History Narrative   Not on file   Social Determinants of Health   Financial Resource Strain: Low Risk    Difficulty of Paying Living Expenses: Not hard at all  Food Insecurity: No Food Insecurity   Worried About Charity fundraiser in the Last Year: Never true   [place] in the Last Year: Never true  Transportation Needs: No Transportation Needs   Lack of Transportation (Medical): No   Lack of Transportation (Non-Medical): No  Physical Activity: Inactive   Days of Exercise per Week: 0 days   Minutes of Exercise per Session: 0 min  Stress: No Stress Concern Present   Feeling of Stress : Not at all  Social Connections: Unknown   Frequency of Communication with Friends and Family: More than three times a week   Frequency of Social Gatherings with Friends and Family: More than three times a week   Attends Religious Services: More than 4 times per year   Active Member of [employer] or Organizations: No   Attends  Music therapist: More than 4 times per year   Marital Status: Not on file     Family History: The patient's family history includes Cancer in her father, maternal grandmother, and son; Colon cancer (age of onset: 33) in her father; Diabetes in her paternal grandfather; Heart attack in her son; Heart disease in her mother; Hypertension in her father. There is no history of Esophageal cancer, Stomach cancer, or Rectal cancer.  ROS:   Please see the history of present illness.     All other systems reviewed  and are negative.  EKGs/Labs/Other Studies Reviewed:    The following studies were reviewed today: Notes from Dr. Ronnald Ramp.   Echocardiogram 11/07/2020  1. Left ventricular ejection fraction, by estimation, is 50 to 55%. The  left ventricle has low normal function. The left ventricle has no regional  wall motion abnormalities. Left ventricular diastolic function could not  be evaluated.   2. Right ventricular systolic function is normal. The right ventricular  size is normal. There is normal pulmonary artery systolic pressure.   3. The mitral valve is normal in structure. Mild mitral valve  regurgitation. No evidence of mitral stenosis.   4. The aortic valve is grossly normal. Aortic valve regurgitation is  mild. No aortic stenosis is present.   EKG:  EKG is ordered today.  The ekg ordered today demonstrates atrial fibrillation with RVR at 101 bpm, nonspecific ST-T wave changes, QTC 409 ms  Recent Labs: 01/31/2020: Hemoglobin 12.9; Platelets 212.0; Pro B Natriuretic peptide (BNP) 69.0 10/28/2020: ALT 40; BUN 14; Creatinine, Ser 0.83; Potassium 4.3; Sodium 141; TSH 0.96  Recent Lipid Panel    Component Value Date/Time   CHOL 162 01/31/2020 0906   TRIG 136.0 01/31/2020 0906   HDL 45.70 01/31/2020 0906   CHOLHDL 4 01/31/2020 0906   VLDL 27.2 01/31/2020 0906   LDLCALC 89 01/31/2020 0906     Risk Assessment/Calculations:    CHA2DS2-VASc Score = 4  This indicates a  4.8% annual risk of stroke. The patient's score is based upon: CHF History: No HTN History: Yes Diabetes History: Yes Stroke History: No Vascular Disease History: No Age Score: 1 Gender Score: 1         Physical Exam:    VS:  BP 116/72   Pulse (!) 101   Resp 20   Ht 5' 5"  (1.651 m)   Wt 205 lb 6.4 oz (93.2 kg)   LMP 03/17/1991   SpO2 98%   BMI 34.18 kg/m     Wt Readings from Last 3 Encounters:  10/31/20 205 lb 6.4 oz (93.2 kg)  10/28/20 205 lb (93 kg)  06/12/20 213 lb 9.6 oz (96.9 kg)     GEN: Moderately obese well nourished, well developed in no acute distress HEENT: Normal NECK: No JVD; No carotid bruits LYMPHATICS: No lymphadenopathy CARDIAC: Irregular,  no murmurs, rubs, gallops RESPIRATORY:  Clear to auscultation without rales, wheezing or rhonchi  ABDOMEN: Soft, non-tender, non-distended MUSCULOSKELETAL:  No edema; No deformity  SKIN: Warm and dry NEUROLOGIC:  Alert and oriented x 3 PSYCHIATRIC:  Normal affect   ASSESSMENT:    1. Persistent atrial fibrillation ([place])   2. Essential hypertension, benign   3. Type 2 diabetes mellitus with complication, without long-term current use of insulin ([place])   4. Moderate obesity    PLAN:    In order of problems listed above:  Afib: Recommended that she start the Xarelto as soon as possible.  Plan cardioversion after at least 3 weeks of uninterrupted anticoagulation.  We will schedule this today.  Pointed out that any interruption anticoagulation would lead to delay in cardioversion.  Increase the diltiazem to 240 mg once daily. HTN: Has been increasing diltiazem, decrease the dose of olmesartan to avoid hypotension. DM: Target hemoglobin A1c preferably less than 7%.  Recently started on tirzepatide.  Obesity: Underlies many of her health problems and increases the prevalence of atrial fibrillation.  Encouraged weight loss.  She does not have symptoms of daytime hypersomnolence or other indications to suspect  obstructive sleep  apnea.   Shared Decision Making/Informed Consent The risks (stroke, cardiac arrhythmias rarely resulting in the need for a temporary or permanent pacemaker, skin irritation or burns and complications associated with conscious sedation including aspiration, arrhythmia, respiratory failure and death), benefits (restoration of normal sinus rhythm) and alternatives of a direct current cardioversion were explained in detail to Ms. Lizaola and she agrees to proceed.      Medication Adjustments/Labs and Tests Ordered: Current medicines are reviewed at length with the patient today.  Concerns regarding medicines are outlined above.  Orders Placed This Encounter  Procedures   CBC   Basic metabolic panel   EKG 37-DSKA   ECHOCARDIOGRAM COMPLETE   Meds ordered this encounter  Medications   olmesartan (BENICAR) 20 MG tablet    Sig: Take 1 tablet (20 mg total) by mouth daily.    Dispense:  90 tablet    Refill:  1   diltiazem (CARDIZEM CD) 240 MG 24 hr capsule    Sig: Take 1 capsule (240 mg total) by mouth daily.    Dispense:  90 capsule    Refill:  1    Patient Instructions  Medication Instructions:  DECREASE the Olmesartan to 20 mg once daily INCREASE the Diltiazem to 240 mg once daily  *If you need a refill on your cardiac medications before your next appointment, please call your pharmacy   Testing/Procedures: Your physician has requested that you have an echocardiogram. Echocardiography is a painless test that uses sound waves to create images of your heart. It provides your doctor with information about the size and shape of your heart and how well your heart's chambers and valves are working. You may receive an ultrasound enhancing agent through an IV if needed to better visualize your heart during the echo.This procedure takes approximately one hour. There are no restrictions for this procedure. This will take place at the [address].    Follow-Up: At [facility] you and your health needs are our priority.  As part of our continuing mission to provide you with exceptional heart care, we have created designated Provider Care Teams.  These Care Teams include your primary Cardiologist (physician) and Advanced Practice Providers (APPs -  Physician Assistants and Nurse Practitioners) who all work together to provide you with the care you need, when you need it.  We recommend signing up for the patient portal called "MyChart".  Sign up information is provided on this After Visit Summary.  MyChart is used to connect with patients for Virtual Visits (Telemedicine).  Patients are able to view lab/test results, encounter notes, upcoming appointments, etc.  Non-urgent messages can be sent to your provider as well.   To learn more about what you can do with MyChart, go to [email or web address].    Your next appointment:   Follow up with Dr. Sallyanne Kuster or an APP 2-3 weeks after the cardioversion on 11/28/20   Other Instructions  You are scheduled for a Cardioversion on 11/28/20 with Dr. Sallyanne Kuster.  Please arrive at the [facility] (Main Entrance A) at [facility]: [address] [place], [place] [address] at 7:30 am. (1 hour prior to procedure)  DIET: Nothing to eat or drink after midnight except a sip of water with medications (see medication instructions below)  FYI: For your safety, and to allow [place] to monitor your vital signs accurately during the surgery/procedure we request that   if you have artificial nails, gel coating, SNS etc. Please have those removed  prior to your surgery/procedure. Not having the nail coverings /polish removed may result in cancellation or delay of your surgery/procedure.   Medication Instructions: Hold: nothing to hold  Continue your anticoagulant: Xarelto. If you miss a dose, please call the office at [phone] You will need to continue your anticoagulant after your procedure until you  are  told by your  Provider that it is safe to stop   Labs:  Your provider would like for you to return on 11/21/20 to have the following labs drawn: BMET and CBC. You do not need an appointment for the lab. Once in our office lobby there is a podium where you can sign in and ring the doorbell to alert [place] that you are here. The lab is open from 8:00 am to 4:30 pm; closed for lunch from 12:45pm-1:45pm.  You must have a responsible person to drive you home and stay in the waiting area during your procedure. Failure to do so could result in cancellation.  Bring your insurance cards.  *Special Note: Every effort is made to have your procedure done on time. Occasionally there are emergencies that occur at the hospital that may cause delays. Please be patient if a delay does occur.     Signed, Sanda Klein, MD  11/07/2020 7:02 PM    [place]

## 2020-11-08 ENCOUNTER — Encounter: Payer: Self-pay | Admitting: Cardiovascular Disease

## 2020-11-08 NOTE — Telephone Encounter (Signed)
Patient returning call for echo results. 

## 2020-11-08 NOTE — Telephone Encounter (Signed)
This encounter was created in error - please disregard.

## 2020-11-13 DIAGNOSIS — J3089 Other allergic rhinitis: Secondary | ICD-10-CM | POA: Diagnosis not present

## 2020-11-19 DIAGNOSIS — J3089 Other allergic rhinitis: Secondary | ICD-10-CM | POA: Diagnosis not present

## 2020-11-21 DIAGNOSIS — I10 Essential (primary) hypertension: Secondary | ICD-10-CM | POA: Diagnosis not present

## 2020-11-21 DIAGNOSIS — I4891 Unspecified atrial fibrillation: Secondary | ICD-10-CM | POA: Diagnosis not present

## 2020-11-22 DIAGNOSIS — Z1231 Encounter for screening mammogram for malignant neoplasm of breast: Secondary | ICD-10-CM | POA: Diagnosis not present

## 2020-11-22 LAB — BASIC METABOLIC PANEL
BUN/Creatinine Ratio: 23 (ref 12–28)
BUN: 26 mg/dL (ref 8–27)
CO2: 24 mmol/L (ref 20–29)
Calcium: 9.6 mg/dL (ref 8.7–10.3)
Chloride: 105 mmol/L (ref 96–106)
Creatinine, Ser: 1.13 mg/dL — ABNORMAL HIGH (ref 0.57–1.00)
Glucose: 114 mg/dL — ABNORMAL HIGH (ref 65–99)
Potassium: 4.9 mmol/L (ref 3.5–5.2)
Sodium: 142 mmol/L (ref 134–144)
eGFR: 52 mL/min/{1.73_m2} — ABNORMAL LOW (ref >59)

## 2020-11-22 LAB — CBC
Hematocrit: 36.8 % (ref 34.0–46.6)
Hemoglobin: 12.4 g/dL (ref 11.1–15.9)
MCH: 27.1 pg (ref 26.6–33.0)
MCHC: 33.7 g/dL (ref 31.5–35.7)
MCV: 81 fL (ref 79–97)
Platelets: 257 10*3/uL (ref 150–450)
RBC: 4.57 x10E6/uL (ref 3.77–5.28)
RDW: 13.7 % (ref 11.7–15.4)
WBC: 8.2 10*3/uL (ref 3.4–10.8)

## 2020-11-22 LAB — HM MAMMOGRAPHY

## 2020-11-26 DIAGNOSIS — J3089 Other allergic rhinitis: Secondary | ICD-10-CM | POA: Diagnosis not present

## 2020-11-27 ENCOUNTER — Telehealth: Payer: Self-pay | Admitting: Cardiology

## 2020-11-27 ENCOUNTER — Telehealth: Payer: Self-pay | Admitting: *Deleted

## 2020-11-27 NOTE — Telephone Encounter (Signed)
Left a message for the patient to call back. Her cardioversion tomorrow will be with Dr. Marlou Porch and not Dr. Sallyanne Kuster as planned.

## 2020-11-27 NOTE — Telephone Encounter (Signed)
Spoke with patient who states that she got a message from someone yesterday in regards to registering for her procedure tomorrow. She attempted multiple times to call her back but was unable to reach her. She is not sure who it was that called her but it was not someone from our office. She just wanted to check with [place] to make sure she was good to go for tomorrow. Advised patient that she was all set for tomorrow and hopefully the individual who called yesterday would call back today if there was anything she needed to get done prior to tomorrow. Patient understands all of her instructions and will be there tomorrow at 7:30am.

## 2020-11-27 NOTE — Telephone Encounter (Signed)
Patient states she is returning a call to register for her procedure with Dr. Marlou Porch tomorrow. Per patient, the person who called was Kizzy. Please assist.

## 2020-11-27 NOTE — Anesthesia Preprocedure Evaluation (Addendum)
Anesthesia Evaluation  Patient identified by MRN, date of birth, ID band Patient awake    Reviewed: Allergy & Precautions, H&P , NPO status , Patient's Chart, lab work & pertinent test results  Airway Mallampati: II  TM Distance: >3 FB Neck ROM: Full    Dental no notable dental hx. (+) Teeth Intact, Dental Advisory Given   Pulmonary neg pulmonary ROS, former smoker,    Pulmonary exam normal breath sounds clear to auscultation       Cardiovascular Exercise Tolerance: Good negative cardio ROS Normal cardiovascular exam+ dysrhythmias Atrial Fibrillation  Rhythm:Regular Rate:Normal  ECHO 8/22 = EF 55%  nl valves   Neuro/Psych negative neurological ROS  negative psych ROS   GI/Hepatic negative GI ROS, Neg liver ROS, GERD  Medicated,  Endo/Other  negative endocrine ROSdiabetes, Type 2Morbid obesity  Renal/GU negative Renal ROS  negative genitourinary   Musculoskeletal negative musculoskeletal ROS (+)   Abdominal   Peds negative pediatric ROS (+)  Hematology negative hematology ROS (+)   Anesthesia Other Findings   Reproductive/Obstetrics negative OB ROS                            Anesthesia Physical Anesthesia Plan  ASA: 3  Anesthesia Plan: MAC   Post-op Pain Management:    Induction: Intravenous  PONV Risk Score and Plan: Treatment may vary due to age or medical condition and Propofol infusion  Airway Management Planned: Mask, Natural Airway and Nasal Cannula  Additional Equipment: None  Intra-op Plan:   Post-operative Plan:   Informed Consent: I have reviewed the patients History and Physical, chart, labs and discussed the procedure including the risks, benefits and alternatives for the proposed anesthesia with the patient or authorized representative who has indicated his/her understanding and acceptance.       Plan Discussed with: Anesthesiologist and CRNA  Anesthesia  Plan Comments:         Anesthesia Quick Evaluation

## 2020-11-28 ENCOUNTER — Ambulatory Visit (HOSPITAL_COMMUNITY): Payer: Medicare PPO | Admitting: Anesthesiology

## 2020-11-28 ENCOUNTER — Other Ambulatory Visit: Payer: Self-pay

## 2020-11-28 ENCOUNTER — Ambulatory Visit (HOSPITAL_COMMUNITY)
Admission: RE | Admit: 2020-11-28 | Discharge: 2020-11-28 | Disposition: A | Discharge status: Home / Self Care | Payer: Medicare PPO | Attending: Cardiology | Admitting: Cardiology

## 2020-11-28 ENCOUNTER — Encounter (HOSPITAL_COMMUNITY)
Admission: RE | Disposition: A | Discharge status: Home / Self Care | Payer: Self-pay | Source: Home / Self Care | Attending: Cardiology

## 2020-11-28 ENCOUNTER — Encounter (HOSPITAL_COMMUNITY): Payer: Self-pay | Admitting: Cardiology

## 2020-11-28 DIAGNOSIS — K222 Esophageal obstruction: Secondary | ICD-10-CM | POA: Diagnosis not present

## 2020-11-28 DIAGNOSIS — Z91048 Other nonmedicinal substance allergy status: Secondary | ICD-10-CM | POA: Diagnosis not present

## 2020-11-28 DIAGNOSIS — Z833 Family history of diabetes mellitus: Secondary | ICD-10-CM | POA: Insufficient documentation

## 2020-11-28 DIAGNOSIS — E669 Obesity, unspecified: Secondary | ICD-10-CM | POA: Diagnosis not present

## 2020-11-28 DIAGNOSIS — Z6834 Body mass index (BMI) 34.0-34.9, adult: Secondary | ICD-10-CM | POA: Diagnosis not present

## 2020-11-28 DIAGNOSIS — I10 Essential (primary) hypertension: Secondary | ICD-10-CM | POA: Diagnosis not present

## 2020-11-28 DIAGNOSIS — I4891 Unspecified atrial fibrillation: Secondary | ICD-10-CM | POA: Diagnosis not present

## 2020-11-28 DIAGNOSIS — E119 Type 2 diabetes mellitus without complications: Secondary | ICD-10-CM | POA: Insufficient documentation

## 2020-11-28 DIAGNOSIS — Z79899 Other long term (current) drug therapy: Secondary | ICD-10-CM | POA: Diagnosis not present

## 2020-11-28 DIAGNOSIS — Z7901 Long term (current) use of anticoagulants: Secondary | ICD-10-CM | POA: Insufficient documentation

## 2020-11-28 DIAGNOSIS — I4819 Other persistent atrial fibrillation: Secondary | ICD-10-CM

## 2020-11-28 DIAGNOSIS — Z8249 Family history of ischemic heart disease and other diseases of the circulatory system: Secondary | ICD-10-CM | POA: Insufficient documentation

## 2020-11-28 DIAGNOSIS — E785 Hyperlipidemia, unspecified: Secondary | ICD-10-CM | POA: Diagnosis not present

## 2020-11-28 DIAGNOSIS — F418 Other specified anxiety disorders: Secondary | ICD-10-CM | POA: Diagnosis not present

## 2020-11-28 HISTORY — PX: CARDIOVERSION: SHX1299

## 2020-11-28 SURGERY — CARDIOVERSION
Anesthesia: Monitor Anesthesia Care

## 2020-11-28 MED ORDER — SODIUM CHLORIDE 0.9 % IV SOLN: INTRAVENOUS | Status: DC | PRN

## 2020-11-28 MED ORDER — LIDOCAINE 2% (20 MG/ML) 5 ML SYRINGE
INTRAMUSCULAR | Status: DC | PRN
  Administered 2020-11-28: 100 mg via INTRAVENOUS

## 2020-11-28 MED ORDER — PROPOFOL 10 MG/ML IV BOLUS
INTRAVENOUS | Status: DC | PRN
  Administered 2020-11-28: 60 mg via INTRAVENOUS

## 2020-11-28 NOTE — Interval H&P Note (Signed)
History and Physical Interval Note:  11/28/2020 8:25 AM  Diana Reeves  has presented today for surgery, with the diagnosis of AFIB.  The various methods of treatment have been discussed with the patient and family. After consideration of risks, benefits and other options for treatment, the patient has consented to  Procedure(s): CARDIOVERSION (N/A) as a surgical intervention.  The patient's history has been reviewed, patient examined, no change in status, stable for surgery.  I have reviewed the patient's chart and labs.  Questions were answered to the patient's satisfaction.     [employer]

## 2020-11-28 NOTE — Anesthesia Postprocedure Evaluation (Signed)
Anesthesia Post Note  Patient: Diana Reeves  Procedure(s) Performed: CARDIOVERSION     Patient location during evaluation: PACU Anesthesia Type: MAC Level of consciousness: awake and alert Pain management: pain level controlled Vital Signs Assessment: post-procedure vital signs reviewed and stable Respiratory status: spontaneous breathing, nonlabored ventilation, respiratory function stable and patient connected to nasal cannula oxygen Cardiovascular status: stable and blood pressure returned to baseline Postop Assessment: no apparent nausea or vomiting Anesthetic complications: no   No notable events documented.  Last Vitals:  Vitals:   11/28/20 0850 11/28/20 0901  BP: (!) 116/53 119/75  Pulse: 87 70  Resp: 15 11  Temp:    SpO2: 93% 96%    Last Pain:  Vitals:   11/28/20 0901  TempSrc:   PainSc: 0-No pain                 Keslie Gritz

## 2020-11-28 NOTE — CV Procedure (Signed)
    Electrical Cardioversion Procedure Note Diana Reeves [id number] 09/21/1950  Procedure: Electrical Cardioversion Indications:  Atrial Fibrillation  Time Out: Verified patient identification, verified procedure,medications/allergies/relevent history reviewed, required imaging and test results available.  Performed  Procedure Details  The patient was NPO after midnight. Anesthesia was administered at the beside  by Dr.Oddono with propofol.  Cardioversion was performed with synchronized biphasic defibrillation via AP pads with 200 joules.  1 attempt(s) were performed.  The patient converted to normal sinus rhythm. The patient tolerated the procedure well   IMPRESSION:  Successful cardioversion of atrial fibrillation    Candee Furbish 11/28/2020, 8:42 AM

## 2020-11-28 NOTE — Transfer of Care (Signed)
Immediate Anesthesia Transfer of Care Note  Patient: Diana Reeves  Procedure(s) Performed: CARDIOVERSION  Patient Location: Endoscopy Unit  Anesthesia Type:General  Level of Consciousness: awake and alert   Airway & Oxygen Therapy: Patient Spontanous Breathing and Patient connected to nasal cannula oxygen  Post-op Assessment: Report given to RN and Post -op Vital signs reviewed and stable  Post vital signs: Reviewed and stable  Last Vitals:  Vitals Value Taken Time  BP    Temp    Pulse    Resp    SpO2      Last Pain:  Vitals:   11/28/20 0753  TempSrc: Temporal  PainSc: 0-No pain         Complications: No notable events documented.

## 2020-11-28 NOTE — Anesthesia Procedure Notes (Signed)
Procedure Name: General with mask airway Date/Time: 11/28/2020 8:30 AM Performed by: Inda Coke, CRNA Pre-anesthesia Checklist: Patient identified, Emergency Drugs available, Suction available, Timeout performed and Patient being monitored Patient Re-evaluated:Patient Re-evaluated prior to induction Oxygen Delivery Method: Ambu bag Preoxygenation: Pre-oxygenation with 100% oxygen Induction Type: IV induction Dental Injury: Teeth and Oropharynx as per pre-operative assessment

## 2020-11-28 NOTE — Discharge Instructions (Signed)
Electrical Cardioversion  Electrical cardioversion is the delivery of a jolt of electricity to restore a normal rhythm to the heart. A rhythm that is too fast or is not regular keeps the heart from pumping well. In this procedure, sticky patches or metal paddles are placed on the chest to deliver electricity to the heart from a device.  What can I expect after the procedure?  Your blood pressure, heart rate, breathing rate, and blood oxygen level will be monitored until you leave the hospital or clinic.  Your heart rhythm will be watched to make sure it does not change.  You may have some redness on the skin where the shocks were given.If this occurs, can use hydrocortisone cream or Aloe vera.  Follow these instructions at home:  Do not drive for 24 hours if you were given a sedative during your procedure.  Take over-the-counter and prescription medicines only as told by your health care provider.  Ask your health care provider how to check your pulse. Check it often.  Rest for 48 hours after the procedure or as told by your health care provider.  Avoid or limit your caffeine use as told by your health care provider.  Keep all follow-up visits as told by your health care provider. This is important.  Contact a health care provider if:  You feel like your heart is beating too quickly or your pulse is not regular.  You have a serious muscle cramp that does not go away.  Get help right away if:  You have discomfort in your chest.  You are dizzy or you feel faint.  You have trouble breathing or you are short of breath.  Your speech is slurred.  You have trouble moving an arm or leg on one side of your body.  Your fingers or toes turn cold or blue.  Summary  Electrical cardioversion is the delivery of a jolt of electricity to restore a normal rhythm to the heart.  This procedure may be done right away in an emergency or may be a scheduled procedure if the condition is not  an emergency.  Generally, this is a safe procedure.  After the procedure, check your pulse often as told by your health care provider.  This information is not intended to replace advice given to you by your health care provider. Make sure you discuss any questions you have with your health care provider. Document Revised: 10/03/2018 Document Reviewed: 10/03/2018 Elsevier Patient Education  2021 [employer] Inc.  

## 2020-11-29 NOTE — Addendum Note (Signed)
Addendum  created 11/29/20 1324 by Janeece Riggers, MD   Clinical Note Signed

## 2020-11-29 NOTE — Anesthesia Postprocedure Evaluation (Signed)
Anesthesia Post Note  Patient: Diana Reeves  Procedure(s) Performed: CARDIOVERSION     Patient location during evaluation: PACU Anesthesia Type: General Level of consciousness: awake and alert Pain management: pain level controlled Vital Signs Assessment: post-procedure vital signs reviewed and stable Respiratory status: spontaneous breathing, nonlabored ventilation, respiratory function stable and patient connected to nasal cannula oxygen Cardiovascular status: blood pressure returned to baseline and stable Postop Assessment: no apparent nausea or vomiting Anesthetic complications: no   No notable events documented.  Last Vitals:  Vitals:   11/28/20 0850 11/28/20 0901  BP: (!) 116/53 119/75  Pulse: 87 70  Resp: 15 11  Temp:    SpO2: 93% 96%    Last Pain:  Vitals:   11/28/20 0901  TempSrc:   PainSc: 0-No pain                 [name]

## 2020-12-02 DIAGNOSIS — J3089 Other allergic rhinitis: Secondary | ICD-10-CM | POA: Diagnosis not present

## 2020-12-04 ENCOUNTER — Telehealth: Payer: Self-pay | Admitting: Internal Medicine

## 2020-12-04 ENCOUNTER — Other Ambulatory Visit: Payer: Self-pay | Admitting: Internal Medicine

## 2020-12-04 DIAGNOSIS — E118 Type 2 diabetes mellitus with unspecified complications: Secondary | ICD-10-CM

## 2020-12-04 MED ORDER — ONETOUCH ULTRA 2 W/DEVICE KIT: 1.0000 | PACK | Freq: Two times a day (BID) | 1 refills | Status: DC

## 2020-12-04 MED ORDER — ONETOUCH ULTRA CONTROL VI SOLN: 1.0000 | Freq: Two times a day (BID) | 2 refills | Status: DC

## 2020-12-04 MED ORDER — ONETOUCH ULTRA VI STRP: ORAL_STRIP | 5 refills | Status: DC

## 2020-12-04 NOTE — Telephone Encounter (Signed)
Patient says at last visit provider advised her that he wanted her to start monitoring her blood sugar as well as taking tirzepatide [facility]) 2.5 MG/0.5ML Pen  Patient says provider also advised her would have nurse call her to go over how to monitor her blood sugar & how to give herself the injection but no one has contacted her  She says she has not gotten any equipment for her to monitor this & would like for someone to call her

## 2020-12-10 DIAGNOSIS — J3089 Other allergic rhinitis: Secondary | ICD-10-CM | POA: Diagnosis not present

## 2020-12-17 DIAGNOSIS — J3089 Other allergic rhinitis: Secondary | ICD-10-CM | POA: Diagnosis not present

## 2020-12-18 ENCOUNTER — Ambulatory Visit: Payer: Medicare PPO | Admitting: Cardiovascular Disease

## 2020-12-20 ENCOUNTER — Encounter: Payer: Self-pay | Admitting: Cardiovascular Disease

## 2020-12-20 ENCOUNTER — Ambulatory Visit: Payer: Medicare PPO | Admitting: Cardiovascular Disease

## 2020-12-20 ENCOUNTER — Other Ambulatory Visit: Payer: Self-pay

## 2020-12-20 VITALS — BP 118/84 | HR 132 | Ht 65.0 in | Wt 200.8 lb

## 2020-12-20 DIAGNOSIS — E668 Other obesity: Secondary | ICD-10-CM | POA: Diagnosis not present

## 2020-12-20 DIAGNOSIS — I4819 Other persistent atrial fibrillation: Secondary | ICD-10-CM | POA: Diagnosis not present

## 2020-12-20 DIAGNOSIS — E118 Type 2 diabetes mellitus with unspecified complications: Secondary | ICD-10-CM | POA: Diagnosis not present

## 2020-12-20 DIAGNOSIS — I10 Essential (primary) hypertension: Secondary | ICD-10-CM

## 2020-12-20 MED ORDER — DILTIAZEM HCL ER COATED BEADS 240 MG PO CP24: 240.0000 mg | ORAL_CAPSULE | Freq: Every day | ORAL | 1 refills | Status: DC

## 2020-12-20 NOTE — Progress Notes (Signed)
Cardiology Office Note:    Date:  12/21/2020   ID:  Diana Reeves, DOB 1950-10-16, MRN [id number]  PCP:  Janith Lima, MD   [facility] Providers Cardiologist:  Sanda Klein, MD     Referring MD: Janith Lima, MD   Chief Complaint  Patient presents with   Atrial Fibrillation      History of Present Illness:    Diana Reeves is a 70 y.o. female with a hx of recent onset persistent atrial fibrillation.    She has never been aware of palpitations, but the atrial fibrillation has been associated with reduced exercise capacity.  She does not have a history of stroke/TIA or other embolic events.  She has hypertension and type 2 diabetes mellitus but does not have any known vascular disease.  She has been compliant with her anticoagulant.  She underwent successful cardioversion on September 15.  Post cardioversion ECG showed sinus rhythm and nonspecific T wave changes with a QTC of 457 ms.  She felt better immediately.  She noticed that she no longer had diaphoresis when she climbs stairs and she felt more stamina.  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  She ran out of diltiazem 3 days before this appointment and presents today in atrial fibrillation with rapid ventricular response.  She was unaware of the recurrence of the arrhythmia.  She denies dizziness, syncope, palpitations, lower extremity edema, claudication, falls, injuries or bleeding problems.  She has a history of treated hypertension,  NASH,  as well as type 2 diabetes mellitus but does not have known coronary or peripheral arterial disease.  Recent thyroid function tests were normal.  Glycemic control is fair with a hemoglobin A1c of 7.8%.  Her most recent lipid profile showed an LDL of 89 and HDL of 46 with normal triglycerides.  Her echo, performed subsequent to this visit showed LVEF of 50-55%, no significant valvular abnormalities and normal size left  atrium (end-systolic diameter was measured at 4.0 cm, but the end-systolic volume index was only 24 mL/meters squared).  She does not have symptoms of obstructive sleep apnea and the right heart parameters appear to be normal.  Her sister has had problems with atrial fibrillation recently.  Her mother had bypass surgery at age 83 and lived into her 23s.  Her son had a "heart attack" after undergoing chemotherapy for testicular cancer.  He died at age 9.    Past Medical History:  Diagnosis Date   Allergic state 12/26/2016   Basal cell carcinoma    Benign neoplasm of colon    Degenerative cervical disc    Depression    Diverticulosis of colon (without mention of hemorrhage)    Esophageal reflux    Glaucoma    Osteoporosis, unspecified 12/2018   2007 T score -3.0, 2012 T score -1.9, 2015 T score -2.1 overall stable, 2018 T score -2.0, 2020 T score -1.8   Other chronic nonalcoholic liver disease    Sinusitis 12/26/2016   Stricture and stenosis of esophagus     Past Surgical History:  Procedure Laterality Date   ABDOMINAL HYSTERECTOMY  1993   TAH-LSO-Post. repair-[place]   CARDIOVERSION N/A 11/28/2020   Procedure: CARDIOVERSION;  Surgeon: Jerline Pain, MD;  Location: [facility];  Service: Cardiovascular;  Laterality: N/A;   COLONOSCOPY     EYE SURGERY  10/18/2012   lt eye   knee arthroscop Right 2018   left arm  surgery  7-09   POLYPECTOMY      Current Medications: Current Meds  Medication Sig   Azelastine-Fluticasone 137-50 MCG/ACT SUSP Place 1 spray into the nose daily as needed (allergies).   Blood Glucose Calibration (OT ULTRA/FASTTK CNTRL SOLN) SOLN 1 Act by In Vitro route 2 (two) times daily.   Blood Glucose Monitoring Suppl (ONE TOUCH ULTRA 2) w/Device KIT 1 Act by Does not apply route 2 (two) times daily.   Cholecalciferol 50 MCG (2000 UT) TABS Take 1 tablet (2,000 Units total) by mouth daily.   dorzolamide-timolol (COSOPT) 22.3-6.8 MG/ML ophthalmic solution Place 1  drop into the right eye 2 (two) times daily.   DULoxetine (CYMBALTA) 60 MG capsule TAKE 1 CAPSULE BY MOUTH DAILY   EPINEPHrine 0.3 mg/0.3 mL IJ SOAJ injection Inject 0.3 mg into the muscle as needed for anaphylaxis.   glucose blood (ONETOUCH ULTRA) test strip Use as instructed   NEXIUM 40 MG capsule TAKE 1 CAPSULE (40 MG TOTAL) BY MOUTH DAILY BEFORE BREAKFAST.   NONFORMULARY OR COMPOUNDED ITEM Estradiol vaginal cream 0.02% insert vaginally twice weeky (Patient taking differently: Estradiol vaginal cream 0.02% insert vaginally twice weekly as needed for dryness/ irritation)   olmesartan (BENICAR) 20 MG tablet Take 1 tablet (20 mg total) by mouth daily.   olmesartan (BENICAR) 40 MG tablet Take 20 mg by mouth daily.   rivaroxaban (XARELTO) 20 MG TABS tablet Take 1 tablet (20 mg total) by mouth daily with supper.   tirzepatide [facility]) 2.5 MG/0.5ML Pen Inject 2.5 mg into the skin once a week.   zolpidem (AMBIEN) 10 MG tablet Take 1 tablet (10 mg total) by mouth at bedtime as needed for sleep.   [DISCONTINUED] diltiazem (CARDIZEM CD) 120 MG 24 hr capsule Take 240 mg by mouth daily.   [DISCONTINUED] diltiazem (CARDIZEM CD) 240 MG 24 hr capsule Take 1 capsule (240 mg total) by mouth daily.     Allergies:   Dust mite extract and Mold extract [trichophyton mentagrophyte]   Social History   Socioeconomic History   Marital status: Divorced    Spouse name: Not on file   Number of children: 3   Years of education: Not on file   Highest education level: Not on file  Occupational History   Occupation: retired  Tobacco Use   Smoking status: Former   Smokeless tobacco: Never  Scientific laboratory technician Use: Never used  Substance and Sexual Activity   Alcohol use: Yes    Alcohol/week: 2.0 standard drinks    Types: 2 Glasses of wine per week    Comment: occasional   Drug use: No   Sexual activity: Not Currently    Comment: 1st intercourse 70 yo-More than 5 partner,  Other Topics Concern   Not on  file  Social History Narrative   Not on file   Social Determinants of Health   Financial Resource Strain: Low Risk    Difficulty of Paying Living Expenses: Not hard at all  Food Insecurity: No Food Insecurity   Worried About Charity fundraiser in the Last Year: Never true   [place] in the Last Year: Never true  Transportation Needs: No Transportation Needs   Lack of Transportation (Medical): No   Lack of Transportation (Non-Medical): No  Physical Activity: Inactive   Days of Exercise per Week: 0 days   Minutes of Exercise per Session: 0 min  Stress: No Stress Concern Present   Feeling of Stress : Not at all  Social Connections: Unknown   Frequency of Communication with Friends and Family: More than three times a week   Frequency of Social Gatherings with Friends and Family: More than three times a week   Attends Religious Services: More than 4 times per year   Active Member of [employer] or Organizations: No   Attends Music therapist: More than 4 times per year   Marital Status: Not on file     Family History: The patient's family history includes Cancer in her father, maternal grandmother, and son; Colon cancer (age of onset: 27) in her father; Diabetes in her paternal grandfather; Heart attack in her son; Heart disease in her mother; Hypertension in her father. There is no history of Esophageal cancer, Stomach cancer, or Rectal cancer.  ROS:   Please see the history of present illness.     All other systems reviewed and are negative.  EKGs/Labs/Other Studies Reviewed:    The following studies were reviewed today: Notes from Dr. Ronnald Ramp.   Echocardiogram 11/07/2020  1. Left ventricular ejection fraction, by estimation, is 50 to 55%. The  left ventricle has low normal function. The left ventricle has no regional  wall motion abnormalities. Left ventricular diastolic function could not  be evaluated.   2. Right ventricular systolic function is normal. The  right ventricular  size is normal. There is normal pulmonary artery systolic pressure.   3. The mitral valve is normal in structure. Mild mitral valve  regurgitation. No evidence of mitral stenosis.   4. The aortic valve is grossly normal. Aortic valve regurgitation is  mild. No aortic stenosis is present.   EKG:  EKG is ordered today.  Shows atrial fibrillation rapid ventricular response at about 142 bpm.  QTc is hard to measure accurately but calculates at about 480 ms.  Recent Labs: 01/31/2020: Pro B Natriuretic peptide (BNP) 69.0 10/28/2020: ALT 40; TSH 0.96 11/21/2020: BUN 26; Creatinine, Ser 1.13; Hemoglobin 12.4; Platelets 257; Potassium 4.9; Sodium 142  Recent Lipid Panel    Component Value Date/Time   CHOL 162 01/31/2020 0906   TRIG 136.0 01/31/2020 0906   HDL 45.70 01/31/2020 0906   CHOLHDL 4 01/31/2020 0906   VLDL 27.2 01/31/2020 0906   LDLCALC 89 01/31/2020 0906     Risk Assessment/Calculations:    CHA2DS2-VASc Score = 4  This indicates a 4.8% annual risk of stroke. The patient's score is based upon: CHF History: 0 HTN History: 1 Diabetes History: 1 Stroke History: 0 Vascular Disease History: 0 Age Score: 1 Gender Score: 1         Physical Exam:    VS:  BP 118/84   Pulse (!) 132   Ht _0  (1.651 m)   Wt 91.1 kg   LMP 03/17/1991   SpO2 95%   BMI 33.41 kg/m     Wt Readings from Last 3 Encounters:  12/20/20 91.1 kg  11/28/20 91.6 kg  10/31/20 93.2 kg      General: Alert, oriented x3, no distress, mildly obese Head: no evidence of trauma, PERRL, EOMI, no exophtalmos or lid lag, no myxedema, no xanthelasma; normal ears, nose and oropharynx Neck: normal jugular venous pulsations and no hepatojugular reflux; brisk carotid pulses without delay and no carotid bruits Chest: clear to auscultation, no signs of consolidation by percussion or palpation, normal fremitus, symmetrical and full respiratory excursions Cardiovascular: normal position and quality  of the apical impulse, rapid irregular rhythm, normal first and second heart sounds, no murmurs, rubs or gallops  Abdomen: no tenderness or distention, no masses by palpation, no abnormal pulsatility or arterial bruits, normal bowel sounds, no hepatosplenomegaly Extremities: no clubbing, cyanosis or edema; 2+ radial, ulnar and brachial pulses bilaterally; 2+ right femoral, posterior tibial and dorsalis pedis pulses; 2+ left femoral, posterior tibial and dorsalis pedis pulses; no subclavian or femoral bruits Neurological: grossly nonfocal Psych: Normal mood and affect   ASSESSMENT:    1. Essential hypertension, benign   2. Persistent atrial fibrillation ([place])   3. Type 2 diabetes mellitus with complication, without long-term current use of insulin ([place])   4. Moderate obesity    PLAN:    In order of problems listed above:  Afib: She had symptom improvement after her successful cardioversion, but presents today in atrial fibrillation with rapid ventricular response, after being off diltiazem for 3 consecutive days.  She has been compliant with the anticoagulant.  We need to restart her rate control medication, diltiazem 240 mg once daily.  Discussed options for further management point.  Clearly we will need a rhythm control strategy and we discussed the relative pros and cons of the pharmacological versus radiofrequency ablation approaches.  Her sister had an ablation.  We will refer to EP to discuss ablation as an early strategy, since she clearly prefers this rather than antiarrhythmic medications.  Her QTC is borderline at about 457-480 ms at baseline and this may limit our antiarrhythmic choices anyway.  We discussed the fact that we need some type of reliable electrical monitoring device such as a smart watch or a Kardia mobile monitor. HTN: She would like to see if diltiazem alone is enough to control her blood pressure, would prefer to discontinue her olmesartan.  It is fine to try that. DM:  Target hemoglobin A1c preferably less than 7%.  Obesity: Underlies many of her health problems and increases the prevalence of atrial fibrillation.  Encouraged weight loss.  She does not have symptoms of daytime hypersomnolence or other indications to suspect obstructive sleep apnea.   Shared Decision Making/Informed Consent The risks (stroke, cardiac arrhythmias rarely resulting in the need for a temporary or permanent pacemaker, skin irritation or burns and complications associated with conscious sedation including aspiration, arrhythmia, respiratory failure and death), benefits (restoration of normal sinus rhythm) and alternatives of a direct current cardioversion were explained in detail to Diana Reeves and she agrees to proceed.      Medication Adjustments/Labs and Tests Ordered: Current medicines are reviewed at length with the patient today.  Concerns regarding medicines are outlined above.  Orders Placed This Encounter  Procedures   Ambulatory referral to Cardiac Electrophysiology   EKG 12-Lead   Meds ordered this encounter  Medications   diltiazem (CARDIZEM CD) 240 MG 24 hr capsule    Sig: Take 1 capsule (240 mg total) by mouth daily.    Dispense:  90 capsule    Refill:  1    Patient Instructions  Medication Instructions:  No changes *If you need a refill on your cardiac medications before your next appointment, please call your pharmacy*   Lab Work: None ordered If you have labs (blood work) drawn today and your tests are completely normal, you will receive your results only by: [place] (if you have MyChart) OR A paper copy in the mail If you have any lab test that is abnormal or we need to change your treatment, we will call you to review the results.   Testing/Procedures: None ordered   Follow-Up: At [facility], you and  your health needs are our priority.  As part of our continuing mission to provide you with exceptional heart care, we have created  designated Provider Care Teams.  These Care Teams include your primary Cardiologist (physician) and Advanced Practice Providers (APPs -  Physician Assistants and Nurse Practitioners) who all work together to provide you with the care you need, when you need it.  We recommend signing up for the patient portal called "MyChart".  Sign up information is provided on this After Visit Summary.  MyChart is used to connect with patients for Virtual Visits (Telemedicine).  Patients are able to view lab/test results, encounter notes, upcoming appointments, etc.  Non-urgent messages can be sent to your provider as well.   To learn more about what you can do with MyChart, go to [email or web address].    Your next appointment:   3 month(s)  The format for your next appointment:   In Person  Provider:   Sanda Klein, MD  A referral has been placed for the Electrophysiology clinic at [facility]   Other [place]: Website: [email or web address]  DR. Sallyanne Kuster RECOMMENDS YOU PURCHASE  " Kardia" By [employer] FROM THE  GOOGLE[employer]  APP PLAY STORE.  THE APP IS FREE , BUT THE  EQUIPMENT HAS A COST. IT ALLOWS YOU TO OBTAIN A RECORDING OF YOUR HEART RATE AND RHYTHM BY PROVIDING A SHORT STRIP THAT YOU CAN SHARE WITH YOUR PROVIDER.      Signed, Sanda Klein, MD  12/21/2020 7:55 PM    [place]

## 2020-12-20 NOTE — Patient Instructions (Signed)
Medication Instructions:  No changes *If you need a refill on your cardiac medications before your next appointment, please call your pharmacy*   Lab Work: None ordered If you have labs (blood work) drawn today and your tests are completely normal, you will receive your results only by: [place] (if you have MyChart) OR A paper copy in the mail If you have any lab test that is abnormal or we need to change your treatment, we will call you to review the results.   Testing/Procedures: None ordered   Follow-Up: At [facility], you and your health needs are our priority.  As part of our continuing mission to provide you with exceptional heart care, we have created designated Provider Care Teams.  These Care Teams include your primary Cardiologist (physician) and Advanced Practice Providers (APPs -  Physician Assistants and Nurse Practitioners) who all work together to provide you with the care you need, when you need it.  We recommend signing up for the patient portal called "MyChart".  Sign up information is provided on this After Visit Summary.  MyChart is used to connect with patients for Virtual Visits (Telemedicine).  Patients are able to view lab/test results, encounter notes, upcoming appointments, etc.  Non-urgent messages can be sent to your provider as well.   To learn more about what you can do with MyChart, go to [email or web address].    Your next appointment:   3 month(s)  The format for your next appointment:   In Person  Provider:   Sanda Klein, MD  A referral has been placed for the Electrophysiology clinic at [facility]   Other [place]: Website: [email or web address]  DR. Sallyanne Kuster RECOMMENDS YOU PURCHASE  " Kardia" [employer] FROM THE  G[employer]  APP PLAY STORE.  THE APP IS FREE , BUT THE  EQUIPMENT HAS A COST. IT ALLOWS YOU TO OBTAIN A RECORDING OF YOUR HEART RATE AND RHYTHM BY PROVIDING A SHORT  STRIP THAT YOU CAN SHARE WITH YOUR PROVIDER.

## 2020-12-27 ENCOUNTER — Telehealth: Payer: Self-pay | Admitting: Internal Medicine

## 2020-12-27 ENCOUNTER — Other Ambulatory Visit: Payer: Self-pay

## 2020-12-27 ENCOUNTER — Ambulatory Visit (INDEPENDENT_AMBULATORY_CARE_PROVIDER_SITE_OTHER): Payer: Medicare PPO | Admitting: Obstetrics & Gynecology

## 2020-12-27 ENCOUNTER — Encounter: Payer: Self-pay | Admitting: Obstetrics & Gynecology

## 2020-12-27 VITALS — BP 160/90 | HR 105 | Resp 16 | Ht 64.25 in | Wt 200.0 lb

## 2020-12-27 DIAGNOSIS — Z90722 Acquired absence of ovaries, bilateral: Secondary | ICD-10-CM

## 2020-12-27 DIAGNOSIS — Z90710 Acquired absence of both cervix and uterus: Secondary | ICD-10-CM | POA: Diagnosis not present

## 2020-12-27 DIAGNOSIS — N952 Postmenopausal atrophic vaginitis: Secondary | ICD-10-CM

## 2020-12-27 DIAGNOSIS — M810 Age-related osteoporosis without current pathological fracture: Secondary | ICD-10-CM | POA: Diagnosis not present

## 2020-12-27 DIAGNOSIS — Z6835 Body mass index (BMI) 35.0-35.9, adult: Secondary | ICD-10-CM

## 2020-12-27 DIAGNOSIS — Z01419 Encounter for gynecological examination (general) (routine) without abnormal findings: Secondary | ICD-10-CM

## 2020-12-27 DIAGNOSIS — Z9189 Other specified personal risk factors, not elsewhere classified: Secondary | ICD-10-CM | POA: Diagnosis not present

## 2020-12-27 DIAGNOSIS — Z9079 Acquired absence of other genital organ(s): Secondary | ICD-10-CM | POA: Diagnosis not present

## 2020-12-27 NOTE — Progress Notes (Signed)
Diana Reeves [birth year] [id number]   History:    70 y.o.  G3P3L3    RP:  Established patient presenting for annual gyn exam    HPI: S/P TAH LSO with posterior repair.  Postmenopause, well on no systemic HRT.  No pelvic pain.  Uses estradiol compound cream vaginally twice a week.  Urine and bowel movements normal.  Only has urinary urgency/leakage when sits in recliner.  Breasts normal.  Body mass index 35.19.  Needs to increase physical activity and decrease calories and nutrition.  Health labs with family physician.  Colonoscopy 2020.  A. Fib followed by Cardio.   Past medical history,surgical history, family history and social history were all reviewed and documented in the EPIC chart.  Gynecologic History Patient's last menstrual period was 03/17/1991.  Obstetric History OB History  Gravida Para Term Preterm AB Living  3 3 3     3   SAB IAB Ectopic Multiple Live Births               # Outcome Date GA Lbr Len/2nd Weight Sex Delivery Anes PTL Lv  3 Term           2 Term           1 Term              ROS: A ROS was performed and pertinent positives and negatives are included in the history.  GENERAL: No fevers or chills. HEENT: No change in vision, no earache, sore throat or sinus congestion. NECK: No pain or stiffness. CARDIOVASCULAR: No chest pain or pressure. No palpitations. PULMONARY: No shortness of breath, cough or wheeze. GASTROINTESTINAL: No abdominal pain, nausea, vomiting or diarrhea, melena or bright red blood per rectum. GENITOURINARY: No urinary frequency, urgency, hesitancy or dysuria. MUSCULOSKELETAL: No joint or muscle pain, no back pain, no recent trauma. DERMATOLOGIC: No rash, no itching, no lesions. ENDOCRINE: No polyuria, polydipsia, no heat or cold intolerance. No recent change in weight. HEMATOLOGICAL: No anemia or easy bruising or bleeding. NEUROLOGIC: No headache, seizures, numbness, tingling or weakness. PSYCHIATRIC: No depression, no loss of interest in  normal activity or change in sleep pattern.     Exam:   BP (!) 160/90   Pulse (!) 105   Resp 16   Ht 5' 4.25" (1.632 m)   Wt 200 lb (90.7 kg)   LMP 03/17/1991   BMI 34.06 kg/m   Body mass index is 34.06 kg/m.  General appearance : Well developed well nourished female. No acute distress HEENT: Eyes: no retinal hemorrhage or exudates,  Neck supple, trachea midline, no carotid bruits, no thyroidmegaly Lungs: Clear to auscultation, no rhonchi or wheezes, or rib retractions  Heart: Regular rate and rhythm, no murmurs or gallops Breast:Examined in sitting and supine position were symmetrical in appearance, no palpable masses or tenderness,  no skin retraction, no nipple inversion, no nipple discharge, no skin discoloration, no axillary or supraclavicular lymphadenopathy Abdomen: no palpable masses or tenderness, no rebound or guarding Extremities: no edema or skin discoloration or tenderness  Pelvic: Vulva: Normal             Vagina: No gross lesions or discharge  Cervix/Uterus absent  Adnexa  Without masses or tenderness  Anus: Normal   Assessment/Plan:  70 y.o. female for annual exam   1. Well female exam with routine gynecological exam Gynecologic exam status post TAH LSO.  No indication for Pap test at this time.  Breast exam normal.  Screening  mammogram September 2022 was negative.  Colonoscopy 2020.  2. S/P TAH-LSO  3. Postmenopausal atrophic vaginitis Compound Estradiol vaginally twice a week.  Will send prescription to pharmacy.  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  Actonel stopped at that time.  Repeat a BD now. - DG Bone Density; Future  5. Class 2 severe obesity due to excess calories with serious comorbidity and body mass index (BMI) of 35.0 to 35.9 in adult [facility])  Decrease calories/carbs.  Aerobic activities 5 times a week and light weightlifting every 2 days.  Princess Bruins MD, 3:39 PM 12/27/2020

## 2020-12-27 NOTE — Telephone Encounter (Signed)
Patient is calling in requesting a call back from nurse  Says she needs someone to walk her through checking/monitoring her blood sugar (how to use machine etc)

## 2020-12-30 ENCOUNTER — Telehealth: Payer: Self-pay | Admitting: *Deleted

## 2020-12-30 MED ORDER — NONFORMULARY OR COMPOUNDED ITEM: 4 refills | Status: DC

## 2020-12-30 NOTE — Telephone Encounter (Signed)
Rx called in 

## 2020-12-30 NOTE — Telephone Encounter (Signed)
-----   Message from Princess Bruins, MD sent at 12/27/2020  4:13 PM EDT ----- Regarding: Compound Estradiol cream prescription Please send prescription.

## 2021-01-01 ENCOUNTER — Ambulatory Visit: Payer: Medicare PPO

## 2021-01-01 ENCOUNTER — Other Ambulatory Visit: Payer: Self-pay

## 2021-01-01 DIAGNOSIS — E118 Type 2 diabetes mellitus with unspecified complications: Secondary | ICD-10-CM

## 2021-01-01 MED ORDER — TIRZEPATIDE 2.5 MG/0.5ML ~~LOC~~ SOAJ
2.5000 mg | SUBCUTANEOUS | 0 refills | Status: DC
Start: 2021-01-01 — End: 2021-02-20

## 2021-01-01 NOTE — Patient Instructions (Signed)
Diabetes Mellitus and Nutrition, Adult When you have diabetes, or diabetes mellitus, it is very important to have healthy eating habits because your blood sugar (glucose) levels are greatly affected by what you eat and drink. Eating healthy foods in the right amounts, at about the same times every day, can help you:  Control your blood glucose.  Lower your risk of heart disease.  Improve your blood pressure.  Reach or maintain a healthy weight. What can affect my meal plan? Every person with diabetes is different, and each person has different needs for a meal plan. Your health care provider may recommend that you work with a dietitian to make a meal plan that is best for you. Your meal plan may vary depending on factors such as:  The calories you need.  The medicines you take.  Your weight.  Your blood glucose, blood pressure, and cholesterol levels.  Your activity level.  Other health conditions you have, such as heart or kidney disease. How do carbohydrates affect me? Carbohydrates, also called carbs, affect your blood glucose level more than any other type of food. Eating carbs naturally raises the amount of glucose in your blood. Carb counting is a method for keeping track of how many carbs you eat. Counting carbs is important to keep your blood glucose at a healthy level, especially if you use insulin or take certain oral diabetes medicines. It is important to know how many carbs you can safely have in each meal. This is different for every person. Your dietitian can help you calculate how many carbs you should have at each meal and for each snack. How does alcohol affect me? Alcohol can cause a sudden decrease in blood glucose (hypoglycemia), especially if you use insulin or take certain oral diabetes medicines. Hypoglycemia can be a life-threatening condition. Symptoms of hypoglycemia, such as sleepiness, dizziness, and confusion, are similar to symptoms of having too much  alcohol.  Do not drink alcohol if: ? Your health care provider tells you not to drink. ? You are pregnant, may be pregnant, or are planning to become pregnant.  If you drink alcohol: ? Do not drink on an empty stomach. ? Limit how much you use to:  0-1 drink a day for women.  0-2 drinks a day for men. ? Be aware of how much alcohol is in your drink. In the [place] one drink equals one 12 oz bottle of beer (355 mL), one 5 oz glass of wine (148 mL), or one 1 oz glass of hard liquor (44 mL). ? Keep yourself hydrated with water, diet soda, or unsweetened iced tea.  Keep in mind that regular soda, juice, and other mixers may contain a lot of sugar and must be counted as carbs. What are tips for following this plan? Reading food labels  Start by checking the serving size on the "Nutrition Facts" label of packaged foods and drinks. The amount of calories, carbs, fats, and other nutrients listed on the label is based on one serving of the item. Many items contain more than one serving per package.  Check the total grams (g) of carbs in one serving. You can calculate the number of servings of carbs in one serving by dividing the total carbs by 15. For example, if a food has 30 g of total carbs per serving, it would be equal to 2 servings of carbs.  Check the number of grams (g) of saturated fats and trans fats in one serving. Choose foods that have   a low amount or none of these fats.  Check the number of milligrams (mg) of salt (sodium) in one serving. Most people should limit total sodium intake to less than 2,300 mg per day.  Always check the nutrition information of foods labeled as "low-fat" or "nonfat." These foods may be higher in added sugar or refined carbs and should be avoided.  Talk to your dietitian to identify your daily goals for nutrients listed on the label. Shopping  Avoid buying canned, pre-made, or processed foods. These foods tend to be high in fat, sodium, and added  sugar.  Shop around the outside edge of the grocery store. This is where you will most often find fresh fruits and vegetables, bulk grains, fresh meats, and fresh dairy. Cooking  Use low-heat cooking methods, such as baking, instead of high-heat cooking methods like deep frying.  Cook using healthy oils, such as olive, canola, or sunflower oil.  Avoid cooking with butter, cream, or high-fat meats. Meal planning  Eat meals and snacks regularly, preferably at the same times every day. Avoid going long periods of time without eating.  Eat foods that are high in fiber, such as fresh fruits, vegetables, beans, and whole grains. Talk with your dietitian about how many servings of carbs you can eat at each meal.  Eat 4-6 oz (112-168 g) of lean protein each day, such as lean meat, chicken, fish, eggs, or tofu. One ounce (oz) of lean protein is equal to: ? 1 oz (28 g) of meat, chicken, or fish. ? 1 egg. ?  cup (62 g) of tofu.  Eat some foods each day that contain healthy fats, such as avocado, nuts, seeds, and fish.   What foods should I eat? Fruits Berries. Apples. Oranges. Peaches. Apricots. Plums. Grapes. Mango. Papaya. Pomegranate. Kiwi. Cherries. Vegetables Lettuce. Spinach. Leafy greens, including kale, chard, collard greens, and mustard greens. Beets. Cauliflower. Cabbage. Broccoli. Carrots. Green beans. Tomatoes. Peppers. Onions. Cucumbers. Brussels sprouts. Grains Whole grains, such as whole-wheat or whole-grain bread, crackers, tortillas, cereal, and pasta. Unsweetened oatmeal. Quinoa. Brown or wild rice. Meats and other proteins Seafood. Poultry without skin. Lean cuts of poultry and beef. Tofu. Nuts. Seeds. Dairy Low-fat or fat-free dairy products such as milk, yogurt, and cheese. The items listed above may not be a complete list of foods and beverages you can eat. Contact a dietitian for more information. What foods should I avoid? Fruits Fruits canned with  syrup. Vegetables Canned vegetables. Frozen vegetables with butter or cream sauce. Grains Refined white flour and flour products such as bread, pasta, snack foods, and cereals. Avoid all processed foods. Meats and other proteins Fatty cuts of meat. Poultry with skin. Breaded or fried meats. Processed meat. Avoid saturated fats. Dairy Full-fat yogurt, cheese, or milk. Beverages Sweetened drinks, such as soda or iced tea. The items listed above may not be a complete list of foods and beverages you should avoid. Contact a dietitian for more information. Questions to ask a health care provider  Do I need to meet with a diabetes educator?  Do I need to meet with a dietitian?  What number can I call if I have questions?  When are the best times to check my blood glucose? Where to find more information:  American Diabetes Association: diabetes.org  Academy of Nutrition and Dietetics: www.eatright.org  National Institute of Diabetes and Digestive and Kidney Diseases: www.niddk.nih.gov  Association of Diabetes Care and Education Specialists: www.diabeteseducator.org Summary  It is important to have healthy eating   habits because your blood sugar (glucose) levels are greatly affected by what you eat and drink.  A healthy meal plan will help you control your blood glucose and maintain a healthy lifestyle.  Your health care provider may recommend that you work with a dietitian to make a meal plan that is best for you.  Keep in mind that carbohydrates (carbs) and alcohol have immediate effects on your blood glucose levels. It is important to count carbs and to use alcohol carefully. This information is not intended to replace advice given to you by your health care provider. Make sure you discuss any questions you have with your health care provider. Document Revised: 02/07/2019 Document Reviewed: 02/07/2019 Elsevier Patient Education  2021 Elsevier Inc.  

## 2021-01-01 NOTE — Progress Notes (Signed)
Patient came in office today to get a better understanding on how to use her blood glucose monitor. Advised pt on how to use monitor and what are acceptable values. Patient verbalized understanding.

## 2021-01-02 ENCOUNTER — Encounter: Payer: Self-pay | Admitting: Internal Medicine

## 2021-01-02 ENCOUNTER — Ambulatory Visit (INDEPENDENT_AMBULATORY_CARE_PROVIDER_SITE_OTHER): Payer: Medicare PPO | Admitting: Internal Medicine

## 2021-01-02 DIAGNOSIS — I4819 Other persistent atrial fibrillation: Secondary | ICD-10-CM | POA: Diagnosis not present

## 2021-01-02 DIAGNOSIS — R059 Cough, unspecified: Secondary | ICD-10-CM | POA: Diagnosis not present

## 2021-01-02 DIAGNOSIS — E118 Type 2 diabetes mellitus with unspecified complications: Secondary | ICD-10-CM

## 2021-01-02 DIAGNOSIS — R062 Wheezing: Secondary | ICD-10-CM | POA: Insufficient documentation

## 2021-01-02 MED ORDER — METHYLPREDNISOLONE ACETATE 80 MG/ML IJ SUSP
80.0000 mg | Freq: Once | INTRAMUSCULAR | Status: AC
  Administered 2021-01-02: 80 mg via INTRAMUSCULAR

## 2021-01-02 MED ORDER — HYDROCODONE BIT-HOMATROP MBR 5-1.5 MG/5ML PO SOLN: 5.0000 mL | Freq: Four times a day (QID) | ORAL | 0 refills | Status: AC | PRN

## 2021-01-02 MED ORDER — METHYLPREDNISOLONE 4 MG PO TBPK: ORAL_TABLET | ORAL | 0 refills | Status: DC

## 2021-01-02 MED ORDER — DOXYCYCLINE HYCLATE 100 MG PO TABS: 100.0000 mg | ORAL_TABLET | Freq: Two times a day (BID) | ORAL | 0 refills | Status: DC

## 2021-01-02 NOTE — Assessment & Plan Note (Signed)
Stable rate and volume, continue to moinitor

## 2021-01-02 NOTE — Assessment & Plan Note (Signed)
Lab Results  Component Value Date   HGBA1C 7.8 (H) 10/28/2020   Stable, pt to continue current medical treatment monjauro

## 2021-01-02 NOTE — Patient Instructions (Signed)
You had the steroid shot today  Please take all new medication as prescribed   - the antibiotic, cough medicine and medrol pak as before  Please continue all other medications as before, and refills have been done if requested.  Please have the pharmacy call with any other refills you may need.  Please keep your appointments with your specialists as you may have planned

## 2021-01-02 NOTE — Progress Notes (Addendum)
Patient ID: Diana Reeves, female   DOB: [birth year], 70 y.o.   MRN: [id number]        Chief Complaint: follow up cough and wheezing       HPI:  Diana Reeves is a 70 y.o. female here with c/o Here with acute onset mild to mod 2-3 days ST, HA, general weakness and malaise, with prod cough greenish sputum, but Pt denies chest pain, increased sob or doe, orthopnea, PND, increased LE swelling, palpitations, dizziness or syncope, with mild wheezing in last 1 day.   Pt denies polydipsia, polyuria, or new focal neuro s/s.  Has been in Afib recently per pt after unsuccessful tx Wt Readings from Last 3 Encounters:  01/02/21 201 lb (91.2 kg)  12/27/20 200 lb (90.7 kg)  12/20/20 200 lb 12.8 oz (91.1 kg)   BP Readings from Last 3 Encounters:  01/02/21 136/70  12/27/20 (!) 160/90  12/20/20 118/84         Past Medical History:  Diagnosis Date   A-fib [facility])    Allergic state 12/26/2016   Basal cell carcinoma    Benign neoplasm of colon    Degenerative cervical disc    Depression    Diverticulosis of colon (without mention of hemorrhage)    Esophageal reflux    Glaucoma    Osteoporosis, unspecified 12/2018   2007 T score -3.0, 2012 T score -1.9, 2015 T score -2.1 overall stable, 2018 T score -2.0, 2020 T score -1.8   Other chronic nonalcoholic liver disease    Sinusitis 12/26/2016   Stricture and stenosis of esophagus    Past Surgical History:  Procedure Laterality Date   ABDOMINAL HYSTERECTOMY  1993   TAH-LSO-Post. repair-[facility]   CARDIOVERSION N/A 11/28/2020   Procedure: CARDIOVERSION;  Surgeon: Jerline Pain, MD;  Location: [facility];  Service: Cardiovascular;  Laterality: N/A;   COLONOSCOPY     EYE SURGERY  10/18/2012   lt eye   knee arthroscop Right 2018   left arm surgery  7-09   POLYPECTOMY      reports that she has quit smoking. She has never used smokeless tobacco. She reports that she does not currently use alcohol. She reports that she does not use drugs. family history  includes Cancer in her father, maternal grandmother, and son; Colon cancer (age of onset: 37) in her father; Diabetes in her paternal grandfather; Heart attack in her son; Heart disease in her mother; Hypertension in her father. Allergies  Allergen Reactions   Dust Mite Extract     Asthma like symptoms    Mold Extract [Trichophyton Mentagrophyte]     Asthma like symptoms    Current Outpatient Medications on File Prior to Visit  Medication Sig Dispense Refill   Azelastine-Fluticasone 137-50 MCG/ACT SUSP Place 1 spray into the nose daily as needed (allergies).     Blood Glucose Calibration (OT ULTRA/FASTTK CNTRL SOLN) SOLN 1 Act by In Vitro route 2 (two) times daily. 1 each 2   Blood Glucose Monitoring Suppl (ONE TOUCH ULTRA 2) w/Device KIT 1 Act by Does not apply route 2 (two) times daily. 1 kit 1   diltiazem (CARDIZEM CD) 240 MG 24 hr capsule Take 1 capsule (240 mg total) by mouth daily. 90 capsule 1   dorzolamide-timolol (COSOPT) 22.3-6.8 MG/ML ophthalmic solution Place 1 drop into the right eye 2 (two) times daily.     DULoxetine (CYMBALTA) 60 MG capsule TAKE 1 CAPSULE BY MOUTH DAILY 90 capsule 1   EPINEPHrine 0.3  mg/0.3 mL IJ SOAJ injection Inject 0.3 mg into the muscle as needed for anaphylaxis.     glucose blood (ONETOUCH ULTRA) test strip Use as instructed 100 each 5   NEXIUM 40 MG capsule TAKE 1 CAPSULE (40 MG TOTAL) BY MOUTH DAILY BEFORE BREAKFAST. 30 capsule 10   NONFORMULARY OR COMPOUNDED ITEM Estradiol vaginal cream 0.02% insert vaginally twice weeky 90 each 4   rivaroxaban (XARELTO) 20 MG TABS tablet Take 1 tablet (20 mg total) by mouth daily with supper. 90 tablet 0   tirzepatide (MOUNJARO) 2.5 MG/0.5ML Pen Inject 2.5 mg into the skin once a week. 2 mL 0   zolpidem (AMBIEN) 10 MG tablet Take 1 tablet (10 mg total) by mouth at bedtime as needed for sleep. 90 tablet 0   No current facility-administered medications on file prior to visit.        ROS:  All others reviewed and  negative.  Objective        PE:  BP 136/70 (BP Location: Left Arm, Patient Position: Sitting, Cuff Size: Large)   Pulse (!) 115   Temp 98 F (36.7 C) (Oral)   Ht 5' 4.25" (1.632 m)   Wt 201 lb (91.2 kg)   LMP 03/17/1991   SpO2 98%   BMI 34.23 kg/m                 Constitutional: Pt appears in NAD               HENT: Head: NCAT.                Right Ear: External ear normal.                 Left Ear: External ear normal.                Eyes: . Pupils are equal, round, and reactive to light. Conjunctivae and EOM are normal               Nose: without d/c or deformity               Neck: Neck supple. Gross normal ROM               Cardiovascular: Normal rate and irregular rhythm.                 Pulmonary/Chest: Effort normal and breath sounds without rales with bilat mild wheezing.                Abd:  Soft, NT, ND, + BS, no organomegaly               Neurological: Pt is alert. At baseline orientation, motor grossly intact               Skin: Skin is warm. No rashes, no other new lesions, LE edema - none               Psychiatric: Pt behavior is normal without agitation   Micro: none  Cardiac tracings I have personally interpreted today:  none  Pertinent Radiological findings (summarize): none   Lab Results  Component Value Date   WBC 8.2 11/21/2020   HGB 12.4 11/21/2020   HCT 36.8 11/21/2020   PLT 257 11/21/2020   GLUCOSE 114 (H) 11/21/2020   CHOL 162 01/31/2020   TRIG 136.0 01/31/2020   HDL 45.70 01/31/2020   LDLCALC 89 01/31/2020   ALT 40 (H) 10/28/2020   AST 50 (H)  10/28/2020   NA 142 11/21/2020   K 4.9 11/21/2020   CL 105 11/21/2020   CREATININE 1.13 (H) 11/21/2020   BUN 26 11/21/2020   CO2 24 11/21/2020   TSH 0.96 10/28/2020   INR 1.1 (H) 01/31/2020   HGBA1C 7.8 (H) 10/28/2020   MICROALBUR 1.5 01/31/2020   Assessment/Plan:  Diana Reeves is a 70 y.o. White or Caucasian [1] female with  has a past medical history of A-fib ([place]), Allergic state  (12/26/2016), Basal cell carcinoma, Benign neoplasm of colon, Degenerative cervical disc, Depression, Diverticulosis of colon (without mention of hemorrhage), Esophageal reflux, Glaucoma, Osteoporosis, unspecified (12/2018), Other chronic nonalcoholic liver disease, Sinusitis (12/26/2016), and Stricture and stenosis of esophagus.  Cough Mild to mod, for antibx course, cough med prn, to f/u any worsening symptoms or concerns  Wheezing Mild to mod, for depomedrol im 80, predpac asd,  to f/u any worsening symptoms or concerns  Persistent atrial fibrillation (HCC) Stable rate and volume, continue to moinitor  Type 2 diabetes mellitus with complication, without long-term current use of insulin (HCC) Lab Results  Component Value Date   HGBA1C 7.8 (H) 10/28/2020   Stable, pt to continue current medical treatment monjauro  Followup: Return if symptoms worsen or fail to improve.  Cathlean Cower, MD 01/02/2021 8:58 PM [facility]

## 2021-01-02 NOTE — Assessment & Plan Note (Addendum)
Mild to mod, for depomedrol im 80, predpac asd,  to f/u any worsening symptoms or concerns

## 2021-01-02 NOTE — Assessment & Plan Note (Signed)
Mild to mod, for antibx course, cough med prn, to f/u any worsening symptoms or concerns

## 2021-01-09 DIAGNOSIS — J3089 Other allergic rhinitis: Secondary | ICD-10-CM | POA: Diagnosis not present

## 2021-01-10 DIAGNOSIS — H401112 Primary open-angle glaucoma, right eye, moderate stage: Secondary | ICD-10-CM | POA: Diagnosis not present

## 2021-01-10 DIAGNOSIS — H401123 Primary open-angle glaucoma, left eye, severe stage: Secondary | ICD-10-CM | POA: Diagnosis not present

## 2021-01-10 LAB — HM DIABETES EYE EXAM

## 2021-01-16 ENCOUNTER — Other Ambulatory Visit: Payer: Self-pay

## 2021-01-16 ENCOUNTER — Encounter: Payer: Self-pay | Admitting: Cardiology

## 2021-01-16 ENCOUNTER — Ambulatory Visit: Payer: Medicare PPO | Admitting: Cardiology

## 2021-01-16 VITALS — BP 128/76 | HR 82 | Ht 64.0 in | Wt 194.2 lb

## 2021-01-16 DIAGNOSIS — I10 Essential (primary) hypertension: Secondary | ICD-10-CM

## 2021-01-16 DIAGNOSIS — I4819 Other persistent atrial fibrillation: Secondary | ICD-10-CM | POA: Diagnosis not present

## 2021-01-16 DIAGNOSIS — E118 Type 2 diabetes mellitus with unspecified complications: Secondary | ICD-10-CM

## 2021-01-16 DIAGNOSIS — E668 Other obesity: Secondary | ICD-10-CM

## 2021-01-16 DIAGNOSIS — I4891 Unspecified atrial fibrillation: Secondary | ICD-10-CM

## 2021-01-16 MED ORDER — DILTIAZEM HCL ER COATED BEADS 360 MG PO CP24: 360.0000 mg | ORAL_CAPSULE | Freq: Every day | ORAL | 3 refills | Status: DC

## 2021-01-16 MED ORDER — METOPROLOL TARTRATE 50 MG PO TABS: ORAL_TABLET | ORAL | 0 refills | Status: DC

## 2021-01-16 NOTE — Progress Notes (Signed)
Electrophysiology Office Note:    Date:  01/16/2021   ID:  Diana Reeves, DOB 1951-02-19, MRN [id number]  PCP:  Janith Lima, MD  [facility] Cardiologist:  Sanda Klein, MD  [facility] Electrophysiologist:  None   Referring MD: Sanda Klein, MD   Chief Complaint: Atrial fibrillation  History of Present Illness:    Diana Reeves is a 70 y.o. female who presents for an evaluation of atrial fibrillation at the request of Dr Sallyanne Kuster. Their medical history includes depression, GERD, esophageal stricture.  The patient last saw Dr. Loletha Grayer on December 20, 2020.  The patient is symptomatic with her atrial fibrillation with a reduced exercise tolerance.  She underwent a cardioversion on September 15 for her atrial fibrillation.  She felt better when she was back in normal rhythm.  At that appointment, she returned in atrial fibrillation after running out of diltiazem 3 days prior to the appointment.  At that appointment, she expressed her desire to avoid antiarrhythmic drugs.  She is referred to discuss ablation therapy. Today she confirms the above history. She has an upcoming trip planned to explore [place].  The trip lasts for 19 days and she takes off in about 2 or 3 weeks.  She is interested in trying cardioversion again prior to the trip to see if she can get some relief during her vacation.   Past Medical History:  Diagnosis Date   A-fib [facility])    Allergic state 12/26/2016   Basal cell carcinoma    Benign neoplasm of colon    Degenerative cervical disc    Depression    Diverticulosis of colon (without mention of hemorrhage)    Esophageal reflux    Glaucoma    Osteoporosis, unspecified 12/2018   2007 T score -3.0, 2012 T score -1.9, 2015 T score -2.1 overall stable, 2018 T score -2.0, 2020 T score -1.8   Other chronic nonalcoholic liver disease    Sinusitis 12/26/2016   Stricture and stenosis of esophagus     Past Surgical History:  Procedure Laterality Date   ABDOMINAL  HYSTERECTOMY  1993   TAH-LSO-Post. repair-Burch   CARDIOVERSION N/A 11/28/2020   Procedure: CARDIOVERSION;  Surgeon: Jerline Pain, MD;  Location: [facility];  Service: Cardiovascular;  Laterality: N/A;   COLONOSCOPY     EYE SURGERY  10/18/2012   lt eye   knee arthroscop Right 2018   left arm surgery  7-09   POLYPECTOMY      Current Medications: No outpatient medications have been marked as taking for the 01/16/21 encounter (Office Visit) with Vickie Epley, MD.     Allergies:   Dust mite extract and Mold extract [trichophyton mentagrophyte]   Social History   Socioeconomic History   Marital status: Divorced    Spouse name: Not on file   Number of children: 3   Years of education: Not on file   Highest education level: Not on file  Occupational History   Occupation: retired  Tobacco Use   Smoking status: Former   Smokeless tobacco: Never   Tobacco comments:    In college  Vaping Use   Vaping Use: Never used  Substance and Sexual Activity   Alcohol use: Not Currently   Drug use: No   Sexual activity: Not Currently    Birth control/protection: Surgical    Comment: 1st intercourse 71 yo-More than 5 partner,, hysterectomy  Other Topics Concern   Not on file  Social History Narrative   Not on  file   Social Determinants of Health   Financial Resource Strain: Low Risk    Difficulty of Paying Living Expenses: Not hard at all  Food Insecurity: No Food Insecurity   Worried About Charity fundraiser in the Last Year: Never true   Arboriculturist in the Last Year: Never true  Transportation Needs: No Transportation Needs   Lack of Transportation (Medical): No   Lack of Transportation (Non-Medical): No  Physical Activity: Inactive   Days of Exercise per Week: 0 days   Minutes of Exercise per Session: 0 min  Stress: No Stress Concern Present   Feeling of Stress : Not at all  Social Connections: Unknown   Frequency of Communication with Friends and Family: More  than three times a week   Frequency of Social Gatherings with Friends and Family: More than three times a week   Attends Religious Services: More than 4 times per year   Active Member of [employer] or Organizations: No   Attends Music therapist: More than 4 times per year   Marital Status: Not on file     Family History: The patient's family history includes Cancer in her father, maternal grandmother, and son; Colon cancer (age of onset: 40) in her father; Diabetes in her paternal grandfather; Heart attack in her son; Heart disease in her mother; Hypertension in her father. There is no history of Stomach cancer or Rectal cancer.  ROS:   Please see the history of present illness.    All other systems reviewed and are negative.  EKGs/Labs/Other Studies Reviewed:    The following studies were reviewed today: Prior records  November 07, 2020 echo Left ventricular function low normal, 50% Right ventricular function normal Mild MR Normal LA size  EKG:  The ekg ordered today demonstrates atrial fibrillation with a ventricular rate of 82 bpm.   Recent Labs: 01/31/2020: Pro B Natriuretic peptide (BNP) 69.0 10/28/2020: ALT 40; TSH 0.96 11/21/2020: BUN 26; Creatinine, Ser 1.13; Hemoglobin 12.4; Platelets 257; Potassium 4.9; Sodium 142  Recent Lipid Panel    Component Value Date/Time   CHOL 162 01/31/2020 0906   TRIG 136.0 01/31/2020 0906   HDL 45.70 01/31/2020 0906   CHOLHDL 4 01/31/2020 0906   VLDL 27.2 01/31/2020 0906   LDLCALC 89 01/31/2020 0906    Physical Exam:    VS:  BP 128/76   Ht 5' 4"  (1.626 m)   Wt 194 lb 3.2 oz (88.1 kg)   LMP 03/17/1991   SpO2 95%   BMI 33.33 kg/m     Wt Readings from Last 3 Encounters:  01/16/21 194 lb 3.2 oz (88.1 kg)  01/02/21 201 lb (91.2 kg)  12/27/20 200 lb (90.7 kg)     GEN:  Well nourished, well developed in no acute distress HEENT: Normal NECK: No JVD; No carotid bruits LYMPHATICS: No lymphadenopathy CARDIAC: Irregularly  irregular, no murmurs, rubs, gallops RESPIRATORY:  Clear to auscultation without rales, wheezing or rhonchi  ABDOMEN: Soft, non-tender, non-distended MUSCULOSKELETAL:  No edema; No deformity  SKIN: Warm and dry NEUROLOGIC:  Alert and oriented x 3 PSYCHIATRIC:  Normal affect       ASSESSMENT:    1. Persistent atrial fibrillation ([place])   2. Essential hypertension, benign   3. Type 2 diabetes mellitus with complication, without long-term current use of insulin ([place])   4. Moderate obesity    PLAN:    In order of problems listed above:  #Persistent atrial fibrillation Symptomatic.  On  Xarelto for stroke prophylaxis. We discussed the management options for her atrial fibrillation including continued conservative management, rhythm control using antiarrhythmic drug therapy and rhythm control using catheter ablation.  We discussed the pros and cons of each option including the risks, efficacy and recovery.  After our discussion the patient elected to proceed with catheter ablation.  Ablation strategy will be PVI plus posterior wall ablation.  She will need a CT scan prior to the procedure.  Risk, benefits, and alternatives to EP study and radiofrequency ablation for afib were also discussed in detail today. These risks include but are not limited to stroke, bleeding, vascular damage, tamponade, perforation, damage to the esophagus, lungs, and other structures, pulmonary vein stenosis, worsening renal function, and death. The patient understands these risk and wishes to proceed.  We will therefore proceed with catheter ablation at the next available time.  Carto, ICE, anesthesia are requested for the procedure.  Will also obtain CT PV protocol prior to the procedure to exclude LAA thrombus and further evaluate atrial anatomy.  We discussed techniques to get her some immediate relief from her atrial fibrillation prior to her planned vacation in a few weeks.  We discussed starting an antiarrhythmic  drug but I do not think this is the best option with such a little time between now and her trip.  I would be concerned that she would experience off target effects when she is not in [place].  After discussion, we opted to repeat a cardioversion while increasing her Cardizem to 360 mg by mouth once a day.      Total time spent with patient today 65 minutes. This includes reviewing records, evaluating the patient and coordinating care.  Medication Adjustments/Labs and Tests Ordered: Current medicines are reviewed at length with the patient today.  Concerns regarding medicines are outlined above.  No orders of the defined types were placed in this encounter.  No orders of the defined types were placed in this encounter.    Signed, Hilton Cork. Quentin Ore, MD, [facility], [facility] 01/16/2021 3:43 PM    Electrophysiology [facility]

## 2021-01-16 NOTE — H&P (View-Only) (Signed)
Electrophysiology Office Note:    Date:  01/16/2021   ID:  Diana Reeves, DOB 1950/10/10, MRN [id number]  PCP:  Janith Lima, MD  [facility] Cardiologist:  Sanda Klein, MD  [facility] Electrophysiologist:  None   Referring MD: Sanda Klein, MD   Chief Complaint: Atrial fibrillation  History of Present Illness:    Diana Reeves is a 70 y.o. female who presents for an evaluation of atrial fibrillation at the request of Dr Sallyanne Kuster. Their medical history includes depression, GERD, esophageal stricture.  The patient last saw Dr. Loletha Grayer on December 20, 2020.  The patient is symptomatic with her atrial fibrillation with a reduced exercise tolerance.  She underwent a cardioversion on September 15 for her atrial fibrillation.  She felt better when she was back in normal rhythm.  At that appointment, she returned in atrial fibrillation after running out of diltiazem 3 days prior to the appointment.  At that appointment, she expressed her desire to avoid antiarrhythmic drugs.  She is referred to discuss ablation therapy. Today she confirms the above history. She has an upcoming trip planned to explore [place].  The trip lasts for 19 days and she takes off in about 2 or 3 weeks.  She is interested in trying cardioversion again prior to the trip to see if she can get some relief during her vacation.   Past Medical History:  Diagnosis Date   A-fib [facility])    Allergic state 12/26/2016   Basal cell carcinoma    Benign neoplasm of colon    Degenerative cervical disc    Depression    Diverticulosis of colon (without mention of hemorrhage)    Esophageal reflux    Glaucoma    Osteoporosis, unspecified 12/2018   2007 T score -3.0, 2012 T score -1.9, 2015 T score -2.1 overall stable, 2018 T score -2.0, 2020 T score -1.8   Other chronic nonalcoholic liver disease    Sinusitis 12/26/2016   Stricture and stenosis of esophagus     Past Surgical History:  Procedure Laterality Date   ABDOMINAL  HYSTERECTOMY  1993   TAH-LSO-Post. repair-Burch   CARDIOVERSION N/A 11/28/2020   Procedure: CARDIOVERSION;  Surgeon: Jerline Pain, MD;  Location: [facility];  Service: Cardiovascular;  Laterality: N/A;   COLONOSCOPY     EYE SURGERY  10/18/2012   lt eye   knee arthroscop Right 2018   left arm surgery  7-09   POLYPECTOMY      Current Medications: No outpatient medications have been marked as taking for the 01/16/21 encounter (Office Visit) with Vickie Epley, MD.     Allergies:   Dust mite extract and Mold extract [trichophyton mentagrophyte]   Social History   Socioeconomic History   Marital status: Divorced    Spouse name: Not on file   Number of children: 3   Years of education: Not on file   Highest education level: Not on file  Occupational History   Occupation: retired  Tobacco Use   Smoking status: Former   Smokeless tobacco: Never   Tobacco comments:    In college  Vaping Use   Vaping Use: Never used  Substance and Sexual Activity   Alcohol use: Not Currently   Drug use: No   Sexual activity: Not Currently    Birth control/protection: Surgical    Comment: 1st intercourse 70 yo-More than 5 partner,, hysterectomy  Other Topics Concern   Not on file  Social History Narrative   Not on  file   Social Determinants of Health   Financial Resource Strain: Low Risk    Difficulty of Paying Living Expenses: Not hard at all  Food Insecurity: No Food Insecurity   Worried About Charity fundraiser in the Last Year: Never true   Arboriculturist in the Last Year: Never true  Transportation Needs: No Transportation Needs   Lack of Transportation (Medical): No   Lack of Transportation (Non-Medical): No  Physical Activity: Inactive   Days of Exercise per Week: 0 days   Minutes of Exercise per Session: 0 min  Stress: No Stress Concern Present   Feeling of Stress : Not at all  Social Connections: Unknown   Frequency of Communication with Friends and Family: More  than three times a week   Frequency of Social Gatherings with Friends and Family: More than three times a week   Attends Religious Services: More than 4 times per year   Active Member of [employer] or Organizations: No   Attends Music therapist: More than 4 times per year   Marital Status: Not on file     Family History: The patient's family history includes Cancer in her father, maternal grandmother, and son; Colon cancer (age of onset: 24) in her father; Diabetes in her paternal grandfather; Heart attack in her son; Heart disease in her mother; Hypertension in her father. There is no history of Stomach cancer or Rectal cancer.  ROS:   Please see the history of present illness.    All other systems reviewed and are negative.  EKGs/Labs/Other Studies Reviewed:    The following studies were reviewed today: Prior records  November 07, 2020 echo Left ventricular function low normal, 50% Right ventricular function normal Mild MR Normal LA size  EKG:  The ekg ordered today demonstrates atrial fibrillation with a ventricular rate of 82 bpm.   Recent Labs: 01/31/2020: Pro B Natriuretic peptide (BNP) 69.0 10/28/2020: ALT 40; TSH 0.96 11/21/2020: BUN 26; Creatinine, Ser 1.13; Hemoglobin 12.4; Platelets 257; Potassium 4.9; Sodium 142  Recent Lipid Panel    Component Value Date/Time   CHOL 162 01/31/2020 0906   TRIG 136.0 01/31/2020 0906   HDL 45.70 01/31/2020 0906   CHOLHDL 4 01/31/2020 0906   VLDL 27.2 01/31/2020 0906   LDLCALC 89 01/31/2020 0906    Physical Exam:    VS:  BP 128/76   Ht 5' 4"  (1.626 m)   Wt 194 lb 3.2 oz (88.1 kg)   LMP 03/17/1991   SpO2 95%   BMI 33.33 kg/m     Wt Readings from Last 3 Encounters:  01/16/21 194 lb 3.2 oz (88.1 kg)  01/02/21 201 lb (91.2 kg)  12/27/20 200 lb (90.7 kg)     GEN:  Well nourished, well developed in no acute distress HEENT: Normal NECK: No JVD; No carotid bruits LYMPHATICS: No lymphadenopathy CARDIAC: Irregularly  irregular, no murmurs, rubs, gallops RESPIRATORY:  Clear to auscultation without rales, wheezing or rhonchi  ABDOMEN: Soft, non-tender, non-distended MUSCULOSKELETAL:  No edema; No deformity  SKIN: Warm and dry NEUROLOGIC:  Alert and oriented x 3 PSYCHIATRIC:  Normal affect       ASSESSMENT:    1. Persistent atrial fibrillation ([place])   2. Essential hypertension, benign   3. Type 2 diabetes mellitus with complication, without long-term current use of insulin ([place])   4. Moderate obesity    PLAN:    In order of problems listed above:  #Persistent atrial fibrillation Symptomatic.  On  Xarelto for stroke prophylaxis. We discussed the management options for her atrial fibrillation including continued conservative management, rhythm control using antiarrhythmic drug therapy and rhythm control using catheter ablation.  We discussed the pros and cons of each option including the risks, efficacy and recovery.  After our discussion the patient elected to proceed with catheter ablation.  Ablation strategy will be PVI plus posterior wall ablation.  She will need a CT scan prior to the procedure.  Risk, benefits, and alternatives to EP study and radiofrequency ablation for afib were also discussed in detail today. These risks include but are not limited to stroke, bleeding, vascular damage, tamponade, perforation, damage to the esophagus, lungs, and other structures, pulmonary vein stenosis, worsening renal function, and death. The patient understands these risk and wishes to proceed.  We will therefore proceed with catheter ablation at the next available time.  Carto, ICE, anesthesia are requested for the procedure.  Will also obtain CT PV protocol prior to the procedure to exclude LAA thrombus and further evaluate atrial anatomy.  We discussed techniques to get her some immediate relief from her atrial fibrillation prior to her planned vacation in a few weeks.  We discussed starting an antiarrhythmic  drug but I do not think this is the best option with such a little time between now and her trip.  I would be concerned that she would experience off target effects when she is not in [place].  After discussion, we opted to repeat a cardioversion while increasing her Cardizem to 360 mg by mouth once a day.      Total time spent with patient today 65 minutes. This includes reviewing records, evaluating the patient and coordinating care.  Medication Adjustments/Labs and Tests Ordered: Current medicines are reviewed at length with the patient today.  Concerns regarding medicines are outlined above.  No orders of the defined types were placed in this encounter.  No orders of the defined types were placed in this encounter.    Signed, Hilton Cork. Quentin Ore, MD, [facility], [facility] 01/16/2021 3:43 PM    Electrophysiology [facility]

## 2021-01-16 NOTE — Patient Instructions (Addendum)
Medication Instructions:  Your physician has recommended you make the following change in your medication:    INCREASE your diltiazem CD ---Take 360 mg by mouth once a day   Lab Work: None ordered. If you have labs (blood work) drawn today and your tests are completely normal, you will receive your results only by: [place] (if you have MyChart) OR A paper copy in the mail If you have any lab test that is abnormal or we need to change your treatment, we will call you to review the results.  Testing/Procedures: Your physician has recommended that you have a Cardioversion (DCCV). Electrical Cardioversion uses a jolt of electricity to your heart either through paddles or wired patches attached to your chest. This is a controlled, usually prescheduled, procedure. Defibrillation is done under light anesthesia in the hospital, and you usually go home the day of the procedure. This is done to get your heart back into a normal rhythm. You are not awake for the procedure. Please see the instruction sheet given to you today.  Your physician has recommended that you have an ablation. Catheter ablation is a medical procedure used to treat some cardiac arrhythmias (irregular heartbeats). During catheter ablation, a long, thin, flexible tube is put into a blood vessel in your groin (upper thigh), or neck. This tube is called an ablation catheter. It is then guided to your heart through the blood vessel. Radio frequency waves destroy small areas of heart tissue where abnormal heartbeats may cause an arrhythmia to start. Please see the instruction sheet given to you today.   Follow-Up: At [facility], you and your health needs are our priority.  As part of our continuing mission to provide you with exceptional heart care, we have created designated Provider Care Teams.  These Care Teams include your primary Cardiologist (physician) and Advanced Practice Providers (APPs -  Physician Assistants and Nurse  Practitioners) who all work together to provide you with the care you need, when you need it.  Your next appointment:    SEE INSTRUCTION LETTER  Cardiac Ablation Cardiac ablation is a procedure to destroy, or ablate, a small amount of heart tissue in very specific places. The heart has many electrical connections. Sometimes these connections are abnormal and can cause the heart to beat very fast or irregularly. Ablating some of the areas that cause problems can improve the heart's rhythm or return it to normal. Ablation may be done for people who: Have Wolff-Parkinson-White syndrome. Have fast heart rhythms (tachycardia). Have taken medicines for an abnormal heart rhythm (arrhythmia) that were not effective or caused side effects. Have a high-risk heartbeat that may be life-threatening. During the procedure, a small incision is made in the neck or the groin, and a long, thin tube (catheter) is inserted into the incision and moved to the heart. Small devices (electrodes) on the tip of the catheter will send out electrical currents. A type of X-ray (fluoroscopy) will be used to help guide the catheter and to provide images of the heart. Tell a health care provider about: Any allergies you have. All medicines you are taking, including vitamins, herbs, eye drops, creams, and over-the-counter medicines. Any problems you or family members have had with anesthetic medicines. Any blood disorders you have. Any surgeries you have had. Any medical conditions you have, such as kidney failure. Whether you are pregnant or may be pregnant. What are the risks? Generally, this is a safe procedure. However, problems may occur, including: Infection. Bruising and bleeding  at the catheter insertion site. Bleeding into the chest, especially into the sac that surrounds the heart. This is a serious complication. Stroke or blood clots. Damage to nearby structures or organs. Allergic reaction to medicines or  dyes. Need for a permanent pacemaker if the normal electrical system is damaged. A pacemaker is a small computer that sends electrical signals to the heart and helps your heart beat normally. The procedure not being fully effective. This may not be recognized until months later. Repeat ablation procedures are sometimes done. What happens before the procedure? Medicines Ask your health care provider about: Changing or stopping your regular medicines. This is especially important if you are taking diabetes medicines or blood thinners. Taking medicines such as aspirin and ibuprofen. These medicines can thin your blood. Do not take these medicines unless your health care provider tells you to take them. Taking over-the-counter medicines, vitamins, herbs, and supplements. General instructions Follow instructions from your health care provider about eating or drinking restrictions. Plan to have someone take you home from the hospital or clinic. If you will be going home right after the procedure, plan to have someone with you for 24 hours. Ask your health care provider what steps will be taken to prevent infection. What happens during the procedure?  An IV will be inserted into one of your veins. You will be given a medicine to help you relax (sedative). The skin on your neck or groin will be numbed. An incision will be made in your neck or your groin. A needle will be inserted through the incision and into a large vein in your neck or groin. A catheter will be inserted into the needle and moved to your heart. Dye may be injected through the catheter to help your surgeon see the area of the heart that needs treatment. Electrical currents will be sent from the catheter to ablate heart tissue in desired areas. There are three types of energy that may be used to do this: Heat (radiofrequency energy). Laser energy. Extreme cold (cryoablation). When the tissue has been ablated, the catheter will be  removed. Pressure will be held on the insertion area to prevent a lot of bleeding. A bandage (dressing) will be placed over the insertion area. The exact procedure may vary among health care providers and hospitals. What happens after the procedure? Your blood pressure, heart rate, breathing rate, and blood oxygen level will be monitored until you leave the hospital or clinic. Your insertion area will be monitored for bleeding. You will need to lie still for a few hours to ensure that you do not bleed from the insertion area. Do not drive for 24 hours or as long as told by your health care provider. Summary Cardiac ablation is a procedure to destroy, or ablate, a small amount of heart tissue using an electrical current. This procedure can improve the heart rhythm or return it to normal. Tell your health care provider about any medical conditions you may have and all medicines you are taking to treat them. This is a safe procedure, but problems may occur. Problems may include infection, bruising, damage to nearby organs or structures, or allergic reactions to medicines. Follow your health care provider's instructions about eating and drinking before the procedure. You may also be told to change or stop some of your medicines. After the procedure, do not drive for 24 hours or as long as told by your health care provider. This information is not intended to replace advice given to  you by your health care provider. Make sure you discuss any questions you have with your health care provider. Document Revised: 01/09/2019 Document Reviewed: 01/09/2019 Elsevier Patient Education  [place].

## 2021-01-17 ENCOUNTER — Telehealth: Payer: Self-pay | Admitting: Internal Medicine

## 2021-01-17 ENCOUNTER — Telehealth: Payer: Self-pay | Admitting: *Deleted

## 2021-01-17 NOTE — Telephone Encounter (Signed)
Questions answered. Forms mailed back to the patient per her request.

## 2021-01-17 NOTE — Telephone Encounter (Signed)
Patient requesting rx for anxiety  Patient states she is going on a trip and needs the rx for the plane ride  Patient states she use to take the medication a couple of years ago and does not remember the name of the anxiety medication  Patient is requesting a call back

## 2021-01-17 NOTE — Telephone Encounter (Signed)
Left a message for the patient to call back concerning paperwork she dropped off.

## 2021-01-20 ENCOUNTER — Other Ambulatory Visit: Payer: Self-pay | Admitting: Internal Medicine

## 2021-01-20 DIAGNOSIS — F40243 Fear of flying: Secondary | ICD-10-CM

## 2021-01-20 DIAGNOSIS — J3089 Other allergic rhinitis: Secondary | ICD-10-CM | POA: Diagnosis not present

## 2021-01-20 DIAGNOSIS — F418 Other specified anxiety disorders: Secondary | ICD-10-CM

## 2021-01-20 MED ORDER — CLONAZEPAM 0.5 MG PO TABS: 0.5000 mg | ORAL_TABLET | Freq: Two times a day (BID) | ORAL | 0 refills | Status: DC | PRN

## 2021-01-21 ENCOUNTER — Other Ambulatory Visit: Payer: Self-pay | Admitting: Internal Medicine

## 2021-01-21 DIAGNOSIS — I4891 Unspecified atrial fibrillation: Secondary | ICD-10-CM

## 2021-01-22 ENCOUNTER — Telehealth: Payer: Self-pay | Admitting: *Deleted

## 2021-01-22 DIAGNOSIS — I4891 Unspecified atrial fibrillation: Secondary | ICD-10-CM

## 2021-01-22 NOTE — Telephone Encounter (Signed)
Received "My Chart" message from the patient that pt needs a refill of Xarelto. Refill was sent in on 01/21/2021 by PCP. Called and spoke to pt and made her aware that a refill of Xarelto was sent in yesterday for a 90 day supply.  Pt stated that she had told the pharmacy she needed a refill yesterday but did not know one had been sent in. Informed the pt to reach out again if we could be further assistance.

## 2021-01-23 ENCOUNTER — Other Ambulatory Visit: Payer: Self-pay | Admitting: Internal Medicine

## 2021-01-23 DIAGNOSIS — I4891 Unspecified atrial fibrillation: Secondary | ICD-10-CM

## 2021-01-23 MED ORDER — RIVAROXABAN 20 MG PO TABS: 20.0000 mg | ORAL_TABLET | Freq: Every day | ORAL | 1 refills | Status: DC

## 2021-01-27 ENCOUNTER — Ambulatory Visit (HOSPITAL_COMMUNITY): Payer: Medicare PPO | Admitting: Anesthesiology

## 2021-01-27 ENCOUNTER — Encounter (HOSPITAL_COMMUNITY): Payer: Self-pay | Admitting: Internal Medicine

## 2021-01-27 ENCOUNTER — Encounter (HOSPITAL_COMMUNITY)
Admission: RE | Disposition: A | Discharge status: Home / Self Care | Payer: Self-pay | Source: Home / Self Care | Attending: Internal Medicine

## 2021-01-27 ENCOUNTER — Ambulatory Visit (HOSPITAL_COMMUNITY)
Admission: RE | Admit: 2021-01-27 | Discharge: 2021-01-27 | Disposition: A | Discharge status: Home / Self Care | Payer: Medicare PPO | Attending: Internal Medicine | Admitting: Internal Medicine

## 2021-01-27 DIAGNOSIS — I4819 Other persistent atrial fibrillation: Secondary | ICD-10-CM | POA: Insufficient documentation

## 2021-01-27 DIAGNOSIS — Z7901 Long term (current) use of anticoagulants: Secondary | ICD-10-CM | POA: Insufficient documentation

## 2021-01-27 DIAGNOSIS — I10 Essential (primary) hypertension: Secondary | ICD-10-CM | POA: Insufficient documentation

## 2021-01-27 DIAGNOSIS — K219 Gastro-esophageal reflux disease without esophagitis: Secondary | ICD-10-CM | POA: Diagnosis not present

## 2021-01-27 DIAGNOSIS — Z6833 Body mass index (BMI) 33.0-33.9, adult: Secondary | ICD-10-CM | POA: Insufficient documentation

## 2021-01-27 DIAGNOSIS — F418 Other specified anxiety disorders: Secondary | ICD-10-CM | POA: Diagnosis not present

## 2021-01-27 DIAGNOSIS — I498 Other specified cardiac arrhythmias: Secondary | ICD-10-CM | POA: Insufficient documentation

## 2021-01-27 DIAGNOSIS — I4891 Unspecified atrial fibrillation: Secondary | ICD-10-CM | POA: Diagnosis not present

## 2021-01-27 DIAGNOSIS — M810 Age-related osteoporosis without current pathological fracture: Secondary | ICD-10-CM | POA: Diagnosis not present

## 2021-01-27 HISTORY — PX: CARDIOVERSION: SHX1299

## 2021-01-27 LAB — POCT I-STAT, CHEM 8
BUN: 19 mg/dL (ref 8–23)
Calcium, Ion: 1.15 mmol/L (ref 1.15–1.40)
Chloride: 106 mmol/L (ref 98–111)
Creatinine, Ser: 0.6 mg/dL (ref 0.44–1.00)
Glucose, Bld: 141 mg/dL — ABNORMAL HIGH (ref 70–99)
HCT: 40 % (ref 36.0–46.0)
Hemoglobin: 13.6 g/dL (ref 12.0–15.0)
Potassium: 3.9 mmol/L (ref 3.5–5.1)
Sodium: 141 mmol/L (ref 135–145)
TCO2: 23 mmol/L (ref 22–32)

## 2021-01-27 SURGERY — CARDIOVERSION
Anesthesia: General

## 2021-01-27 MED ORDER — SODIUM CHLORIDE 0.9 % IV SOLN: INTRAVENOUS | Status: DC

## 2021-01-27 MED ORDER — LIDOCAINE HCL (CARDIAC) PF 100 MG/5ML IV SOSY
PREFILLED_SYRINGE | INTRAVENOUS | Status: DC | PRN
  Administered 2021-01-27: 50 mg via INTRAVENOUS

## 2021-01-27 MED ORDER — PROPOFOL 10 MG/ML IV BOLUS
INTRAVENOUS | Status: DC | PRN
  Administered 2021-01-27: 50 mg via INTRAVENOUS

## 2021-01-27 NOTE — CV Procedure (Signed)
Direct-current cardioversion.  Indication atrial fibrillation  Patient sedated by anesthesia with lidocaine and propofol intravenously.  With pads in the AP position, patient cardioverted to sinus rhythm with 200 J synchronized biphasic energy.  Procedure without complication.  Twelve-lead EKG pending.  [name] MD

## 2021-01-27 NOTE — Interval H&P Note (Signed)
History and Physical Interval Note:  01/27/2021 9:08 AM  Diana Reeves  has presented today for surgery, with the diagnosis of AFIB.  The various methods of treatment have been discussed with the patient and family. After consideration of risks, benefits and other options for treatment, the patient has consented to  Procedure(s): CARDIOVERSION (N/A) as a surgical intervention.  The patient's history has been reviewed, patient examined, no change in status, stable for surgery.  I have reviewed the patient's chart and labs.  Questions were answered to the patient's satisfaction.     [name]

## 2021-01-27 NOTE — Transfer of Care (Signed)
Immediate Anesthesia Transfer of Care Note  Patient: Diana Reeves  Procedure(s) Performed: CARDIOVERSION  Patient Location: Endoscopy Unit  Anesthesia Type:General  Level of Consciousness: awake, oriented and patient cooperative  Airway & Oxygen Therapy: Patient Spontanous Breathing and Patient connected to nasal cannula oxygen  Post-op Assessment: Report given to RN and Post -op Vital signs reviewed and stable  Post vital signs: Reviewed  Last Vitals:  Vitals Value Taken Time  BP    Temp    Pulse    Resp    SpO2      Last Pain:  Vitals:   01/27/21 0800  TempSrc: Temporal  PainSc: 0-No pain         Complications: No notable events documented.

## 2021-01-27 NOTE — Anesthesia Procedure Notes (Signed)
Procedure Name: General with mask airway Date/Time: 01/27/2021 9:35 AM Performed by: Jenne Campus, CRNA Pre-anesthesia Checklist: Patient identified, Emergency Drugs available, Suction available and Patient being monitored Patient Re-evaluated:Patient Re-evaluated prior to induction Oxygen Delivery Method: Ambu bag

## 2021-01-27 NOTE — Discharge Instructions (Signed)

## 2021-01-27 NOTE — Anesthesia Preprocedure Evaluation (Signed)
Anesthesia Evaluation  Patient identified by MRN, date of birth, ID band Patient awake    Reviewed: Allergy & Precautions, H&P , NPO status , Patient's Chart, lab work & pertinent test results  Airway Mallampati: II   Neck ROM: full    Dental   Pulmonary former smoker,    breath sounds clear to auscultation       Cardiovascular hypertension, + dysrhythmias Atrial Fibrillation  Rhythm:irregular Rate:Normal     Neuro/Psych PSYCHIATRIC DISORDERS Anxiety Depression    GI/Hepatic GERD  ,  Endo/Other  diabetes  Renal/GU      Musculoskeletal  (+) Arthritis ,   Abdominal   Peds  Hematology   Anesthesia Other Findings   Reproductive/Obstetrics                             Anesthesia Physical Anesthesia Plan  ASA: 3  Anesthesia Plan: General   Post-op Pain Management:    Induction: Intravenous  PONV Risk Score and Plan: 3 and Propofol infusion and Treatment may vary due to age or medical condition  Airway Management Planned: Nasal Cannula  Additional Equipment:   Intra-op Plan:   Post-operative Plan:   Informed Consent: I have reviewed the patients History and Physical, chart, labs and discussed the procedure including the risks, benefits and alternatives for the proposed anesthesia with the patient or authorized representative who has indicated his/her understanding and acceptance.     Dental advisory given  Plan Discussed with: CRNA and Anesthesiologist  Anesthesia Plan Comments:         Anesthesia Quick Evaluation

## 2021-01-28 ENCOUNTER — Encounter (HOSPITAL_COMMUNITY): Payer: Self-pay | Admitting: Internal Medicine

## 2021-01-28 NOTE — Anesthesia Postprocedure Evaluation (Signed)
Anesthesia Post Note  Patient: Diana Reeves  Procedure(s) Performed: CARDIOVERSION     Patient location during evaluation: Endoscopy Anesthesia Type: General Level of consciousness: awake and alert Pain management: pain level controlled Vital Signs Assessment: post-procedure vital signs reviewed and stable Respiratory status: spontaneous breathing, nonlabored ventilation, respiratory function stable and patient connected to nasal cannula oxygen Cardiovascular status: blood pressure returned to baseline and stable Postop Assessment: no apparent nausea or vomiting Anesthetic complications: no   No notable events documented.  Last Vitals:  Vitals:   01/27/21 0955 01/27/21 1005  BP: 122/69 131/77  Pulse: 73 71  Resp: 11 14  Temp:    SpO2: 99% 99%    Last Pain:  Vitals:   01/27/21 1005  TempSrc:   PainSc: 0-No pain                 Spiros Greenfeld S

## 2021-02-13 ENCOUNTER — Other Ambulatory Visit: Payer: Self-pay | Admitting: Internal Medicine

## 2021-02-13 ENCOUNTER — Telehealth: Payer: Self-pay | Admitting: Internal Medicine

## 2021-02-13 DIAGNOSIS — E785 Hyperlipidemia, unspecified: Secondary | ICD-10-CM

## 2021-02-13 MED ORDER — ROSUVASTATIN CALCIUM 5 MG PO TABS
5.0000 mg | ORAL_TABLET | Freq: Every day | ORAL | 0 refills | Status: DC
Start: 2021-02-13 — End: 2021-02-20

## 2021-02-13 NOTE — Telephone Encounter (Signed)
Tiffany from [facility] calling in  Says they recommend patient be on a statin medication due to her age & to decrease any cardiovascular issues  CB # [phone]

## 2021-02-14 NOTE — Telephone Encounter (Signed)
Called pt, LVM.   

## 2021-02-20 ENCOUNTER — Other Ambulatory Visit: Payer: Self-pay

## 2021-02-20 ENCOUNTER — Ambulatory Visit (INDEPENDENT_AMBULATORY_CARE_PROVIDER_SITE_OTHER): Payer: Medicare PPO

## 2021-02-20 ENCOUNTER — Encounter: Payer: Self-pay | Admitting: Internal Medicine

## 2021-02-20 ENCOUNTER — Ambulatory Visit (INDEPENDENT_AMBULATORY_CARE_PROVIDER_SITE_OTHER): Payer: Medicare PPO | Admitting: Internal Medicine

## 2021-02-20 VITALS — BP 136/78 | HR 70 | Temp 98.2°F | Resp 16 | Ht 64.0 in | Wt 190.0 lb

## 2021-02-20 DIAGNOSIS — R052 Subacute cough: Secondary | ICD-10-CM | POA: Diagnosis not present

## 2021-02-20 DIAGNOSIS — R059 Cough, unspecified: Secondary | ICD-10-CM | POA: Diagnosis not present

## 2021-02-20 DIAGNOSIS — E785 Hyperlipidemia, unspecified: Secondary | ICD-10-CM

## 2021-02-20 DIAGNOSIS — K7581 Nonalcoholic steatohepatitis (NASH): Secondary | ICD-10-CM

## 2021-02-20 DIAGNOSIS — J208 Acute bronchitis due to other specified organisms: Secondary | ICD-10-CM

## 2021-02-20 DIAGNOSIS — Z23 Encounter for immunization: Secondary | ICD-10-CM

## 2021-02-20 DIAGNOSIS — R1013 Epigastric pain: Secondary | ICD-10-CM

## 2021-02-20 DIAGNOSIS — K5641 Fecal impaction: Secondary | ICD-10-CM | POA: Diagnosis not present

## 2021-02-20 DIAGNOSIS — U071 COVID-19: Secondary | ICD-10-CM

## 2021-02-20 DIAGNOSIS — E118 Type 2 diabetes mellitus with unspecified complications: Secondary | ICD-10-CM

## 2021-02-20 DIAGNOSIS — K5904 Chronic idiopathic constipation: Secondary | ICD-10-CM

## 2021-02-20 DIAGNOSIS — F418 Other specified anxiety disorders: Secondary | ICD-10-CM | POA: Diagnosis not present

## 2021-02-20 DIAGNOSIS — J301 Allergic rhinitis due to pollen: Secondary | ICD-10-CM

## 2021-02-20 DIAGNOSIS — Z0000 Encounter for general adult medical examination without abnormal findings: Secondary | ICD-10-CM

## 2021-02-20 DIAGNOSIS — I4819 Other persistent atrial fibrillation: Secondary | ICD-10-CM

## 2021-02-20 DIAGNOSIS — I10 Essential (primary) hypertension: Secondary | ICD-10-CM | POA: Diagnosis not present

## 2021-02-20 DIAGNOSIS — K2100 Gastro-esophageal reflux disease with esophagitis, without bleeding: Secondary | ICD-10-CM

## 2021-02-20 LAB — LIPID PANEL
Cholesterol: 191 mg/dL (ref 0–200)
HDL: 53.9 mg/dL (ref >39.00)
LDL Cholesterol: 114 mg/dL — ABNORMAL HIGH (ref 0–99)
NonHDL: 136.63
Total CHOL/HDL Ratio: 4
Triglycerides: 114 mg/dL (ref 0.0–149.0)
VLDL: 22.8 mg/dL (ref 0.0–40.0)

## 2021-02-20 LAB — URINALYSIS, ROUTINE W REFLEX MICROSCOPIC
Bilirubin Urine: NEGATIVE
Hgb urine dipstick: NEGATIVE
Leukocytes,Ua: NEGATIVE
Nitrite: NEGATIVE
Specific Gravity, Urine: >=1.03 — AB (ref 1.000–1.030)
Total Protein, Urine: NEGATIVE
Urine Glucose: NEGATIVE
Urobilinogen, UA: 0.2 (ref 0.0–1.0)
pH: 5.5 (ref 5.0–8.0)

## 2021-02-20 LAB — HEPATIC FUNCTION PANEL
ALT: 25 U/L (ref 0–35)
AST: 27 U/L (ref 0–37)
Albumin: 4.3 g/dL (ref 3.5–5.2)
Alkaline Phosphatase: 62 U/L (ref 39–117)
Bilirubin, Direct: 0.1 mg/dL (ref 0.0–0.3)
Total Bilirubin: 0.7 mg/dL (ref 0.2–1.2)
Total Protein: 7.4 g/dL (ref 6.0–8.3)

## 2021-02-20 LAB — HEMOGLOBIN A1C: Hgb A1c MFr Bld: 6.9 % — ABNORMAL HIGH (ref 4.6–6.5)

## 2021-02-20 LAB — LIPASE: Lipase: 20 U/L (ref 11.0–59.0)

## 2021-02-20 LAB — SARS-COV-2 IGG: SARS-COV-2 IgG: 62.09

## 2021-02-20 LAB — AMYLASE: Amylase: 29 U/L (ref 27–131)

## 2021-02-20 LAB — MICROALBUMIN / CREATININE URINE RATIO
Creatinine,U: 225.5 mg/dL
Microalb Creat Ratio: 0.7 mg/g (ref 0.0–30.0)
Microalb, Ur: 1.7 mg/dL (ref 0.0–1.9)

## 2021-02-20 LAB — PROTIME-INR
INR: 1.1 ratio — ABNORMAL HIGH (ref 0.8–1.0)
Prothrombin Time: 12.5 s (ref 9.6–13.1)

## 2021-02-20 MED ORDER — TRAZODONE HCL 100 MG PO TABS: 100.0000 mg | ORAL_TABLET | Freq: Every day | ORAL | 0 refills | Status: DC

## 2021-02-20 MED ORDER — ESOMEPRAZOLE MAGNESIUM 40 MG PO CPDR: DELAYED_RELEASE_CAPSULE | ORAL | 1 refills | Status: DC

## 2021-02-20 MED ORDER — AZELASTINE HCL 0.1 % NA SOLN: 1.0000 | Freq: Two times a day (BID) | NASAL | 1 refills | Status: DC

## 2021-02-20 MED ORDER — TRULANCE 3 MG PO TABS: 1.0000 | ORAL_TABLET | Freq: Every day | ORAL | 1 refills | Status: DC

## 2021-02-20 NOTE — Patient Instructions (Signed)

## 2021-02-20 NOTE — Progress Notes (Signed)
Subjective:  Patient ID: Diana Reeves, female    DOB: 07/05/1950  Age: 70 y.o. MRN: [id number]  CC: Annual Exam, Hypertension, Hyperlipidemia, Diabetes, Cough, and Abdominal Pain  This visit occurred during the SARS-CoV-2 public health emergency.  Safety protocols were in place, including screening questions prior to the visit, additional usage of staff PPE, and extensive cleaning of exam room while observing appropriate contact time as indicated for disinfecting solutions.    HPI Diana Reeves presents for a CPX and f/up -   She was seen by someone else about 2 months ago and was prescribed an antibiotic for a sinus infection.  She continues to complain of non-productive cough, runny nose, and nasal congestion.  She is using a steroid nasal spray prescribed by her allergist.  She complains of worsening anxiety, shakiness, and an episode of vomiting yesterday - these symptoms resolve after  dose of klonopin. She has had some epigastric discomfort but no odynophagia, dysphagia, nausea, diarrhea, or melena.  She has had constipation and some straining.  She has noticed bright red blood per rectum.  She has gotten symptom relief with glycerin suppositories.  Outpatient Medications Prior to Visit  Medication Sig Dispense Refill   Azelastine-Fluticasone 137-50 MCG/ACT SUSP Place 1 spray into both nostrils daily as needed (allergies).     Blood Glucose Calibration (OT ULTRA/FASTTK CNTRL SOLN) SOLN 1 Act by In Vitro route 2 (two) times daily. 1 each 2   Blood Glucose Monitoring Suppl (ONE TOUCH ULTRA 2) w/Device KIT 1 Act by Does not apply route 2 (two) times daily. 1 kit 1   clonazePAM (KLONOPIN) 0.5 MG tablet Take 1 tablet (0.5 mg total) by mouth 2 (two) times daily as needed for anxiety. 60 tablet 0   diltiazem (CARDIZEM CD) 360 MG 24 hr capsule Take 1 capsule (360 mg total) by mouth daily. 90 capsule 3   dorzolamide-timolol (COSOPT) 22.3-6.8 MG/ML ophthalmic solution Place 1 drop into the right  eye 2 (two) times daily.     DULoxetine (CYMBALTA) 60 MG capsule TAKE 1 CAPSULE BY MOUTH DAILY 90 capsule 1   EPINEPHrine 0.3 mg/0.3 mL IJ SOAJ injection Inject 0.3 mg into the muscle as needed for anaphylaxis.     glucose blood (ONETOUCH ULTRA) test strip Use as instructed 100 each 5   metoprolol tartrate (LOPRESSOR) 50 MG tablet Take one tablet by mouth 2 hours prior to cardiac CT 1 tablet 0   NONFORMULARY OR COMPOUNDED ITEM Estradiol vaginal cream 0.02% insert vaginally twice weeky 90 each 4   NEXIUM 40 MG capsule TAKE 1 CAPSULE (40 MG TOTAL) BY MOUTH DAILY BEFORE BREAKFAST. 30 capsule 10   tirzepatide (MOUNJARO) 2.5 MG/0.5ML Pen Inject 2.5 mg into the skin once a week. 2 mL 0   zolpidem (AMBIEN) 10 MG tablet Take 1 tablet (10 mg total) by mouth at bedtime as needed for sleep. 90 tablet 0   rivaroxaban (XARELTO) 20 MG TABS tablet Take 1 tablet (20 mg total) by mouth daily with supper. 90 tablet 1   rosuvastatin (CRESTOR) 5 MG tablet Take 1 tablet (5 mg total) by mouth daily. 90 tablet 0   No facility-administered medications prior to visit.    ROS Review of Systems  Constitutional:  Positive for appetite change (poor appetite) and fatigue. Negative for fever and unexpected weight change.  HENT:  Positive for congestion, postnasal drip and rhinorrhea. Negative for facial swelling, nosebleeds, sinus pressure, sinus pain, sneezing, sore throat and tinnitus.   Respiratory:  Positive for cough. Negative for chest tightness, shortness of breath and wheezing.   Cardiovascular:  Positive for palpitations. Negative for chest pain and leg swelling.  Gastrointestinal:  Positive for abdominal pain, anal bleeding, constipation and vomiting. Negative for blood in stool, diarrhea, nausea and rectal pain.  Genitourinary: Negative.  Negative for difficulty urinating.  Musculoskeletal: Negative.  Negative for arthralgias and myalgias.  Skin: Negative.  Negative for color change and rash.  Hematological:   Negative for adenopathy. Does not bruise/bleed easily.  Psychiatric/Behavioral:  Positive for dysphoric mood. Negative for behavioral problems, confusion, self-injury, sleep disturbance and suicidal ideas. The patient is nervous/anxious. The patient is not hyperactive.    Objective:  BP 136/78 (BP Location: Left Arm, Patient Position: Sitting, Cuff Size: Large)   Pulse 70   Temp 98.2 F (36.8 C) (Oral)   Resp 16   Ht 5' 4"  (1.626 m)   Wt 190 lb (86.2 kg)   LMP 03/17/1991   SpO2 96%   BMI 32.61 kg/m   BP Readings from Last 3 Encounters:  02/20/21 136/78  01/27/21 131/77  01/16/21 128/76    Wt Readings from Last 3 Encounters:  02/20/21 190 lb (86.2 kg)  01/27/21 194 lb 3.2 oz (88.1 kg)  01/16/21 194 lb 3.2 oz (88.1 kg)    Physical Exam Vitals reviewed.  Constitutional:      Appearance: She is obese. She is not ill-appearing.  HENT:     Right Ear: Hearing, tympanic membrane, ear canal and external ear normal.     Left Ear: Hearing, tympanic membrane and ear canal normal.     Nose: Rhinorrhea present. No nasal tenderness, mucosal edema or congestion. Rhinorrhea is clear.     Right Nostril: No epistaxis.     Left Nostril: No epistaxis.     Right Turbinates: Not enlarged, swollen or pale.     Left Turbinates: Not enlarged, swollen or pale.     Right Sinus: No maxillary sinus tenderness or frontal sinus tenderness.     Left Sinus: No maxillary sinus tenderness or frontal sinus tenderness.  Eyes:     General: No scleral icterus.    Conjunctiva/sclera: Conjunctivae normal.  Cardiovascular:     Rate and Rhythm: Normal rate. Rhythm irregularly irregular.     Heart sounds: No murmur heard. Pulmonary:     Effort: No respiratory distress.     Breath sounds: No stridor. No wheezing, rhonchi or rales.  Chest:     Chest wall: No tenderness.  Abdominal:     General: Abdomen is flat. Bowel sounds are normal. There is no distension.     Palpations: There is no hepatomegaly,  splenomegaly or mass.     Tenderness: There is no abdominal tenderness. There is no guarding or rebound.     Hernia: No hernia is present.  Musculoskeletal:        General: Normal range of motion.     Cervical back: Neck supple.  Lymphadenopathy:     Cervical: No cervical adenopathy.  Skin:    General: Skin is warm and dry.     Findings: No rash.  Neurological:     General: No focal deficit present.     Mental Status: She is alert. Mental status is at baseline.  Psychiatric:        Attention and Perception: She is inattentive.        Mood and Affect: Mood is anxious. Mood is not depressed.        Speech: Speech is  tangential. Speech is not delayed.        Behavior: Behavior normal.        Thought Content: Thought content normal.        Cognition and Memory: Cognition normal.        Judgment: Judgment normal.    Lab Results  Component Value Date   WBC 8.2 11/21/2020   HGB 13.6 01/27/2021   HCT 40.0 01/27/2021   PLT 257 11/21/2020   GLUCOSE 141 (H) 01/27/2021   CHOL 191 02/20/2021   TRIG 114.0 02/20/2021   HDL 53.90 02/20/2021   LDLCALC 114 (H) 02/20/2021   ALT 25 02/20/2021   AST 27 02/20/2021   NA 141 01/27/2021   K 3.9 01/27/2021   CL 106 01/27/2021   CREATININE 0.60 01/27/2021   BUN 19 01/27/2021   CO2 24 11/21/2020   TSH 0.96 10/28/2020   INR 1.1 (H) 02/20/2021   HGBA1C 6.9 (H) 02/20/2021   MICROALBUR 1.7 02/20/2021    DG ABD ACUTE 2+V W 1V CHEST  Result Date: 02/20/2021 CLINICAL DATA:  Cough EXAM: DG ABDOMEN ACUTE WITH 1 VIEW CHEST COMPARISON:  05/27/2014 FINDINGS: Cardiac size is within normal limits. Lung fields are clear of any infiltrates or pulmonary edema. There is no pleural effusion or pneumothorax. Bowel gas pattern is nonspecific. Moderate amount of stool is seen in colon. There are no signs of fecal impaction in the rectosigmoid. No abnormal masses are seen. Ring-like coarse calcification noted overlying the spleen may suggest arterial calcification.  Dextroscoliosis is seen in the lumbar spine. Degenerative changes are noted in the lumbar spine. IMPRESSION: There is no evidence of intestinal obstruction or pneumoperitoneum. No focal pulmonary infiltrates are seen. Lumbar spondylosis. Electronically Signed   By: Elmer Picker M.D.   On: 02/20/2021 10:46     Assessment & Plan:   Diana Reeves was seen today for annual exam, hypertension, hyperlipidemia, diabetes, cough and abdominal pain.  Diagnoses and all orders for this visit:  Essential hypertension, benign- Her blood pressure is adequately well controlled. -     Urinalysis, Routine w reflex microscopic; Future -     Urinalysis, Routine w reflex microscopic  Nonalcoholic steatohepatitis (NASH)- Her LFTs are normal now. -     Protime-INR; Future -     Hepatic function panel; Future -     Hepatic function panel -     Protime-INR  Type 2 diabetes mellitus with complication, without long-term current use of insulin ([place])- Her blood sugar is adequately well controlled. -     Hemoglobin A1c; Future -     Microalbumin / creatinine urine ratio; Future -     Urinalysis, Routine w reflex microscopic; Future -     HM Diabetes Foot Exam -     Urinalysis, Routine w reflex microscopic -     Microalbumin / creatinine urine ratio -     Hemoglobin A1c  Hyperlipidemia with target LDL less than 100- LDL goal achieved. Doing well on the statin  -     Lipid panel; Future -     Lipid panel  Routine general medical examination at a health care facility- Exam completed, labs reviewed, vaccines reviewed and updated, cancer screenings are up-to-date, patient education was given.  Subacute cough- Her chest x-ray is negative for mass or infiltrate.  Her COVID antibody is very high.  I think this explains her recent symptoms.  -     DG ABD ACUTE 2+V W 1V CHEST; Future -  SARS-COV-2 IgG; Future -     SARS-COV-2 IgG  Epigastric abdominal pain- Based on her symptoms, exam, labs, and plain film she does  not have an acute abdominal process. -     Amylase; Future -     Lipase; Future -     DG ABD ACUTE 2+V W 1V CHEST; Future -     Lipase -     Amylase  Persistent atrial fibrillation ([place])- Will continue the DOAC.  Depression with anxiety- I recommended that she add trazodone to her current regimen. -     traZODone (DESYREL) 100 MG tablet; Take 1 tablet (100 mg total) by mouth at bedtime.  Seasonal allergic rhinitis due to pollen -     azelastine (ASTELIN) 0.1 % nasal spray; Place 1 spray into both nostrils 2 (two) times daily. Use in each nostril as directed  Gastroesophageal reflux disease with esophagitis without hemorrhage -     esomeprazole (NEXIUM) 40 MG capsule; TAKE 1 CAPSULE (40 MG TOTAL) BY MOUTH DAILY BEFORE BREAKFAST.  Chronic idiopathic constipation -     Plecanatide (TRULANCE) 3 MG TABS; Take 1 tablet by mouth daily.  Acute bronchitis due to [id number] virus- No complications noted.  I have discontinued Diana Reeves. Diana Reeves's zolpidem, tirzepatide, rivaroxaban, and rosuvastatin. I have also changed her NexIUM to esomeprazole. Additionally, I am having her start on traZODone, azelastine, Trulance, Shingrix, and Boostrix. Lastly, I am having her maintain her EPINEPHrine, dorzolamide-timolol, DULoxetine, Azelastine-Fluticasone, ONE TOUCH ULTRA 2, OneTouch Ultra, OT ULTRA/FASTTK CNTRL SOLN, NONFORMULARY OR COMPOUNDED ITEM, diltiazem, metoprolol tartrate, and clonazePAM.  Meds ordered this encounter  Medications   traZODone (DESYREL) 100 MG tablet    Sig: Take 1 tablet (100 mg total) by mouth at bedtime.    Dispense:  90 tablet    Refill:  0   azelastine (ASTELIN) 0.1 % nasal spray    Sig: Place 1 spray into both nostrils 2 (two) times daily. Use in each nostril as directed    Dispense:  90 mL    Refill:  1   esomeprazole (NEXIUM) 40 MG capsule    Sig: TAKE 1 CAPSULE (40 MG TOTAL) BY MOUTH DAILY BEFORE BREAKFAST.    Dispense:  90 capsule    Refill:  1   Plecanatide (TRULANCE) 3  MG TABS    Sig: Take 1 tablet by mouth daily.    Dispense:  90 tablet    Refill:  1   Zoster Vaccine Adjuvanted [facility]) injection    Sig: Inject 0.5 mLs into the muscle once for 1 dose.    Dispense:  0.5 mL    Refill:  1   Tdap (BOOSTRIX) 5-2.5-18.5 LF-MCG/0.5 injection    Sig: Inject 0.5 mLs into the muscle once for 1 dose.    Dispense:  0.5 mL    Refill:  0      Follow-up: Return in about 3 months (around 05/21/2021).  Scarlette Calico, MD

## 2021-02-22 MED ORDER — BOOSTRIX 5-2.5-18.5 LF-MCG/0.5 IM SUSP: 0.5000 mL | Freq: Once | INTRAMUSCULAR | 0 refills | Status: AC

## 2021-02-22 MED ORDER — SHINGRIX 50 MCG/0.5ML IM SUSR
0.5000 mL | Freq: Once | INTRAMUSCULAR | 1 refills | Status: AC
Start: 2021-02-22 — End: 2021-02-22

## 2021-02-23 ENCOUNTER — Encounter: Payer: Self-pay | Admitting: Internal Medicine

## 2021-02-24 ENCOUNTER — Other Ambulatory Visit: Payer: Self-pay | Admitting: Internal Medicine

## 2021-02-24 DIAGNOSIS — E118 Type 2 diabetes mellitus with unspecified complications: Secondary | ICD-10-CM

## 2021-02-24 MED ORDER — TIRZEPATIDE 5 MG/0.5ML ~~LOC~~ SOAJ: 5.0000 mg | SUBCUTANEOUS | 0 refills | Status: DC

## 2021-02-25 DIAGNOSIS — J3089 Other allergic rhinitis: Secondary | ICD-10-CM | POA: Diagnosis not present

## 2021-02-27 ENCOUNTER — Telehealth: Payer: Self-pay | Admitting: Internal Medicine

## 2021-02-27 DIAGNOSIS — J301 Allergic rhinitis due to pollen: Secondary | ICD-10-CM

## 2021-02-27 DIAGNOSIS — F418 Other specified anxiety disorders: Secondary | ICD-10-CM

## 2021-02-27 DIAGNOSIS — K5904 Chronic idiopathic constipation: Secondary | ICD-10-CM

## 2021-02-27 DIAGNOSIS — K2100 Gastro-esophageal reflux disease with esophagitis, without bleeding: Secondary | ICD-10-CM

## 2021-02-27 MED ORDER — ESOMEPRAZOLE MAGNESIUM 40 MG PO CPDR: DELAYED_RELEASE_CAPSULE | ORAL | 1 refills | Status: DC

## 2021-02-27 MED ORDER — TRAZODONE HCL 100 MG PO TABS: 100.0000 mg | ORAL_TABLET | Freq: Every day | ORAL | 0 refills | Status: DC

## 2021-02-27 MED ORDER — AZELASTINE HCL 0.1 % NA SOLN: 1.0000 | Freq: Two times a day (BID) | NASAL | 1 refills | Status: DC

## 2021-02-27 MED ORDER — TRULANCE 3 MG PO TABS: 1.0000 | ORAL_TABLET | Freq: Every day | ORAL | 1 refills | Status: DC

## 2021-02-27 NOTE — Telephone Encounter (Signed)
Rx's has been sent to [facility]. Pt informed via VM.

## 2021-02-27 NOTE — Telephone Encounter (Signed)
Patient states last medications called in was sent to the wrong pharmacy  Patient could not remember names of the medication  Patient requesting medication sent to [facility] [phone] - [place], [place]

## 2021-02-28 ENCOUNTER — Other Ambulatory Visit: Payer: Self-pay | Admitting: Internal Medicine

## 2021-02-28 ENCOUNTER — Telehealth: Payer: Self-pay

## 2021-02-28 DIAGNOSIS — K5904 Chronic idiopathic constipation: Secondary | ICD-10-CM

## 2021-02-28 MED ORDER — LUBIPROSTONE 24 MCG PO CAPS: 24.0000 ug | ORAL_CAPSULE | Freq: Two times a day (BID) | ORAL | 1 refills | Status: DC

## 2021-02-28 NOTE — Telephone Encounter (Signed)
Per CoverMyMeds: ? ?PA was denied.  ?

## 2021-02-28 NOTE — Telephone Encounter (Signed)
Diana Reeves

## 2021-03-04 ENCOUNTER — Other Ambulatory Visit: Payer: Self-pay

## 2021-03-04 ENCOUNTER — Ambulatory Visit (INDEPENDENT_AMBULATORY_CARE_PROVIDER_SITE_OTHER): Payer: Medicare PPO

## 2021-03-04 ENCOUNTER — Other Ambulatory Visit: Payer: Self-pay | Admitting: Obstetrics & Gynecology

## 2021-03-04 DIAGNOSIS — M810 Age-related osteoporosis without current pathological fracture: Secondary | ICD-10-CM

## 2021-03-04 DIAGNOSIS — Z780 Asymptomatic menopausal state: Secondary | ICD-10-CM | POA: Diagnosis not present

## 2021-03-04 DIAGNOSIS — M8589 Other specified disorders of bone density and structure, multiple sites: Secondary | ICD-10-CM

## 2021-03-05 DIAGNOSIS — J3089 Other allergic rhinitis: Secondary | ICD-10-CM | POA: Diagnosis not present

## 2021-03-14 ENCOUNTER — Telehealth: Payer: Self-pay

## 2021-03-14 NOTE — Telephone Encounter (Signed)
Key: [id number]

## 2021-03-19 DIAGNOSIS — J3089 Other allergic rhinitis: Secondary | ICD-10-CM | POA: Diagnosis not present

## 2021-04-02 ENCOUNTER — Other Ambulatory Visit: Payer: Medicare PPO | Admitting: *Deleted

## 2021-04-02 ENCOUNTER — Ambulatory Visit: Payer: Medicare PPO | Admitting: Cardiovascular Disease

## 2021-04-02 ENCOUNTER — Other Ambulatory Visit: Payer: Self-pay

## 2021-04-02 DIAGNOSIS — E118 Type 2 diabetes mellitus with unspecified complications: Secondary | ICD-10-CM

## 2021-04-02 DIAGNOSIS — I10 Essential (primary) hypertension: Secondary | ICD-10-CM

## 2021-04-02 DIAGNOSIS — I4819 Other persistent atrial fibrillation: Secondary | ICD-10-CM

## 2021-04-02 DIAGNOSIS — E668 Other obesity: Secondary | ICD-10-CM | POA: Diagnosis not present

## 2021-04-02 DIAGNOSIS — I4891 Unspecified atrial fibrillation: Secondary | ICD-10-CM

## 2021-04-02 LAB — CBC WITH DIFFERENTIAL/PLATELET
Basophils Absolute: 0 10*3/uL (ref 0.0–0.2)
Basos: 0 %
EOS (ABSOLUTE): 0.5 10*3/uL — ABNORMAL HIGH (ref 0.0–0.4)
Eos: 6 %
Hematocrit: 36.5 % (ref 34.0–46.6)
Hemoglobin: 12.5 g/dL (ref 11.1–15.9)
Lymphocytes Absolute: 2.8 10*3/uL (ref 0.7–3.1)
Lymphs: 39 %
MCH: 27.9 pg (ref 26.6–33.0)
MCHC: 34.2 g/dL (ref 31.5–35.7)
MCV: 82 fL (ref 79–97)
Monocytes Absolute: 0.6 10*3/uL (ref 0.1–0.9)
Monocytes: 8 %
Neutrophils Absolute: 3.4 10*3/uL (ref 1.4–7.0)
Neutrophils: 47 %
Platelets: 269 10*3/uL (ref 150–450)
RBC: 4.48 x10E6/uL (ref 3.77–5.28)
RDW: 15.1 % (ref 11.7–15.4)
WBC: 7.2 10*3/uL (ref 3.4–10.8)

## 2021-04-02 LAB — BASIC METABOLIC PANEL
BUN/Creatinine Ratio: 15 (ref 12–28)
BUN: 11 mg/dL (ref 8–27)
CO2: 30 mmol/L — ABNORMAL HIGH (ref 20–29)
Calcium: 9.4 mg/dL (ref 8.7–10.3)
Chloride: 103 mmol/L (ref 96–106)
Creatinine, Ser: 0.73 mg/dL (ref 0.57–1.00)
Glucose: 223 mg/dL — ABNORMAL HIGH (ref 70–99)
Potassium: 4.2 mmol/L (ref 3.5–5.2)
Sodium: 138 mmol/L (ref 134–144)
eGFR: 87 mL/min/{1.73_m2} (ref >59)

## 2021-04-07 ENCOUNTER — Other Ambulatory Visit: Payer: Self-pay | Admitting: Internal Medicine

## 2021-04-07 DIAGNOSIS — F418 Other specified anxiety disorders: Secondary | ICD-10-CM

## 2021-04-11 ENCOUNTER — Telehealth (HOSPITAL_COMMUNITY): Payer: Self-pay | Admitting: Emergency Medicine

## 2021-04-11 NOTE — Telephone Encounter (Signed)
Attempted to call patient regarding upcoming cardiac CT appointment. °Left message on voicemail with name and callback number °Sierrah Luevano RN Navigator Cardiac Imaging °[facility] °[phone] Office °[phone] Cell ° °

## 2021-04-13 ENCOUNTER — Encounter: Payer: Self-pay | Admitting: Internal Medicine

## 2021-04-13 DIAGNOSIS — E118 Type 2 diabetes mellitus with unspecified complications: Secondary | ICD-10-CM

## 2021-04-14 ENCOUNTER — Telehealth (HOSPITAL_COMMUNITY): Payer: Self-pay | Admitting: *Deleted

## 2021-04-14 ENCOUNTER — Other Ambulatory Visit: Payer: Self-pay

## 2021-04-14 ENCOUNTER — Ambulatory Visit (HOSPITAL_COMMUNITY)
Admission: RE | Admit: 2021-04-14 | Discharge: 2021-04-14 | Disposition: A | Discharge status: Home / Self Care | Payer: Medicare PPO | Source: Ambulatory Visit | Attending: Cardiology | Admitting: Cardiology

## 2021-04-14 DIAGNOSIS — I4891 Unspecified atrial fibrillation: Secondary | ICD-10-CM | POA: Insufficient documentation

## 2021-04-14 MED ORDER — IOHEXOL 350 MG/ML SOLN
80.0000 mL | Freq: Once | INTRAVENOUS | Status: AC | PRN
  Administered 2021-04-14: 80 mL via INTRAVENOUS

## 2021-04-14 MED ORDER — TIRZEPATIDE 5 MG/0.5ML ~~LOC~~ SOAJ: 5.0000 mg | SUBCUTANEOUS | 0 refills | Status: DC

## 2021-04-14 NOTE — Telephone Encounter (Signed)
Patient returning call regarding upcoming cardiac imaging study; pt verbalizes understanding of appt date/time, parking situation and where to check in, pre-test NPO status and medications ordered, and verified current allergies; name and call back number provided for further questions should they arise  Gordy Clement RN Navigator Cardiac Imaging [facility] [phone] office [phone] cell  Patient to take 51m metoprolol tartrate two hours prior to cardiac CT. She is aware to arrive at 1pm for her 1:30pm scan.

## 2021-04-16 ENCOUNTER — Telehealth: Payer: Self-pay | Admitting: Cardiology

## 2021-04-16 NOTE — Telephone Encounter (Signed)
Patient calling for the instructions on her ablation for Monday.

## 2021-04-17 ENCOUNTER — Telehealth: Payer: Self-pay | Admitting: Cardiology

## 2021-04-17 NOTE — Telephone Encounter (Signed)
Pt reaching out for ct instructions... please advise

## 2021-04-18 ENCOUNTER — Encounter: Payer: Self-pay | Admitting: Cardiology

## 2021-04-18 NOTE — Pre-Procedure Instructions (Signed)
Attempted to call patient regarding procedure instructions.  Left voice mail on the following items: Arrival time 0830 Nothing to eat or drink after midnight No meds AM of procedure Responsible person to drive you home and stay with you for 24 hrs Wash with special soap night before and morning of procedure If on anti-coagulant drug instructions- Xarelto don't take Monday morning

## 2021-04-18 NOTE — Telephone Encounter (Signed)
Patient returning call to get instructions for upcoming ablation

## 2021-04-18 NOTE — Telephone Encounter (Signed)
Pt only needs to know what time to arrive to hospital for her procedure on Monday. States she has been told 2 different times so she is requesting clarification. Pt advised to arrive at 8:30 am on Monday, and that procedure is scheduled to start at 10:30am.  Patient verbalized understanding and agreeable to plan.

## 2021-04-20 ENCOUNTER — Encounter (HOSPITAL_COMMUNITY): Payer: Self-pay | Admitting: Cardiology

## 2021-04-20 NOTE — Anesthesia Preprocedure Evaluation (Addendum)
Anesthesia Evaluation  Patient identified by MRN, date of birth, ID band Patient awake    Reviewed: Allergy & Precautions, NPO status , Patient's Chart, lab work & pertinent test results, reviewed documented beta blocker date and time   Airway Mallampati: II  TM Distance: >3 FB     Dental no notable dental hx. (+) Teeth Intact, Caps, Dental Advisory Given   Pulmonary former smoker,    Pulmonary exam normal breath sounds clear to auscultation       Cardiovascular hypertension, Pt. on medications + dysrhythmias Atrial Fibrillation  Rhythm:Irregular Rate:Normal     Neuro/Psych PSYCHIATRIC DISORDERS Anxiety Depression Glaucoma    GI/Hepatic GERD  Medicated,(+) Hepatitis -, UnspecifiedHx/o NASH Hx/o Esophageal stricture   Endo/Other  diabetes, Well Controlled, Type 2, Oral Hypoglycemic AgentsObesity HLD  Renal/GU   negative genitourinary   Musculoskeletal  (+) Arthritis , Osteoarthritis,    Abdominal (+) + obese,   Peds  Hematology Xarelto therapy- last dose noon yesterday   Anesthesia Other Findings   Reproductive/Obstetrics                            Anesthesia Physical Anesthesia Plan  ASA: 3  Anesthesia Plan: General   Post-op Pain Management:    Induction: Intravenous  PONV Risk Score and Plan: 3 and Treatment may vary due to age or medical condition and Ondansetron  Airway Management Planned: Oral ETT  Additional Equipment: None  Intra-op Plan:   Post-operative Plan: Extubation in OR  Informed Consent: I have reviewed the patients History and Physical, chart, labs and discussed the procedure including the risks, benefits and alternatives for the proposed anesthesia with the patient or authorized representative who has indicated his/her understanding and acceptance.     Dental advisory given  Plan Discussed with: CRNA and Anesthesiologist  Anesthesia Plan Comments:         Anesthesia Quick Evaluation                                  Anesthesia Evaluation  Patient identified by MRN, date of birth, ID band Patient awake    Reviewed: Allergy & Precautions, H&P , NPO status , Patient's Chart, lab work & pertinent test results  Airway Mallampati: II   Neck ROM: full    Dental   Pulmonary former smoker,    breath sounds clear to auscultation       Cardiovascular hypertension, + dysrhythmias Atrial Fibrillation  Rhythm:irregular Rate:Normal     Neuro/Psych PSYCHIATRIC DISORDERS Anxiety Depression    GI/Hepatic GERD  ,  Endo/Other  diabetes  Renal/GU      Musculoskeletal  (+) Arthritis ,   Abdominal   Peds  Hematology   Anesthesia Other Findings   Reproductive/Obstetrics                             Anesthesia Physical Anesthesia Plan  ASA: 3  Anesthesia Plan: General   Post-op Pain Management:    Induction: Intravenous  PONV Risk Score and Plan: 3 and Propofol infusion and Treatment may vary due to age or medical condition  Airway Management Planned: Nasal Cannula  Additional Equipment:   Intra-op Plan:   Post-operative Plan:   Informed Consent: I have reviewed the patients History and Physical, chart, labs and discussed the procedure including the risks, benefits and alternatives for the  proposed anesthesia with the patient or authorized representative who has indicated his/her understanding and acceptance.     Dental advisory given  Plan Discussed with: CRNA and Anesthesiologist  Anesthesia Plan Comments:         Anesthesia Quick Evaluation

## 2021-04-21 ENCOUNTER — Ambulatory Visit (HOSPITAL_COMMUNITY)
Admission: RE | Admit: 2021-04-21 | Discharge: 2021-04-21 | Disposition: A | Discharge status: Home / Self Care | Payer: Medicare PPO | Attending: Cardiology | Admitting: Cardiology

## 2021-04-21 ENCOUNTER — Other Ambulatory Visit: Payer: Self-pay

## 2021-04-21 ENCOUNTER — Ambulatory Visit (HOSPITAL_COMMUNITY): Payer: Medicare PPO | Admitting: Anesthesiology

## 2021-04-21 ENCOUNTER — Encounter (HOSPITAL_COMMUNITY): Payer: Self-pay | Admitting: Cardiology

## 2021-04-21 ENCOUNTER — Other Ambulatory Visit (HOSPITAL_COMMUNITY): Payer: Self-pay

## 2021-04-21 ENCOUNTER — Encounter (HOSPITAL_COMMUNITY)
Admission: RE | Disposition: A | Discharge status: Home / Self Care | Payer: Medicare PPO | Source: Home / Self Care | Attending: Cardiology

## 2021-04-21 DIAGNOSIS — F32A Depression, unspecified: Secondary | ICD-10-CM | POA: Insufficient documentation

## 2021-04-21 DIAGNOSIS — Z7984 Long term (current) use of oral hypoglycemic drugs: Secondary | ICD-10-CM | POA: Diagnosis not present

## 2021-04-21 DIAGNOSIS — I4819 Other persistent atrial fibrillation: Secondary | ICD-10-CM | POA: Diagnosis not present

## 2021-04-21 DIAGNOSIS — Z7901 Long term (current) use of anticoagulants: Secondary | ICD-10-CM | POA: Diagnosis not present

## 2021-04-21 DIAGNOSIS — I10 Essential (primary) hypertension: Secondary | ICD-10-CM | POA: Insufficient documentation

## 2021-04-21 DIAGNOSIS — I4891 Unspecified atrial fibrillation: Secondary | ICD-10-CM | POA: Diagnosis not present

## 2021-04-21 DIAGNOSIS — K219 Gastro-esophageal reflux disease without esophagitis: Secondary | ICD-10-CM | POA: Diagnosis not present

## 2021-04-21 DIAGNOSIS — E119 Type 2 diabetes mellitus without complications: Secondary | ICD-10-CM | POA: Insufficient documentation

## 2021-04-21 HISTORY — PX: ATRIAL FIBRILLATION ABLATION: EP1191

## 2021-04-21 LAB — POCT ACTIVATED CLOTTING TIME
Activated Clotting Time: 317 seconds
Activated Clotting Time: 366 seconds

## 2021-04-21 LAB — GLUCOSE, CAPILLARY
Glucose-Capillary: 146 mg/dL — ABNORMAL HIGH (ref 70–99)
Glucose-Capillary: 139 mg/dL — ABNORMAL HIGH (ref 70–99)

## 2021-04-21 SURGERY — ATRIAL FIBRILLATION ABLATION
Anesthesia: General

## 2021-04-21 MED ORDER — HEPARIN SODIUM (PORCINE) 1000 UNIT/ML IJ SOLN
INTRAMUSCULAR | Status: AC
  Filled 2021-04-21: qty 10

## 2021-04-21 MED ORDER — LIDOCAINE 2% (20 MG/ML) 5 ML SYRINGE
INTRAMUSCULAR | Status: DC | PRN
Start: 2021-04-21 — End: 2021-04-21
  Administered 2021-04-21: 60 mg via INTRAVENOUS

## 2021-04-21 MED ORDER — RIVAROXABAN 20 MG PO TABS
20.0000 mg | ORAL_TABLET | Freq: Every day | ORAL | Status: DC
  Administered 2021-04-21: 20 mg via ORAL
  Filled 2021-04-21: qty 1

## 2021-04-21 MED ORDER — ROCURONIUM BROMIDE 10 MG/ML (PF) SYRINGE
PREFILLED_SYRINGE | INTRAVENOUS | Status: DC | PRN
Start: 2021-04-21 — End: 2021-04-21
  Administered 2021-04-21: 60 mg via INTRAVENOUS
  Administered 2021-04-21: 20 mg via INTRAVENOUS

## 2021-04-21 MED ORDER — SODIUM CHLORIDE 0.9 % IV SOLN: INTRAVENOUS | Status: DC

## 2021-04-21 MED ORDER — HEPARIN SODIUM (PORCINE) 1000 UNIT/ML IJ SOLN
INTRAMUSCULAR | Status: DC | PRN
Start: 2021-04-21 — End: 2021-04-21
  Administered 2021-04-21: 4000 [IU] via INTRAVENOUS
  Administered 2021-04-21: 13000 [IU] via INTRAVENOUS

## 2021-04-21 MED ORDER — HEPARIN (PORCINE) IN NACL 1000-0.9 UT/500ML-% IV SOLN
INTRAVENOUS | Status: AC
  Filled 2021-04-21: qty 2000

## 2021-04-21 MED ORDER — SODIUM CHLORIDE 0.9 % IV SOLN: 250.0000 mL | INTRAVENOUS | Status: DC | PRN

## 2021-04-21 MED ORDER — DEXAMETHASONE SODIUM PHOSPHATE 10 MG/ML IJ SOLN
INTRAMUSCULAR | Status: DC | PRN
Start: 2021-04-21 — End: 2021-04-21
  Administered 2021-04-21: 5 mg via INTRAVENOUS

## 2021-04-21 MED ORDER — ISOPROTERENOL HCL 0.2 MG/ML IJ SOLN
INTRAVENOUS | Status: DC | PRN
  Administered 2021-04-21: 2 ug/min via INTRAVENOUS

## 2021-04-21 MED ORDER — ACETAMINOPHEN 325 MG PO TABS
650.0000 mg | ORAL_TABLET | ORAL | Status: DC | PRN
  Filled 2021-04-21: qty 2

## 2021-04-21 MED ORDER — SODIUM CHLORIDE 0.9% FLUSH: 3.0000 mL | INTRAVENOUS | Status: DC | PRN

## 2021-04-21 MED ORDER — SODIUM CHLORIDE 0.9% FLUSH: 3.0000 mL | Freq: Two times a day (BID) | INTRAVENOUS | Status: DC

## 2021-04-21 MED ORDER — PHENYLEPHRINE HCL-NACL 20-0.9 MG/250ML-% IV SOLN
INTRAVENOUS | Status: DC | PRN
  Administered 2021-04-21: 40 ug/min via INTRAVENOUS

## 2021-04-21 MED ORDER — HEPARIN (PORCINE) IN NACL 2-0.9 UNITS/ML
INTRAMUSCULAR | Status: AC | PRN
  Administered 2021-04-21 (×4): 500 mL

## 2021-04-21 MED ORDER — PANTOPRAZOLE SODIUM 40 MG PO TBEC
40.0000 mg | DELAYED_RELEASE_TABLET | Freq: Every day | ORAL | Status: DC
  Administered 2021-04-21: 40 mg via ORAL
  Filled 2021-04-21: qty 1

## 2021-04-21 MED ORDER — PROPOFOL 10 MG/ML IV BOLUS
INTRAVENOUS | Status: DC | PRN
  Administered 2021-04-21: 160 mg via INTRAVENOUS

## 2021-04-21 MED ORDER — COLCHICINE 0.6 MG PO TABS
0.6000 mg | ORAL_TABLET | Freq: Two times a day (BID) | ORAL | 0 refills | Status: DC
  Filled 2021-04-21: qty 10, 5d supply, fill #0

## 2021-04-21 MED ORDER — LACTATED RINGERS IV SOLN
INTRAVENOUS | Status: DC | PRN
Start: 2021-04-21 — End: 2021-04-21

## 2021-04-21 MED ORDER — PROTAMINE SULFATE 10 MG/ML IV SOLN
INTRAVENOUS | Status: DC | PRN
  Administered 2021-04-21: 35 mg via INTRAVENOUS

## 2021-04-21 MED ORDER — ONDANSETRON HCL 4 MG/2ML IJ SOLN
INTRAMUSCULAR | Status: DC | PRN
  Administered 2021-04-21: 4 mg via INTRAVENOUS

## 2021-04-21 MED ORDER — SUGAMMADEX SODIUM 200 MG/2ML IV SOLN
INTRAVENOUS | Status: DC | PRN
  Administered 2021-04-21: 200 mg via INTRAVENOUS

## 2021-04-21 MED ORDER — FENTANYL CITRATE (PF) 250 MCG/5ML IJ SOLN
INTRAMUSCULAR | Status: DC | PRN
  Administered 2021-04-21: 100 ug via INTRAVENOUS

## 2021-04-21 MED ORDER — ONDANSETRON HCL 4 MG/2ML IJ SOLN: 4.0000 mg | Freq: Four times a day (QID) | INTRAMUSCULAR | Status: DC | PRN

## 2021-04-21 MED ORDER — ISOPROTERENOL HCL 0.2 MG/ML IJ SOLN
INTRAMUSCULAR | Status: AC
  Filled 2021-04-21: qty 5

## 2021-04-21 MED ORDER — COLCHICINE 0.6 MG PO TABS
0.6000 mg | ORAL_TABLET | Freq: Two times a day (BID) | ORAL | Status: DC
  Administered 2021-04-21: 0.6 mg via ORAL
  Filled 2021-04-21: qty 1

## 2021-04-21 SURGICAL SUPPLY — 19 items
BAG SNAP BAND KOVER 36X36 (MISCELLANEOUS) ×1 IMPLANT
CATH 8FR REPROCESSED SOUNDSTAR (CATHETERS) ×2 IMPLANT
CATH 8FR SOUNDSTAR REPROCESSED (CATHETERS) IMPLANT
CATH OCTARAY 2.0 F 3-3-3-3-3 (CATHETERS) ×1 IMPLANT
CATH S CIRCA THERM PROBE 10F (CATHETERS) ×1 IMPLANT
CATH SMTCH THERMOCOOL SF DF (CATHETERS) ×1 IMPLANT
CATH WEBSTER BI DIR CS D-F CRV (CATHETERS) ×1 IMPLANT
CLOSURE PERCLOSE PROSTYLE (VASCULAR PRODUCTS) ×3 IMPLANT
COVER SWIFTLINK CONNECTOR (BAG) ×2 IMPLANT
PACK EP LATEX FREE (CUSTOM PROCEDURE TRAY) ×2
PACK EP LF (CUSTOM PROCEDURE TRAY) ×1 IMPLANT
PAD DEFIB RADIO PHYSIO CONN (PAD) ×2 IMPLANT
PATCH CARTO3 (PAD) ×1 IMPLANT
SHEATH BAYLIS TRANSSEPTAL 98CM (NEEDLE) ×1 IMPLANT
SHEATH CARTO VIZIGO SM CVD (SHEATH) ×1 IMPLANT
SHEATH PINNACLE 8F 10CM (SHEATH) ×2 IMPLANT
SHEATH PINNACLE 9F 10CM (SHEATH) ×1 IMPLANT
SHEATH PROBE COVER 6X72 (BAG) ×1 IMPLANT
TUBING SMART ABLATE COOLFLOW (TUBING) ×1 IMPLANT

## 2021-04-21 NOTE — Progress Notes (Signed)
Purewick placed, tolerated well, safety maintained

## 2021-04-21 NOTE — Anesthesia Procedure Notes (Addendum)
Procedure Name: Intubation Date/Time: 04/21/2021 11:11 AM Performed by: Kyung Rudd, CRNA Pre-anesthesia Checklist: Patient identified, Emergency Drugs available, Suction available and Patient being monitored Patient Re-evaluated:Patient Re-evaluated prior to induction Oxygen Delivery Method: Circle System Utilized Preoxygenation: Pre-oxygenation with 100% oxygen Induction Type: IV induction Ventilation: Mask ventilation without difficulty Laryngoscope Size: Mac and 3 Grade View: Grade I Tube type: Oral Tube size: 7.0 mm Number of attempts: 1 Airway Equipment and Method: Stylet Placement Confirmation: ETT inserted through vocal cords under direct vision, positive ETCO2 and breath sounds checked- equal and bilateral Secured at: 21 cm Tube secured with: Tape Dental Injury: Teeth and Oropharynx as per pre-operative assessment  Comments: Performed by Reece Agar, CRNA

## 2021-04-21 NOTE — Anesthesia Postprocedure Evaluation (Signed)
Anesthesia Post Note  Patient: Diana Reeves  Procedure(s) Performed: ATRIAL FIBRILLATION ABLATION     Patient location during evaluation: PACU Anesthesia Type: General Level of consciousness: awake and alert and oriented Pain management: pain level controlled Vital Signs Assessment: post-procedure vital signs reviewed and stable Respiratory status: spontaneous breathing, nonlabored ventilation and respiratory function stable Cardiovascular status: blood pressure returned to baseline and stable Postop Assessment: no apparent nausea or vomiting Anesthetic complications: no   No notable events documented.  Last Vitals:  Vitals:   04/21/21 1340 04/21/21 1345  BP: 123/63 (!) 119/57  Pulse: 93 92  Resp: 13 12  Temp:    SpO2: 96% 94%    Last Pain:  Vitals:   04/21/21 1326  TempSrc: Temporal  PainSc: 0-No pain                 Elvin Mccartin A.

## 2021-04-21 NOTE — Transfer of Care (Signed)
Immediate Anesthesia Transfer of Care Note  Patient: Diana Reeves  Procedure(s) Performed: ATRIAL FIBRILLATION ABLATION  Patient Location: PACU  Anesthesia Type:General  Level of Consciousness: drowsy  Airway & Oxygen Therapy: Patient Spontanous Breathing and Patient connected to nasal cannula oxygen  Post-op Assessment: Report given to RN and Post -op Vital signs reviewed and stable  Post vital signs: Reviewed and stable  Last Vitals:  Vitals Value Taken Time  BP 124/64 04/21/21 1330  Temp 37.4 C 04/21/21 1326  Pulse 92 04/21/21 1333  Resp 14 04/21/21 1333  SpO2 97 % 04/21/21 1333  Vitals shown include unvalidated device data.  Last Pain:  Vitals:   04/21/21 1326  TempSrc: Temporal  PainSc: 0-No pain         Complications: No notable events documented.

## 2021-04-21 NOTE — H&P (Signed)
Electrophysiology Office Note:     Date:  04/21/2021    ID:  Diana Reeves, DOB Dec 20, 1950, MRN [id number]   PCP:  Diana Lima, MD          [facility] Cardiologist:  Diana Klein, MD  [facility] Electrophysiologist:  None    Referring MD: Diana Klein, MD    Chief Complaint: Atrial fibrillation   History of Present Illness:     Diana Reeves is a 71 y.o. female who presents for an evaluation of atrial fibrillation at the request of Dr Sallyanne Kuster. Their medical history includes depression, GERD, esophageal stricture.  The patient last saw Dr. Loletha Reeves on December 20, 2020.  The patient is symptomatic with her atrial fibrillation with a reduced exercise tolerance.  She underwent a cardioversion on September 15 for her atrial fibrillation.  She felt better when she was back in normal rhythm.  At that appointment, she returned in atrial fibrillation after running out of diltiazem 3 days prior to the appointment.  At that appointment, she expressed her desire to avoid antiarrhythmic drugs.  She is referred to discuss ablation therapy. Today she confirms the above history. She has an upcoming trip planned to explore [place].  The trip lasts for 19 days and she takes off in about 2 or 3 weeks.  She is interested in trying cardioversion again prior to the trip to see if she can get some relief during her vacation.   Today she is doing well. Planning for PVI today.   Objective        Past Medical History:  Diagnosis Date   A-fib [facility])     Allergic state 12/26/2016   Basal cell carcinoma     Benign neoplasm of colon     Degenerative cervical disc     Depression     Diverticulosis of colon (without mention of hemorrhage)     Esophageal reflux     Glaucoma     Osteoporosis, unspecified 12/2018    2007 T score -3.0, 2012 T score -1.9, 2015 T score -2.1 overall stable, 2018 T score -2.0, 2020 T score -1.8   Other chronic nonalcoholic liver disease     Sinusitis 12/26/2016   Stricture  and stenosis of esophagus             Past Surgical History:  Procedure Laterality Date   ABDOMINAL HYSTERECTOMY   1993    TAH-LSO-Post. repair-Burch   CARDIOVERSION N/A 11/28/2020    Procedure: CARDIOVERSION;  Surgeon: Jerline Pain, MD;  Location: [facility];  Service: Cardiovascular;  Laterality: N/A;   COLONOSCOPY       EYE SURGERY   10/18/2012    lt eye   knee arthroscop Right 2018   left arm surgery   7-09   POLYPECTOMY          Current Medications: Active Medications  No outpatient medications have been marked as taking for the 01/16/21 encounter (Office Visit) with Vickie Epley, MD.        Allergies:   Dust mite extract and Mold extract [trichophyton mentagrophyte]    Social History         Socioeconomic History   Marital status: Divorced      Spouse name: Not on file   Number of children: 3   Years of education: Not on file   Highest education level: Not on file  Occupational History   Occupation: retired  Tobacco Use   Smoking status: Former  Smokeless tobacco: Never   Tobacco comments:      In college  Vaping Use   Vaping Use: Never used  Substance and Sexual Activity   Alcohol use: Not Currently   Drug use: No   Sexual activity: Not Currently      Birth control/protection: Surgical      Comment: 1st intercourse 71 yo-More than 5 partner,, hysterectomy  Other Topics Concern   Not on file  Social History Narrative   Not on file    Social Determinants of Health       Financial Resource Strain: Low Risk    Difficulty of Paying Living Expenses: Not hard at all  Food Insecurity: No Food Insecurity   Worried About Charity fundraiser in the Last Year: Never true   [place] in the Last Year: Never true  Transportation Needs: No Transportation Needs   Lack of Transportation (Medical): No   Lack of Transportation (Non-Medical): No  Physical Activity: Inactive   Days of Exercise per Week: 0 days   Minutes of Exercise per Session: 0  min  Stress: No Stress Concern Present   Feeling of Stress : Not at all  Social Connections: Unknown   Frequency of Communication with Friends and Family: More than three times a week   Frequency of Social Gatherings with Friends and Family: More than three times a week   Attends Religious Services: More than 4 times per year   Active Member of [employer] or Organizations: No   Attends Music therapist: More than 4 times per year   Marital Status: Not on file      Family History: The patient's family history includes Cancer in her father, maternal grandmother, and son; Colon cancer (age of onset: 24) in her father; Diabetes in her paternal grandfather; Heart attack in her son; Heart disease in her mother; Hypertension in her father. There is no history of Stomach cancer or Rectal cancer.   ROS:   Please see the history of present illness.    All other systems reviewed and are negative.   EKGs/Labs/Other Studies Reviewed:     The following studies were reviewed today: Prior records  November 07, 2020 echo Left ventricular function low normal, 50% Right ventricular function normal Mild MR Normal LA size   EKG:  The ekg ordered today demonstrates atrial fibrillation with a ventricular rate of 82 bpm.     Recent Labs: 01/31/2020: Pro B Natriuretic peptide (BNP) 69.0 10/28/2020: ALT 40; TSH 0.96 11/21/2020: BUN 26; Creatinine, Ser 1.13; Hemoglobin 12.4; Platelets 257; Potassium 4.9; Sodium 142  Recent Lipid Panel Labs (Brief)          Component Value Date/Time    CHOL 162 01/31/2020 0906    TRIG 136.0 01/31/2020 0906    HDL 45.70 01/31/2020 0906    CHOLHDL 4 01/31/2020 0906    VLDL 27.2 01/31/2020 0906    LDLCALC 89 01/31/2020 0906        Physical Exam:     VS:  BP 128/76    Ht 5' 4"  (1.626 m)    Wt 194 lb 3.2 oz (88.1 kg)    LMP 03/17/1991    SpO2 95%    BMI 33.33 kg/m         Wt Readings from Last 3 Encounters:  01/16/21 194 lb 3.2 oz (88.1 kg)  01/02/21 201  lb (91.2 kg)  12/27/20 200 lb (90.7 kg)      GEN:  Well nourished, well developed in no acute distress HEENT: Normal NECK: No JVD; No carotid bruits LYMPHATICS: No lymphadenopathy CARDIAC: Irregularly irregular, no murmurs, rubs, gallops RESPIRATORY:  Clear to auscultation without rales, wheezing or rhonchi  ABDOMEN: Soft, non-tender, non-distended MUSCULOSKELETAL:  No edema; No deformity  SKIN: Warm and dry NEUROLOGIC:  Alert and oriented x 3 PSYCHIATRIC:  Normal affect          Assessment     ASSESSMENT:     1. Persistent atrial fibrillation ([place])   2. Essential hypertension, benign   3. Type 2 diabetes mellitus with complication, without long-term current use of insulin ([place])   4. Moderate obesity     PLAN:     In order of problems listed above:   #Persistent atrial fibrillation Symptomatic.  On Xarelto for stroke prophylaxis. We discussed the management options for her atrial fibrillation including continued conservative management, rhythm control using antiarrhythmic drug therapy and rhythm control using catheter ablation.  We discussed the pros and cons of each option including the risks, efficacy and recovery.  After our discussion the patient elected to proceed with catheter ablation.  Ablation strategy will be PVI plus posterior wall ablation.  She will need a CT scan prior to the procedure.   Risk, benefits, and alternatives to EP study and radiofrequency ablation for afib were also discussed in detail today. These risks include but are not limited to stroke, bleeding, vascular damage, tamponade, perforation, damage to the esophagus, lungs, and other structures, pulmonary vein stenosis, worsening renal function, and death. The patient understands these risk and wishes to proceed.  We will therefore proceed with catheter ablation at the next available time.  Carto, ICE, anesthesia are requested for the procedure.  Will also obtain CT PV protocol prior to the procedure to  exclude LAA thrombus and further evaluate atrial anatomy.   We discussed techniques to get her some immediate relief from her atrial fibrillation prior to her planned vacation in a few weeks.  We discussed starting an antiarrhythmic drug but I do not think this is the best option with such a little time between now and her trip.  I would be concerned that she would experience off target effects when she is not in [place].  After discussion, we opted to repeat a cardioversion while increasing her Cardizem to 360 mg by mouth once a day.           Total time spent with patient today 65 minutes. This includes reviewing records, evaluating the patient and coordinating care.   Medication Adjustments/Labs and Tests Ordered: Current medicines are reviewed at length with the patient today.  Concerns regarding medicines are outlined above.  No orders of the defined types were placed in this encounter.   No orders of the defined types were placed in this encounter.       Signed, Hilton Cork. Quentin Ore, MD, [facility], [facility] 01/16/2021 3:43 PM    Electrophysiology [facility]      -------------------------  I have seen, examined the patient, and reviewed the above assessment and plan.    Plan for PVI today. Procedure reviewed.   Vickie Epley, MD 04/21/2021 10:32 AM

## 2021-04-22 ENCOUNTER — Encounter (HOSPITAL_COMMUNITY): Payer: Self-pay | Admitting: Cardiology

## 2021-04-23 NOTE — Telephone Encounter (Signed)
See other phone note

## 2021-04-28 DIAGNOSIS — J3089 Other allergic rhinitis: Secondary | ICD-10-CM | POA: Diagnosis not present

## 2021-04-30 DIAGNOSIS — J3089 Other allergic rhinitis: Secondary | ICD-10-CM | POA: Diagnosis not present

## 2021-05-12 DIAGNOSIS — J3089 Other allergic rhinitis: Secondary | ICD-10-CM | POA: Diagnosis not present

## 2021-05-16 DIAGNOSIS — J3089 Other allergic rhinitis: Secondary | ICD-10-CM | POA: Diagnosis not present

## 2021-05-19 ENCOUNTER — Other Ambulatory Visit: Payer: Self-pay

## 2021-05-19 ENCOUNTER — Ambulatory Visit (HOSPITAL_COMMUNITY)
Admission: RE | Admit: 2021-05-19 | Discharge: 2021-05-19 | Disposition: A | Discharge status: Home / Self Care | Payer: Medicare PPO | Source: Ambulatory Visit | Attending: Physician Assistant | Admitting: Physician Assistant

## 2021-05-19 VITALS — BP 164/80 | HR 71 | Ht 65.0 in | Wt 190.2 lb

## 2021-05-19 DIAGNOSIS — E669 Obesity, unspecified: Secondary | ICD-10-CM | POA: Diagnosis not present

## 2021-05-19 DIAGNOSIS — I10 Essential (primary) hypertension: Secondary | ICD-10-CM | POA: Insufficient documentation

## 2021-05-19 DIAGNOSIS — Z6831 Body mass index (BMI) 31.0-31.9, adult: Secondary | ICD-10-CM | POA: Diagnosis not present

## 2021-05-19 DIAGNOSIS — Z7901 Long term (current) use of anticoagulants: Secondary | ICD-10-CM | POA: Insufficient documentation

## 2021-05-19 DIAGNOSIS — E118 Type 2 diabetes mellitus with unspecified complications: Secondary | ICD-10-CM | POA: Insufficient documentation

## 2021-05-19 DIAGNOSIS — D6869 Other thrombophilia: Secondary | ICD-10-CM | POA: Insufficient documentation

## 2021-05-19 DIAGNOSIS — I4819 Other persistent atrial fibrillation: Secondary | ICD-10-CM | POA: Diagnosis not present

## 2021-05-19 NOTE — Progress Notes (Signed)
Primary Care Physician: Janith Lima, MD Primary Cardiologist: Dr Sallyanne Kuster Primary Electrophysiologist: Dr Quentin Ore Referring Physician: Dr Abel Presto is a 71 y.o. female with a history of HTN, DM, atrial fibrillation who presents for follow up in the [facility].  The patient was initially diagnosed with atrial fibrillation 10/2020 after presenting to his PCP with symptoms of SOB. Patient is on Xarelto for a CHADS2VASC score of 4. She underwent DCCV on 11/28/20 but was back out of rhythm on follow up 12/20/20. She underwent repeat DCCV on 01/27/21 and then had afib ablation with Dr Quentin Ore on 04/21/21. She has not had any symptoms of afib since the procedure. She denies CP, swallowing pain, or groin issues.   Today, she denies symptoms of palpitations, chest pain, shortness of breath, orthopnea, PND, lower extremity edema, dizziness, presyncope, syncope, snoring, daytime somnolence, bleeding, or neurologic sequela. The patient is tolerating medications without difficulties and is otherwise without complaint today.    Atrial Fibrillation Risk Factors:  she does not have symptoms or diagnosis of sleep apnea. she does not have a history of rheumatic fever.   she has a BMI of Body mass index is 31.65 kg/m.Marland Kitchen Filed Weights   05/19/21 1523  Weight: 86.3 kg    Family History  Problem Relation Age of Onset   Heart disease Mother    Cancer Father        colon   Hypertension Father    Colon cancer Father 3   Cancer Maternal Grandmother        uterine   Diabetes Paternal Grandfather    Cancer Son        Testicular cancer   Heart attack Son    Stomach cancer Neg Hx    Rectal cancer Neg Hx      Atrial Fibrillation Management history:  Previous antiarrhythmic drugs: none Previous cardioversions: 11/28/20, 01/27/21 Previous ablations: 04/21/21 CHADS2VASC score: 4 Anticoagulation history: Xarelto    Past Medical History:  Diagnosis Date    A-fib ([place])    Allergic state 12/26/2016   Basal cell carcinoma    Benign neoplasm of colon    Degenerative cervical disc    Depression    Diverticulosis of colon (without mention of hemorrhage)    Esophageal reflux    Glaucoma    Osteoporosis, unspecified 12/2018   2007 T score -3.0, 2012 T score -1.9, 2015 T score -2.1 overall stable, 2018 T score -2.0, 2020 T score -1.8   Other chronic nonalcoholic liver disease    Sinusitis 12/26/2016   Stricture and stenosis of esophagus    Past Surgical History:  Procedure Laterality Date   ABDOMINAL HYSTERECTOMY  1993   TAH-LSO-Post. repair-[facility]   ATRIAL FIBRILLATION ABLATION N/A 04/21/2021   Procedure: ATRIAL FIBRILLATION ABLATION;  Surgeon: Vickie Epley, MD;  Location: [place] [facility];  Service: Cardiovascular;  Laterality: N/A;   CARDIOVERSION N/A 11/28/2020   Procedure: CARDIOVERSION;  Surgeon: Jerline Pain, MD;  Location: [facility];  Service: Cardiovascular;  Laterality: N/A;   CARDIOVERSION N/A 01/27/2021   Procedure: CARDIOVERSION;  Surgeon: Fay Records, MD;  Location: [facility] ENDOSCOPY;  Service: Cardiovascular;  Laterality: N/A;   COLONOSCOPY     EYE SURGERY  10/18/2012   lt eye   knee arthroscop Right 2018   left arm surgery  7-09   POLYPECTOMY      Current Outpatient Medications  Medication Sig Dispense Refill   Azelastine-Fluticasone 137-50 MCG/ACT SUSP  Place 1 spray into both nostrils daily as needed (allergies).     Blood Glucose Calibration (OT ULTRA/FASTTK CNTRL SOLN) SOLN 1 Act by In Vitro route 2 (two) times daily. 1 each 2   Blood Glucose Monitoring Suppl (ONE TOUCH ULTRA 2) w/Device KIT 1 Act by Does not apply route 2 (two) times daily. 1 kit 1   clonazePAM (KLONOPIN) 0.5 MG tablet Take 1 tablet (0.5 mg total) by mouth 2 (two) times daily as needed for anxiety. 60 tablet 0   diltiazem (CARDIZEM CD) 360 MG 24 hr capsule Take 1 capsule (360 mg total) by mouth daily. 90 capsule 3   dorzolamide-timolol (COSOPT)  22.3-6.8 MG/ML ophthalmic solution Place 1 drop into the right eye 2 (two) times daily.     DULoxetine (CYMBALTA) 60 MG capsule TAKE ONE CAPSULE BY MOUTH DAILY 90 capsule 1   EPINEPHrine 0.3 mg/0.3 mL IJ SOAJ injection Inject 0.3 mg into the muscle as needed for anaphylaxis.     esomeprazole (NEXIUM) 40 MG capsule TAKE 1 CAPSULE (40 MG TOTAL) BY MOUTH DAILY BEFORE BREAKFAST. 90 capsule 1   glucose blood (ONETOUCH ULTRA) test strip Use as instructed 100 each 5   NONFORMULARY OR COMPOUNDED ITEM Estradiol vaginal cream 0.02% insert vaginally twice weeky 90 each 4   rivaroxaban (XARELTO) 20 MG TABS tablet Take 20 mg by mouth daily with supper.     tirzepatide [facility]) 5 MG/0.5ML Pen Inject 5 mg into the skin once a week. 6 mL 0   traZODone (DESYREL) 100 MG tablet Take 1 tablet (100 mg total) by mouth at bedtime. (Patient taking differently: Take 100 mg by mouth at bedtime as needed for sleep.) 90 tablet 0   No current facility-administered medications for this encounter.    Allergies  Allergen Reactions   Dust Mite Extract     Asthma like symptoms    Mold Extract [Trichophyton Mentagrophyte]     Asthma like symptoms     Social History   Socioeconomic History   Marital status: Divorced    Spouse name: Not on file   Number of children: 3   Years of education: Not on file   Highest education level: Not on file  Occupational History   Occupation: retired  Tobacco Use   Smoking status: Former   Smokeless tobacco: Never   Tobacco comments:    In college  Vaping Use   Vaping Use: Never used  Substance and Sexual Activity   Alcohol use: Not Currently   Drug use: No   Sexual activity: Not Currently    Birth control/protection: Surgical    Comment: 1st intercourse 71 yo-More than 5 partner,, hysterectomy  Other Topics Concern   Not on file  Social History Narrative   Not on file   Social Determinants of Health   Financial Resource Strain: Low Risk    Difficulty of Paying  Living Expenses: Not hard at all  Food Insecurity: No Food Insecurity   Worried About Charity fundraiser in the Last Year: Never true   [place] in the Last Year: Never true  Transportation Needs: No Transportation Needs   Lack of Transportation (Medical): No   Lack of Transportation (Non-Medical): No  Physical Activity: Inactive   Days of Exercise per Week: 0 days   Minutes of Exercise per Session: 0 min  Stress: No Stress Concern Present   Feeling of Stress : Not at all  Social Connections: Unknown   Frequency of Communication with Friends  and Family: More than three times a week   Frequency of Social Gatherings with Friends and Family: More than three times a week   Attends Religious Services: More than 4 times per year   Active Member of [employer] or Organizations: No   Attends Music therapist: More than 4 times per year   Marital Status: Not on file  Intimate Partner Violence: Not on file     ROS- All systems are reviewed and negative except as per the HPI above.  Physical Exam: Vitals:   05/19/21 1523  Weight: 86.3 kg  Height: 5' 5"  (1.651 m)    GEN- The patient is a well appearing obese female, alert and oriented x 3 today.   Head- normocephalic, atraumatic Eyes-  Sclera clear, conjunctiva pink Ears- hearing intact Oropharynx- clear Neck- supple  Lungs- Clear to ausculation bilaterally, normal work of breathing Heart- Regular rate and rhythm, no murmurs, rubs or gallops  GI- soft, NT, ND, + BS Extremities- no clubbing, cyanosis, or edema MS- no significant deformity or atrophy Skin- no rash or lesion Psych- euthymic mood, full affect Neuro- strength and sensation are intact  Wt Readings from Last 3 Encounters:  05/19/21 86.3 kg  04/21/21 87.1 kg  02/20/21 86.2 kg    EKG today demonstrates  SR Vent. rate 71 BPM PR interval 152 ms QRS duration 96 ms QT/QTcB 394/428 ms  Echo 11/07/20 demonstrated   1. Left ventricular ejection  fraction, by estimation, is 50 to 55%. The  left ventricle has low normal function. The left ventricle has no regional  wall motion abnormalities. Left ventricular diastolic function could not  be evaluated.   2. Right ventricular systolic function is normal. The right ventricular  size is normal. There is normal pulmonary artery systolic pressure.   3. The mitral valve is normal in structure. Mild mitral valve  regurgitation. No evidence of mitral stenosis.   4. The aortic valve is grossly normal. Aortic valve regurgitation is  mild. No aortic stenosis is present.   Epic records are reviewed at length today  CHA2DS2-VASc Score = 4  The patient's score is based upon: CHF History: 0 HTN History: 1 Diabetes History: 1 Stroke History: 0 Vascular Disease History: 0 Age Score: 1 Gender Score: 1       ASSESSMENT AND PLAN: 1. Persistent Atrial Fibrillation (ICD10:  I48.19) The patient's CHA2DS2-VASc score is 4, indicating a 4.8% annual risk of stroke.   S/p afib ablation 04/21/21 Patient appears to be maintaining SR. Continue Xarelto 20 mg daily with no missed doses for 3 months post ablation.  Continue diltiazem 360 mg daily  2. Secondary Hypercoagulable State (ICD10:  D68.69) The patient is at significant risk for stroke/thromboembolism based upon her CHA2DS2-VASc Score of 4.  Continue Rivaroxaban (Xarelto).   3. Obesity Body mass index is 31.65 kg/m. Lifestyle modification was discussed at length including regular exercise and weight reduction.  4. HTN Elevated today, has been well controlled at previous visits. No changes today.    Follow up with Dr Quentin Ore as scheduled.    [facility] [address] [place], [place] [address] [phone] 05/19/2021 3:33 PM

## 2021-05-20 DIAGNOSIS — J3089 Other allergic rhinitis: Secondary | ICD-10-CM | POA: Diagnosis not present

## 2021-05-26 DIAGNOSIS — J3081 Allergic rhinitis due to animal (cat) (dog) hair and dander: Secondary | ICD-10-CM | POA: Diagnosis not present

## 2021-05-26 DIAGNOSIS — J301 Allergic rhinitis due to pollen: Secondary | ICD-10-CM | POA: Diagnosis not present

## 2021-05-26 DIAGNOSIS — J3089 Other allergic rhinitis: Secondary | ICD-10-CM | POA: Diagnosis not present

## 2021-06-01 ENCOUNTER — Other Ambulatory Visit: Payer: Self-pay | Admitting: Internal Medicine

## 2021-06-01 DIAGNOSIS — F418 Other specified anxiety disorders: Secondary | ICD-10-CM

## 2021-06-02 DIAGNOSIS — J3089 Other allergic rhinitis: Secondary | ICD-10-CM | POA: Diagnosis not present

## 2021-06-09 DIAGNOSIS — J3089 Other allergic rhinitis: Secondary | ICD-10-CM | POA: Diagnosis not present

## 2021-06-17 DIAGNOSIS — J3089 Other allergic rhinitis: Secondary | ICD-10-CM | POA: Diagnosis not present

## 2021-06-17 DIAGNOSIS — J3081 Allergic rhinitis due to animal (cat) (dog) hair and dander: Secondary | ICD-10-CM | POA: Diagnosis not present

## 2021-06-17 DIAGNOSIS — J301 Allergic rhinitis due to pollen: Secondary | ICD-10-CM | POA: Diagnosis not present

## 2021-06-18 ENCOUNTER — Telehealth: Payer: Self-pay | Admitting: Internal Medicine

## 2021-06-18 NOTE — Progress Notes (Signed)
? ? ?06/19/2021 ?Hedy Camara ?[id number] ?April 25, 1950 ? ? ?ASSESSMENT AND PLAN:  ? ?Chronic idiopathic constipation ?Worse since starting Mounjario, likely contributing.  ?Can discuss with PCP stopping versus adding on miralax daily ? ?Personal history of colon polyps ?04/13/2018 colonoscopy 2 tubular adenomas 9 to 12 mm in size, 1 benign mucosa, nonbleeding internal hemorrhoids, recall 3 years (04/13/2021). Exellent prep ?Patient due for colonoscopy, wills schedule.  ?We have discussed the risks of bleeding, infection, perforation, medication reactions, and remote risk of death associated with colonoscopy. All questions were answered and the patient acknowledges these risk and wishes to proceed. ? ?Gastroesophageal reflux disease without esophagitis ?With episodes of AB pain, and N/V and sister with history of GB removal, will schedule for RUQ [place] to evaluate GB. ?she reports symptoms are not currently well controlled, and denies breakthrough reflux, burning in chest, hoarseness or cough. ?Lifestyle changes discussed, avoid NSAIDS ?Continue current medications ?Will schedule EGD to evaluate for possible H. pylori, eosinophilic esophagitis, peptic ulcer disease, or strictures. ?I discussed risks of EGD with patient today, including risk of sedation, bleeding or perforation.  ?Patient provides understanding and gave verbal consent to proceed. ? ?Nonalcoholic steatohepatitis ([place]) ?- getting RUQ [place] ?--Continue to work on risk factor modification including diet exercise and control of risk factors including blood sugars. ? ?Atrial fibrillation with rapid ventricular response ([place]) ?Patient told to hold her Xarelto for 2 days prior to time of procedure.   ?We will communicate with her prescribing physician to ensure that holding his Xarelto is acceptable for her.  ?We discussed the risk, benefits and alternatives to colonoscopy/endoscopy as well as the risk of her being off anticoagulation for the procedure and she is  agreeable and wishes to proceed.  ? ?Class 1 obesity due to excess calories with serious comorbidity and body mass index (BMI) of 32.0 to 32.9 in adult ?Continue weight loss ?She has been on mounjario (GLP1) in Oct/Nov when symptoms started.  ? ? ? ?Patient Care Team: ?Janith Lima, MD as PCP - General (Internal Medicine) ?Croitoru, Dani Gobble, MD as PCP - Cardiology (Cardiology) ?Vickie Epley, MD as PCP - Electrophysiology (Cardiology) ?Luberta Mutter, MD as Consulting Physician (Ophthalmology) ? ?History of Present Illness:  ?71 y.o. female  with a past medical history of persistent atrial fibrillation on Xarelto 20 mg once daily s/p ablation on 25/42/7062, nonalcoholic steatohepatitis, GERD with history of esophageal stricture, chronic idiopathic constipation, type 2 diabetes, obesity and others listed below, known to Dr. Silverio Decamp returns to clinic today for evaluation of recall colonoscopy, stomach discomfort, bloating. ? ?States she has no more SOB, chest pain since she had ablation  ?She traveled in Nov to [place], [place] and [place].  ?She had constipation on the trip and has not felt well since that time.  ?Feels she has incomplete emptying of her bowels since the trip.  ?She has epigastric discomfort, nausea. She is on nexium 40 mg once a day.  ?She has started to have some dysphagia.  ?She has been on mounjario (GLP1) in Oct/Nov when symptoms started.  ?Denies NSAIDS use, no ETOH. ?No melena, no hematochezia.  ?Sister with history of cholestectomy.  ? ?04/13/2018 colonoscopy 2 tubular adenomas 9 to 12 mm in size, 1 benign mucosa, nonbleeding internal hemorrhoids, recall 3 years (04/13/2021). Exellent prep ?01/02/2013 colonoscopy with Dr. Sharlett Iles showed diverticulosis, no polyps, however prior colonoscopy with history of adenomatous 5-year recall ?EGD 2011 with hiatal hernia 3 cm, multiple polyps, dilated stricture to 17 mm. ? ?CBC  04/02/2021  ?HGB 12.5 MCV 82 without evidence of anemia ?WBC 7.2  Platelets 269 ?Kidney function 04/02/2021  ?BUN 11 Cr 0.73  ?Potassium 4.2   ?LFTs 02/20/2021  ?AST 27 ALT 25 ?Alkphos 62 TBili 0.7 ?02/20/2021 LIPASE 20.0 ?10/28/2020 TSH 0.96 ? ? ?Current Medications:  ? ?Current Outpatient Medications (Endocrine & Metabolic):  ?  tirzepatide (MOUNJARO) 5 MG/0.5ML Pen, Inject 5 mg into the skin once a week. ? ?Current Outpatient Medications (Cardiovascular):  ?  diltiazem (CARDIZEM CD) 360 MG 24 hr capsule, Take 1 capsule (360 mg total) by mouth daily. ?  EPINEPHrine 0.3 mg/0.3 mL IJ SOAJ injection, Inject 0.3 mg into the muscle as needed for anaphylaxis. ? ?Current Outpatient Medications (Respiratory):  ?  Azelastine-Fluticasone 137-50 MCG/ACT SUSP, Place 1 spray into both nostrils daily as needed (allergies). ? ? ?Current Outpatient Medications (Hematological):  ?  rivaroxaban (XARELTO) 20 MG TABS tablet, Take 20 mg by mouth daily with supper. ? ?Current Outpatient Medications (Other):  ?  Blood Glucose Calibration (OT ULTRA/FASTTK CNTRL SOLN) SOLN, 1 Act by In Vitro route 2 (two) times daily. ?  Blood Glucose Monitoring Suppl (ONE TOUCH ULTRA 2) w/Device KIT, 1 Act by Does not apply route 2 (two) times daily. ?  clonazePAM (KLONOPIN) 0.5 MG tablet, Take 1 tablet (0.5 mg total) by mouth 2 (two) times daily as needed for anxiety. ?  dorzolamide-timolol (COSOPT) 22.3-6.8 MG/ML ophthalmic solution, Place 1 drop into the right eye 2 (two) times daily. ?  DULoxetine (CYMBALTA) 60 MG capsule, TAKE ONE CAPSULE BY MOUTH DAILY ?  esomeprazole (NEXIUM) 40 MG capsule, TAKE 1 CAPSULE (40 MG TOTAL) BY MOUTH DAILY BEFORE BREAKFAST. ?  glucose blood (ONETOUCH ULTRA) test strip, Use as instructed ?  NONFORMULARY OR COMPOUNDED ITEM, Estradiol vaginal cream 0.02% insert vaginally twice weeky ? ?Surgical History:  ?She  has a past surgical history that includes left arm surgery (7-09); Eye surgery (10/18/2012); Abdominal hysterectomy (1993); Colonoscopy; Polypectomy; knee arthroscop (Right, 2018);  Cardioversion (N/A, 11/28/2020); Cardioversion (N/A, 01/27/2021); and ATRIAL FIBRILLATION ABLATION (N/A, 04/21/2021). ?Family History:  ?Her family history includes Cancer in her father, maternal grandmother, and son; Colon cancer (age of onset: 52) in her father; Diabetes in her paternal grandfather; Heart attack in her son; Heart disease in her mother; Hypertension in her father. ?Social History:  ? reports that she has quit smoking. She has never used smokeless tobacco. She reports that she does not currently use alcohol. She reports that she does not use drugs. ? ?Current Medications, Allergies, Past Medical History, Past Surgical History, Family History and Social History were reviewed in [employer] record. ? ?Physical Exam: ?BP 138/72   Pulse 62   LMP 03/17/1991  ?General:   Pleasant, well developed female in no acute distress ?Heart:  Regular rate and rhythm; no murmurs ?Pulm: Clear anteriorly; no wheezing ?Abdomen:  Soft, Obese AB, Active bowel sounds. mild tenderness in the epigastrium. Without guarding and Without rebound, No organomegaly appreciated. ?Extremities:  without  edema. ?Neurologic:  Alert and  oriented x4;  No focal deficits.  ?Psych:  Cooperative. Normal mood and affect. ? ? ?Vladimir Crofts, PA-C ?06/19/21 ?

## 2021-06-18 NOTE — Telephone Encounter (Signed)
LVM for pt to rtn my call to schedule AWV with [facility]. Please schedule AWV if pt calls the office.  ?

## 2021-06-19 ENCOUNTER — Ambulatory Visit: Payer: Medicare PPO | Admitting: Physician Assistant

## 2021-06-19 ENCOUNTER — Encounter: Payer: Self-pay | Admitting: Physician Assistant

## 2021-06-19 ENCOUNTER — Other Ambulatory Visit (INDEPENDENT_AMBULATORY_CARE_PROVIDER_SITE_OTHER): Payer: Medicare PPO

## 2021-06-19 VITALS — BP 138/72 | HR 62

## 2021-06-19 DIAGNOSIS — Z6832 Body mass index (BMI) 32.0-32.9, adult: Secondary | ICD-10-CM | POA: Diagnosis not present

## 2021-06-19 DIAGNOSIS — E6609 Other obesity due to excess calories: Secondary | ICD-10-CM

## 2021-06-19 DIAGNOSIS — K219 Gastro-esophageal reflux disease without esophagitis: Secondary | ICD-10-CM

## 2021-06-19 DIAGNOSIS — I4891 Unspecified atrial fibrillation: Secondary | ICD-10-CM | POA: Diagnosis not present

## 2021-06-19 DIAGNOSIS — K7581 Nonalcoholic steatohepatitis (NASH): Secondary | ICD-10-CM

## 2021-06-19 DIAGNOSIS — K5904 Chronic idiopathic constipation: Secondary | ICD-10-CM | POA: Diagnosis not present

## 2021-06-19 DIAGNOSIS — Z86010 Personal history of colonic polyps: Secondary | ICD-10-CM

## 2021-06-19 LAB — COMPREHENSIVE METABOLIC PANEL
ALT: 21 U/L (ref 0–35)
AST: 29 U/L (ref 0–37)
Albumin: 4.3 g/dL (ref 3.5–5.2)
Alkaline Phosphatase: 66 U/L (ref 39–117)
BUN: 11 mg/dL (ref 6–23)
CO2: 25 mEq/L (ref 19–32)
Calcium: 9.4 mg/dL (ref 8.4–10.5)
Chloride: 104 mEq/L (ref 96–112)
Creatinine, Ser: 0.81 mg/dL (ref 0.40–1.20)
GFR: 72.21 mL/min (ref >60.00)
Glucose, Bld: 109 mg/dL — ABNORMAL HIGH (ref 70–99)
Potassium: 3.5 mEq/L (ref 3.5–5.1)
Sodium: 139 mEq/L (ref 135–145)
Total Bilirubin: 0.5 mg/dL (ref 0.2–1.2)
Total Protein: 7 g/dL (ref 6.0–8.3)

## 2021-06-19 LAB — CBC WITH DIFFERENTIAL/PLATELET
Basophils Absolute: 0 10*3/uL (ref 0.0–0.1)
Basophils Relative: 0.1 % (ref 0.0–3.0)
Eosinophils Absolute: 0.2 10*3/uL (ref 0.0–0.7)
Eosinophils Relative: 2.4 % (ref 0.0–5.0)
HCT: 40.1 % (ref 36.0–46.0)
Hemoglobin: 13.5 g/dL (ref 12.0–15.0)
Lymphocytes Relative: 34.9 % (ref 12.0–46.0)
Lymphs Abs: 2.4 10*3/uL (ref 0.7–4.0)
MCHC: 33.7 g/dL (ref 30.0–36.0)
MCV: 80.6 fl (ref 78.0–100.0)
Monocytes Absolute: 0.4 10*3/uL (ref 0.1–1.0)
Monocytes Relative: 6.2 % (ref 3.0–12.0)
Neutro Abs: 4 10*3/uL (ref 1.4–7.7)
Neutrophils Relative %: 56.4 % (ref 43.0–77.0)
Platelets: 289 10*3/uL (ref 150.0–400.0)
RBC: 4.97 Mil/uL (ref 3.87–5.11)
RDW: 15.2 % (ref 11.5–15.5)
WBC: 7 10*3/uL (ref 4.0–10.5)

## 2021-06-19 LAB — LIPASE: Lipase: 29 U/L (ref 11.0–59.0)

## 2021-06-19 MED ORDER — NA SULFATE-K SULFATE-MG SULF 17.5-3.13-1.6 GM/177ML PO SOLN: 1.0000 | Freq: Once | ORAL | 0 refills | Status: AC

## 2021-06-19 NOTE — Patient Instructions (Addendum)
If you are age 73 or older, your body mass index should be between 23-30. Your There is no height or weight on file to calculate BMI. If this is out of the aforementioned range listed, please consider follow up with your Primary Care Provider. ? ?If you are age 86 or younger, your body mass index should be between 19-25. Your There is no height or weight on file to calculate BMI. If this is out of the aformentioned range listed, please consider follow up with your Primary Care Provider.  ? ?________________________________________________________ ? ?The [facility] providers would like to encourage you to use [facility] to communicate with providers for non-urgent requests or questions.  Due to long hold times on the telephone, sending your provider a message by [facility] may be a faster and more efficient way to get a response.  Please allow 48 business hours for a response.  Please remember that this is for non-urgent requests.  ?_______________________________________________________ ? ?You have been scheduled for an endoscopy and colonoscopy. Please follow the written instructions given to you at your visit today. ?Please pick up your prep supplies at the pharmacy within the next 1-3 days. ?If you use inhalers (even only as needed), please bring them with you on the day of your procedure. ? ?Your provider has requested that you go to the basement level for lab work before leaving today. Press "B" on the elevator. The lab is located at the first door on the left as you exit the elevator. ? ?You will be contacted by [facility] in the next 2 days to arrange an abdominal ultrasound  The number on your caller ID will be [phone], please answer when they call.  If you have not heard from them in 2 days please call [phone] to schedule.   ? ? ?Recommend starting on a fiber supplement, can try metamucil first but if this causes gas/bloating switch to benefiber ?Take with fiber with with a full 8 oz  glass of water once a day. This can take 1 month to start helping, so try for at least one month.  ?Recommend increasing water and activity.  ?Get a squatty potty to use at home or try a stool, goal is to get your knees above your hips during a bowel movement. ? ?CAN TRY TO INCREASE NEXIUM TO TWICE A DAY ?CAN DO TRIAL OF FDGARD ? ?- Drink at least 64-80 ounces of water/liquid per day. ?- Establish a time to try to move your bowels every day.  For many people, this is after a cup of coffee or after a meal such as breakfast. ?- Sit all of the way back on the toilet keeping your back fairly straight and while sitting up, try to rest the tops of your forearms on your upper thighs.   ?- Raising your feet with a step stool/squatty potty can be helpful to improve the angle that allows your stool to pass through the rectum. ?- Relax the rectum feeling it bulge toward the toilet water.  If you feel your rectum raising toward your body, you are contracting rather than relaxing. ?- Breathe in and slowly exhale. "Belly breath" by expanding your belly towards your belly button. Keep belly expanded as you gently direct pressure down and back to the anus.  A low pitched GRRR sound can assist with increasing intra-abdominal pressure.  ?- Repeat 3-4 times. If unsuccessful, contract the pelvic floor to restore normal tone and get off the toilet.  Avoid excessive  straining. ?- To reduce excessive wiping by teaching your anus to normally contract, place hands on outer aspect of knees and resist knee movement outward.  Hold 5-10 second then place hands just inside of knees and resist inward movement of knees.  Hold 5 seconds.  Repeat a few times each way. ? ?Go to the ER if unable to pass gas, severe AB pain, unable to hold down food, any shortness of breath of chest pain.  ? ?Miralax is an osmotic laxative.  ?It only brings more water into the stool.  ?This is safe to take daily.  ?Can take up to 17 gram of miralax twice a day.  ?Mix  with juice or coffee.  ?Start 1 capful at night for 3-4 days and reassess your response in 3-4 days.  ?You can increase and decrease the dose based on your response.  ?Remember, it can take up to 3-4 days to take effect OR for the effects to wear off.  ? ?I often pair this with benefiber in the morning to help assure the stool is not too loose.  ? ? ?Gastroparesis ?Please do small frequent meals like 4-6 meals a day.  ?Eat and drink liquids at separate times.  ?Avoid high fiber foods, cook your vegetables, avoid high fat food.  ?Suggest spreading protein throughout the day (greek yogurt, glucerna, soft meat, milk, eggs) ?Choose soft foods that you can mash with a fork ?When you are more symptomatic, change to pureed foods foods and liquids.  ?Consider reading "Living well with Gastroparesis" by Lambert Keto ?Gastroparesis is a condition in which food takes longer than normal to empty from the stomach. This condition is also known as delayed gastric emptying. It is usually a long-term (chronic) condition. ?There is no cure, but there are treatments and things that you can do at home to help relieve symptoms. Treating the underlying condition that causes gastroparesis can also help relieve symptoms ?What are the causes? ?In many cases, the cause of this condition is not known. Possible causes include: ?A hormone (endocrine) disorder, such as hypothyroidism or diabetes. ?A nervous system disease, such as Parkinson's disease or multiple sclerosis. ?Cancer, infection, or surgery that affects the stomach or vagus nerve. The vagus nerve runs from your chest, through your neck, and to the lower part of your brain. ?A connective tissue disorder, such as scleroderma. ?Certain medicines. ?What increases the risk? ?You are more likely to develop this condition if: ?You have certain disorders or diseases. These may include: ?An endocrine disorder. ?An eating disorder. ?Amyloidosis. ?Scleroderma. ?Parkinson's  disease. ?Multiple sclerosis. ?Cancer or infection of the stomach or the vagus nerve. ?You have had surgery on your stomach or vagus nerve. ?You take certain medicines. ?You are female. ?What are the signs or symptoms? ?Symptoms of this condition include: ?Feeling full after eating very little or a loss of appetite. ?Nausea, vomiting, or heartburn. ?Bloating of your abdomen. ?Inconsistent blood sugar (glucose) levels on blood tests. ?Unexplained weight loss. ?Acid from the stomach coming up into the esophagus (gastroesophageal reflux). ?Sudden tightening (spasm) of the stomach, which can be painful. ?Symptoms may come and go. Some people may not notice any symptoms. ?How is this diagnosed? ?This condition is diagnosed with tests, such as: ?Tests that check how long it takes food to move through the stomach and intestines. These tests include: ?Upper gastrointestinal (GI) series. For this test, you drink a liquid that shows up well on X-rays, and then X-rays are taken of your intestines. ?Gastric emptying  scintigraphy. For this test, you eat food that contains a small amount of radioactive material, and then scans are taken. ?Wireless capsule GI monitoring system. For this test, you swallow a pill (capsule) that records information about how foods and fluid move through your stomach. ?Gastric manometry. For this test, a tube is passed down your throat and into your stomach to measure electrical and muscular activity. ?Endoscopy. For this test, a long, thin tube with a camera and light on the end is passed down your throat and into your stomach to check for problems in your stomach lining. ?Ultrasound. This test uses sound waves to create images of the inside of your body. This can help rule out gallbladder disease or pancreatitis as a cause of your symptoms. ?How is this treated? ?There is no cure for this condition, but treatment and home care may relieve symptoms. Treatment may include: ?Treating the underlying  cause. ?Managing your symptoms by making changes to your diet and exercise habits. ?Taking medicines to control nausea and vomiting and to stimulate stomach muscles. ?Getting food through a feeding tube in the hospit

## 2021-06-19 NOTE — Progress Notes (Signed)
Reviewed and agree with documentation and assessment and plan. K. Veena Lorin Gawron , MD   

## 2021-06-28 ENCOUNTER — Other Ambulatory Visit: Payer: Self-pay | Admitting: Internal Medicine

## 2021-06-28 ENCOUNTER — Encounter: Payer: Self-pay | Admitting: Internal Medicine

## 2021-06-28 ENCOUNTER — Encounter: Payer: Self-pay | Admitting: Gastroenterology

## 2021-06-28 DIAGNOSIS — F40243 Fear of flying: Secondary | ICD-10-CM

## 2021-06-30 ENCOUNTER — Other Ambulatory Visit: Payer: Self-pay | Admitting: Internal Medicine

## 2021-06-30 ENCOUNTER — Other Ambulatory Visit: Payer: Self-pay

## 2021-06-30 MED ORDER — CLENPIQ 10-3.5-12 MG-GM -GM/160ML PO SOLN: ORAL | 0 refills | Status: DC

## 2021-06-30 MED ORDER — CLONAZEPAM 0.5 MG PO TABS: 0.5000 mg | ORAL_TABLET | Freq: Two times a day (BID) | ORAL | 0 refills | Status: DC | PRN

## 2021-07-01 DIAGNOSIS — J3089 Other allergic rhinitis: Secondary | ICD-10-CM | POA: Diagnosis not present

## 2021-07-07 ENCOUNTER — Ambulatory Visit (HOSPITAL_COMMUNITY)
Admission: RE | Admit: 2021-07-07 | Discharge: 2021-07-07 | Disposition: A | Discharge status: Home / Self Care | Payer: Medicare PPO | Source: Ambulatory Visit | Attending: Physician Assistant | Admitting: Physician Assistant

## 2021-07-07 DIAGNOSIS — R112 Nausea with vomiting, unspecified: Secondary | ICD-10-CM | POA: Diagnosis not present

## 2021-07-07 DIAGNOSIS — K219 Gastro-esophageal reflux disease without esophagitis: Secondary | ICD-10-CM | POA: Insufficient documentation

## 2021-07-08 ENCOUNTER — Other Ambulatory Visit: Payer: Self-pay | Admitting: Internal Medicine

## 2021-07-08 DIAGNOSIS — E118 Type 2 diabetes mellitus with unspecified complications: Secondary | ICD-10-CM

## 2021-07-08 DIAGNOSIS — J3081 Allergic rhinitis due to animal (cat) (dog) hair and dander: Secondary | ICD-10-CM | POA: Diagnosis not present

## 2021-07-08 DIAGNOSIS — J3089 Other allergic rhinitis: Secondary | ICD-10-CM | POA: Diagnosis not present

## 2021-07-08 DIAGNOSIS — J301 Allergic rhinitis due to pollen: Secondary | ICD-10-CM | POA: Diagnosis not present

## 2021-07-15 DIAGNOSIS — E119 Type 2 diabetes mellitus without complications: Secondary | ICD-10-CM | POA: Diagnosis not present

## 2021-07-15 DIAGNOSIS — H401412 Capsular glaucoma with pseudoexfoliation of lens, right eye, moderate stage: Secondary | ICD-10-CM | POA: Diagnosis not present

## 2021-07-15 DIAGNOSIS — H401423 Capsular glaucoma with pseudoexfoliation of lens, left eye, severe stage: Secondary | ICD-10-CM | POA: Diagnosis not present

## 2021-07-15 DIAGNOSIS — H52203 Unspecified astigmatism, bilateral: Secondary | ICD-10-CM | POA: Diagnosis not present

## 2021-07-15 LAB — HM DIABETES EYE EXAM

## 2021-07-22 NOTE — Progress Notes (Deleted)
?Electrophysiology Office Follow up Visit Note:   ? ?Date:  07/22/2021  ? ?ID:  Diana Reeves, DOB 09/16/1950, MRN [id number] ? ?PCP:  Janith Lima, MD  ?[facility] Cardiologist:  Sanda Klein, MD  ?[facility] Electrophysiologist:  Vickie Epley, MD  ? ? ?Interval History:   ? ?Diana Reeves is a 71 y.o. female who presents for a follow up visit after his AF ablation on 04/21/2021. During the ablation, the veins and posterior wall were ablated. She saw Audry Pili in the AF clinic for follow up on 05/19/2021 and was doing well at that appointment without recurrence. ? ? ? ?  ? ?Past Medical History:  ?Diagnosis Date  ? A-fib ([place])   ? Allergic state 12/26/2016  ? Basal cell carcinoma   ? Benign neoplasm of colon   ? Degenerative cervical disc   ? Depression   ? Diverticulosis of colon (without mention of hemorrhage)   ? Esophageal reflux   ? Glaucoma   ? Osteoporosis, unspecified 12/2018  ? 2007 T score -3.0, 2012 T score -1.9, 2015 T score -2.1 overall stable, 2018 T score -2.0, 2020 T score -1.8  ? Other chronic nonalcoholic liver disease   ? Sinusitis 12/26/2016  ? Stricture and stenosis of esophagus   ? ? ?Past Surgical History:  ?Procedure Laterality Date  ? ABDOMINAL HYSTERECTOMY  1993  ? TAH-LSO-Post. repair-[place]  ? ATRIAL FIBRILLATION ABLATION N/A 04/21/2021  ? Procedure: ATRIAL FIBRILLATION ABLATION;  Surgeon: Vickie Epley, MD;  Location: [facility];  Service: Cardiovascular;  Laterality: N/A;  ? CARDIOVERSION N/A 11/28/2020  ? Procedure: CARDIOVERSION;  Surgeon: Jerline Pain, MD;  Location: [facility] ENDOSCOPY;  Service: Cardiovascular;  Laterality: N/A;  ? CARDIOVERSION N/A 01/27/2021  ? Procedure: CARDIOVERSION;  Surgeon: Fay Records, MD;  Location: [facility];  Service: Cardiovascular;  Laterality: N/A;  ? COLONOSCOPY    ? EYE SURGERY  10/18/2012  ? lt eye  ? knee arthroscop Right 2018  ? left arm surgery  7-09  ? POLYPECTOMY    ? ? ?Current Medications: ?No outpatient medications have  been marked as taking for the 07/23/21 encounter (Appointment) with Vickie Epley, MD.  ?  ? ?Allergies:   Dust mite extract and Mold extract [trichophyton mentagrophyte]  ? ?Social History  ? ?Socioeconomic History  ? Marital status: Divorced  ?  Spouse name: Not on file  ? Number of children: 3  ? Years of education: Not on file  ? Highest education level: Not on file  ?Occupational History  ? Occupation: retired  ?Tobacco Use  ? Smoking status: Former  ? Smokeless tobacco: Never  ? Tobacco comments:  ?  In college  ?Vaping Use  ? Vaping Use: Never used  ?Substance and Sexual Activity  ? Alcohol use: Not Currently  ? Drug use: No  ? Sexual activity: Not Currently  ?  Birth control/protection: Surgical  ?  Comment: 1st intercourse 71 yo-More than 5 partner,, hysterectomy  ?Other Topics Concern  ? Not on file  ?Social History Narrative  ? Not on file  ? ?Social Determinants of Health  ? ?Financial Resource Strain: Not on file  ?Food Insecurity: Not on file  ?Transportation Needs: Not on file  ?Physical Activity: Not on file  ?Stress: Not on file  ?Social Connections: Not on file  ?  ? ?Family History: ?The patient's family history includes Cancer in her father, maternal grandmother, and son; Colon cancer (age of onset: 32)  in her father; Diabetes in her paternal grandfather; Heart attack in her son; Heart disease in her mother; Hypertension in her father. There is no history of Stomach cancer or Rectal cancer. ? ?ROS:   ?Please see the history of present illness.    ?All other systems reviewed and are negative. ? ?EKGs/Labs/Other Studies Reviewed:   ? ?The following studies were reviewed today: ? ? ?EKG:  The ekg ordered today demonstrates *** ? ?Recent Labs: ?10/28/2020: TSH 0.96 ?06/19/2021: ALT 21; BUN 11; Creatinine, Ser 0.81; Hemoglobin 13.5; Platelets 289.0; Potassium 3.5; Sodium 139  ?Recent Lipid Panel ?   ?Component Value Date/Time  ? CHOL 191 02/20/2021 1018  ? TRIG 114.0 02/20/2021 1018  ? HDL 53.90  02/20/2021 1018  ? CHOLHDL 4 02/20/2021 1018  ? VLDL 22.8 02/20/2021 1018  ? LDLCALC 114 (H) 02/20/2021 1018  ? ? ?Physical Exam:   ? ?VS:  LMP 03/17/1991    ? ?Wt Readings from Last 3 Encounters:  ?05/19/21 190 lb 3.2 oz (86.3 kg)  ?04/21/21 192 lb (87.1 kg)  ?02/20/21 190 lb (86.2 kg)  ?  ? ?GEN: *** Well nourished, well developed in no acute distress ?HEENT: Normal ?NECK: No JVD; No carotid bruits ?LYMPHATICS: No lymphadenopathy ?CARDIAC: ***RRR, no murmurs, rubs, gallops ?RESPIRATORY:  Clear to auscultation without rales, wheezing or rhonchi  ?ABDOMEN: Soft, non-tender, non-distended ?MUSCULOSKELETAL:  No edema; No deformity  ?SKIN: Warm and dry ?NEUROLOGIC:  Alert and oriented x 3 ?PSYCHIATRIC:  Normal affect  ? ? ? ?  ? ?ASSESSMENT:   ? ?1. Persistent atrial fibrillation ([place])   ? ?PLAN:   ? ?In order of problems listed above: ? ? ? ? ? ? ? ? ? ? ?Total time spent with patient today *** minutes. This includes reviewing records, evaluating the patient and coordinating care.  ? ?Medication Adjustments/Labs and Tests Ordered: ?Current medicines are reviewed at length with the patient today.  Concerns regarding medicines are outlined above.  ?No orders of the defined types were placed in this encounter. ? ?No orders of the defined types were placed in this encounter. ? ? ? ?Signed, ?Lars Mage, MD, [facility], [place] ?07/22/2021 10:49 PM    ?Electrophysiology ?[place] ?

## 2021-07-23 ENCOUNTER — Encounter: Payer: Self-pay | Admitting: Cardiology

## 2021-07-23 ENCOUNTER — Ambulatory Visit: Payer: Medicare PPO | Admitting: Cardiology

## 2021-07-23 VITALS — BP 126/76 | HR 64 | Ht 65.0 in | Wt 187.0 lb

## 2021-07-23 DIAGNOSIS — I4819 Other persistent atrial fibrillation: Secondary | ICD-10-CM | POA: Diagnosis not present

## 2021-07-23 MED ORDER — DILTIAZEM HCL ER COATED BEADS 180 MG PO CP24: 180.0000 mg | ORAL_CAPSULE | Freq: Every day | ORAL | 3 refills | Status: DC

## 2021-07-23 NOTE — Progress Notes (Signed)
?Electrophysiology Office Follow up Visit Note:   ? ?Date:  07/23/2021  ? ?ID:  Diana Reeves, DOB November 26, 1950, MRN [id number] ? ?PCP:  Janith Lima, MD  ?[facility] Cardiologist:  Sanda Klein, MD  ?[facility] Electrophysiologist:  Vickie Epley, MD  ? ? ?Interval History:   ? ?Diana Reeves is a 71 y.o. female who presents for a follow up visit after his AF ablation on 04/21/2021. During the ablation, the veins and posterior wall were ablated. She saw Audry Pili in the AF clinic for follow up on 05/19/2021 and was doing well at that appointment without recurrence. ? ?Today, she is feeling good and denies any recurrent arrhythmic episodes. She has felt much better, no longer suffering from dizzy spells. She is able to work in the yard without significant exertional symptoms. ? ?However, she is concerned about her bilateral LE edema that developed since her diltiazem was doubled. She notes that she never had issues with ankle edema previously. By the morning she does notice improvement in her edema. ? ?She does not monitor her BP at home. Remains compliant and tolerant of Xarelto. ? ?She denies any palpitations, chest pain, shortness of breath. No lightheadedness, headaches, syncope, orthopnea, or PND. ? ? ?  ? ?Past Medical History:  ?Diagnosis Date  ? A-fib ([place])   ? Allergic state 12/26/2016  ? Basal cell carcinoma   ? Benign neoplasm of colon   ? Degenerative cervical disc   ? Depression   ? Diverticulosis of colon (without mention of hemorrhage)   ? Esophageal reflux   ? Glaucoma   ? Osteoporosis, unspecified 12/2018  ? 2007 T score -3.0, 2012 T score -1.9, 2015 T score -2.1 overall stable, 2018 T score -2.0, 2020 T score -1.8  ? Other chronic nonalcoholic liver disease   ? Sinusitis 12/26/2016  ? Stricture and stenosis of esophagus   ? ? ?Past Surgical History:  ?Procedure Laterality Date  ? ABDOMINAL HYSTERECTOMY  1993  ? TAH-LSO-Post. repair-[place]  ? ATRIAL FIBRILLATION ABLATION N/A 04/21/2021  ?  Procedure: ATRIAL FIBRILLATION ABLATION;  Surgeon: Vickie Epley, MD;  Location: [place] [facility];  Service: Cardiovascular;  Laterality: N/A;  ? CARDIOVERSION N/A 11/28/2020  ? Procedure: CARDIOVERSION;  Surgeon: Jerline Pain, MD;  Location: [facility] ENDOSCOPY;  Service: Cardiovascular;  Laterality: N/A;  ? CARDIOVERSION N/A 01/27/2021  ? Procedure: CARDIOVERSION;  Surgeon: Fay Records, MD;  Location: [place];  Service: Cardiovascular;  Laterality: N/A;  ? COLONOSCOPY    ? EYE SURGERY  10/18/2012  ? lt eye  ? knee arthroscop Right 2018  ? left arm surgery  7-09  ? POLYPECTOMY    ? ? ?Current Medications: ?Current Meds  ?Medication Sig  ? Azelastine-Fluticasone 137-50 MCG/ACT SUSP Place 1 spray into both nostrils daily as needed (allergies).  ? Blood Glucose Calibration (OT ULTRA/FASTTK CNTRL SOLN) SOLN 1 Act by In Vitro route 2 (two) times daily.  ? Blood Glucose Monitoring Suppl (ONE TOUCH ULTRA 2) w/Device KIT 1 Act by Does not apply route 2 (two) times daily.  ? clonazePAM (KLONOPIN) 0.5 MG tablet Take 1 tablet (0.5 mg total) by mouth 2 (two) times daily as needed for anxiety.  ? diltiazem (CARDIZEM CD) 360 MG 24 hr capsule Take 1 capsule (360 mg total) by mouth daily.  ? dorzolamide-timolol (COSOPT) 22.3-6.8 MG/ML ophthalmic solution Place 1 drop into the right eye 2 (two) times daily.  ? DULoxetine (CYMBALTA) 60 MG capsule TAKE ONE CAPSULE BY  MOUTH DAILY  ? EPINEPHrine 0.3 mg/0.3 mL IJ SOAJ injection Inject 0.3 mg into the muscle as needed for anaphylaxis.  ? esomeprazole (NEXIUM) 40 MG capsule TAKE 1 CAPSULE (40 MG TOTAL) BY MOUTH DAILY BEFORE BREAKFAST.  ? glucose blood (ONETOUCH ULTRA) test strip Use as instructed  ? NONFORMULARY OR COMPOUNDED ITEM Estradiol vaginal cream 0.02% insert vaginally twice weeky  ? rivaroxaban (XARELTO) 20 MG TABS tablet Take 20 mg by mouth daily with supper.  ?  ? ?Allergies:   Dust mite extract and Mold extract [trichophyton mentagrophyte]  ? ?Social History   ? ?Socioeconomic History  ? Marital status: Divorced  ?  Spouse name: Not on file  ? Number of children: 3  ? Years of education: Not on file  ? Highest education level: Not on file  ?Occupational History  ? Occupation: retired  ?Tobacco Use  ? Smoking status: Former  ? Smokeless tobacco: Never  ? Tobacco comments:  ?  In college  ?Vaping Use  ? Vaping Use: Never used  ?Substance and Sexual Activity  ? Alcohol use: Not Currently  ? Drug use: No  ? Sexual activity: Not Currently  ?  Birth control/protection: Surgical  ?  Comment: 1st intercourse 71 yo-More than 5 partner,, hysterectomy  ?Other Topics Concern  ? Not on file  ?Social History Narrative  ? Not on file  ? ?Social Determinants of Health  ? ?Financial Resource Strain: Not on file  ?Food Insecurity: Not on file  ?Transportation Needs: Not on file  ?Physical Activity: Not on file  ?Stress: Not on file  ?Social Connections: Not on file  ?  ? ?Family History: ?The patient's family history includes Cancer in her father, maternal grandmother, and son; Colon cancer (age of onset: 73) in her father; Diabetes in her paternal grandfather; Heart attack in her son; Heart disease in her mother; Hypertension in her father. There is no history of Stomach cancer or Rectal cancer. ? ?ROS:   ?Please see the history of present illness.    ?(+) Bilateral LE edema ?All other systems reviewed and are negative. ? ?EKGs/Labs/Other Studies Reviewed:   ? ?The following studies were reviewed today: ? ?04/21/2021  Atrial Fibrillation Ablation ?CONCLUSIONS: ?1. Successful PVI ?2. Successful ablation/isolation of the posterior wall ?3. Intracardiac echo reveals trivial pericardial effusion and normal left atrial architecture ?4. No early apparent complications. ?5.  Colchicine 0.6 mg by mouth twice daily for 5 days ?6.  Continue home PPI ? ?04/14/2021  Cardiac CTA ?IMPRESSION: ?1. There is normal pulmonary vein drainage into the left atrium with ?ostial measurements above. ?  ?2. There is  no thrombus in the left atrial appendage. ?  ?3. The esophagus runs in the left atrial midline and is not in ?proximity to any of the pulmonary vein ostia. ?  ?4. No PFO/ASD. ?  ?5. Normal coronary origin. Right dominance. ?  ?6. CAC score of 95.4 Which is 67th percentile for age-, race-, and ?sex-matched controls. ? ?11/07/2020  Echo ? 1. Left ventricular ejection fraction, by estimation, is 50 to 55%. The  ?left ventricle has low normal function. The left ventricle has no regional  ?wall motion abnormalities. Left ventricular diastolic function could not  ?be evaluated.  ? 2. Right ventricular systolic function is normal. The right ventricular  ?size is normal. There is normal pulmonary artery systolic pressure.  ? 3. The mitral valve is normal in structure. Mild mitral valve  ?regurgitation. No evidence of mitral stenosis.  ? 4.  The aortic valve is grossly normal. Aortic valve regurgitation is  ?mild. No aortic stenosis is present.  ? ?06/07/2020  Bilateral LE Venous Doppler  ?Summary:  ?RIGHT:  ?- There is no evidence of deep vein thrombosis in the lower extremity.  ?- There is no evidence of superficial venous thrombosis.  ?   ?- No cystic structure found in the popliteal fossa.  ?   ?LEFT:  ?- There is no evidence of deep vein thrombosis in the lower extremity.  ?- There is no evidence of superficial venous thrombosis.  ?   ?- A cystic structure is found in the popliteal fossa. ? ? ?EKG:  EKG is personally reviewed. ?07/23/2021: sinus rhythm ? ?Recent Labs: ?10/28/2020: TSH 0.96 ?06/19/2021: ALT 21; BUN 11; Creatinine, Ser 0.81; Hemoglobin 13.5; Platelets 289.0; Potassium 3.5; Sodium 139  ? ?Recent Lipid Panel ?   ?Component Value Date/Time  ? CHOL 191 02/20/2021 1018  ? TRIG 114.0 02/20/2021 1018  ? HDL 53.90 02/20/2021 1018  ? CHOLHDL 4 02/20/2021 1018  ? VLDL 22.8 02/20/2021 1018  ? LDLCALC 114 (H) 02/20/2021 1018  ? ? ?Physical Exam:   ? ?VS:  BP 126/76   Pulse 64   Ht 5' 5" (1.651 m)   Wt 187 lb (84.8 kg)    LMP 03/17/1991   SpO2 96%   BMI 31.12 kg/m?    ? ?Wt Readings from Last 3 Encounters:  ?07/23/21 187 lb (84.8 kg)  ?05/19/21 190 lb 3.2 oz (86.3 kg)  ?04/21/21 192 lb (87.1 kg)  ?  ? ?GEN: Well nourished, well developed

## 2021-07-23 NOTE — Patient Instructions (Addendum)
Medication Instructions:  ?Reduce Diltiazem to 180 mg daily ?Your physician recommends that you continue on your current medications as directed. Please refer to the Current Medication list given to you today. ?*If you need a refill on your cardiac medications before your next appointment, please call your pharmacy* ? ?Lab Work: ?None. ?If you have labs (blood work) drawn today and your tests are completely normal, you will receive your results only by: ?MyChart Message (if you have MyChart) OR ?A paper copy in the mail ?If you have any lab test that is abnormal or we need to change your treatment, we will call you to review the results. ? ?Testing/Procedures: ?None. ? ?Follow-Up: ?At [facility], you and your health needs are our priority.  As part of our continuing mission to provide you with exceptional heart care, we have created designated Provider Care Teams.  These Care Teams include your primary Cardiologist (physician) and Advanced Practice Providers (APPs -  Physician Assistants and Nurse Practitioners) who all work together to provide you with the care you need, when you need it. ? ?Your physician wants you to follow-up in: 12 months with Lars Mage, MD or one of the following Advanced Practice Providers on your designated Care Team:   ? ?Tommye Standard, PA-C ?Legrand Como "Jonni Sanger" [place], PA-C ?  You will receive a reminder letter in the mail two months in advance. If you don't receive a letter, please call our office to schedule the follow-up appointment. ? ?We recommend signing up for the patient portal called "MyChart".  Sign up information is provided on this After Visit Summary.  MyChart is used to connect with patients for Virtual Visits (Telemedicine).  Patients are able to view lab/test results, encounter notes, upcoming appointments, etc.  Non-urgent messages can be sent to your provider as well.   ?To learn more about what you can do with MyChart, go to [email or web address].   ? ?Any Other  Special Instructions Will Be Listed Below (If Applicable). ? ? ? ? ?  ? ? ?

## 2021-07-25 DIAGNOSIS — J3089 Other allergic rhinitis: Secondary | ICD-10-CM | POA: Diagnosis not present

## 2021-07-29 DIAGNOSIS — J3089 Other allergic rhinitis: Secondary | ICD-10-CM | POA: Diagnosis not present

## 2021-08-12 DIAGNOSIS — J3089 Other allergic rhinitis: Secondary | ICD-10-CM | POA: Diagnosis not present

## 2021-08-12 DIAGNOSIS — J301 Allergic rhinitis due to pollen: Secondary | ICD-10-CM | POA: Diagnosis not present

## 2021-08-12 DIAGNOSIS — J3081 Allergic rhinitis due to animal (cat) (dog) hair and dander: Secondary | ICD-10-CM | POA: Diagnosis not present

## 2021-08-18 ENCOUNTER — Encounter: Payer: Self-pay | Admitting: Gastroenterology

## 2021-08-19 DIAGNOSIS — H401412 Capsular glaucoma with pseudoexfoliation of lens, right eye, moderate stage: Secondary | ICD-10-CM | POA: Diagnosis not present

## 2021-08-19 DIAGNOSIS — H401423 Capsular glaucoma with pseudoexfoliation of lens, left eye, severe stage: Secondary | ICD-10-CM | POA: Diagnosis not present

## 2021-08-20 DIAGNOSIS — J301 Allergic rhinitis due to pollen: Secondary | ICD-10-CM | POA: Diagnosis not present

## 2021-08-21 ENCOUNTER — Telehealth: Payer: Self-pay

## 2021-08-21 NOTE — Telephone Encounter (Signed)
[facility] Pre-operative Risk Assessment     Request for surgical clearance:     Endoscopy Procedure  What type of surgery is being performed?     Endo/colon   When is this surgery scheduled?     08/25/2021 (!!!!!!!)  I forgot to send this to get clearance.  What type of clearance is required ?   Pharmacy  Are there any medications that need to be held prior to surgery and how long? Xarelto - 2 days  Practice name and name of physician performing surgery?      [facility]  What is your office phone and fax number?      Phone- [phone]  [phone]  Anesthesia type (None, local, MAC, general) ?       MAC  Please respond ASAP so I can communicate with patient tomorrow.

## 2021-08-21 NOTE — Telephone Encounter (Signed)
Pt has an endo/colonoscopy scheduled with Dr. Silverio Decamp on 08/25/21.  Xarelto is listed on pt's med list.  No cardiac clearance can be found.  Is pt taking Xarelto and does she need a cardiac clearance before scheduled procedure?

## 2021-08-21 NOTE — Telephone Encounter (Signed)
I am so sorry - it appears this slipped through the cracks when the procedure was scheduled. - I just sent the request for clearance to the pre op pool asking for it ASAP so I can contact the patient tomorrow - I'll let you know when I've gotten the clearance and told the patient.  Sorry!!!!

## 2021-08-22 NOTE — Telephone Encounter (Signed)
    Patient Name: Diana Reeves  DOB: 05-Mar-1951 MRN: [id number]  Primary Cardiologist: Sanda Klein, MD  Clinical pharmacist have reviewed the patient's past medical history, labs, and current medications as part of pre-operative protocol coverage.   The following recommendations have been made:  Patient with diagnosis of A Fib on Xarelto for anticoagulation.     Procedure: 08/25/21 Date of procedure: colonoscopy     CHA2DS2-VASc Score = 4  This indicates a 4.8% annual risk of stroke. The patient's score is based upon: CHF History: 0 HTN History: 1 Diabetes History: 1 Stroke History: 0 Vascular Disease History: 0 Age Score: 1 Gender Score: 1     CrCl 75 mL/min Platelet count 289K     Per office protocol, patient can hold Xarelto for 2 days prior to procedure.    I will route this recommendation to the requesting party via Epic fax function and remove from pre-op pool.  Please call with questions.  Lenna Sciara, NP 08/22/2021, 2:57 PM

## 2021-08-22 NOTE — Telephone Encounter (Signed)
Patient with diagnosis of A Fib on Xarelto for anticoagulation.    Procedure: 08/25/21 Date of procedure: colonoscopy   CHA2DS2-VASc Score = 4  This indicates a 4.8% annual risk of stroke. The patient's score is based upon: CHF History: 0 HTN History: 1 Diabetes History: 1 Stroke History: 0 Vascular Disease History: 0 Age Score: 1 Gender Score: 1    CrCl 75 mL/min Platelet count 289K   Per office protocol, patient can hold Xarelto for 2 days prior to procedure.

## 2021-08-22 NOTE — Telephone Encounter (Signed)
Spoke with patient and told her that per the cardiologist she could hold her Xarelto two days prior to her procedure.  Patient agreed.

## 2021-08-25 ENCOUNTER — Ambulatory Visit (AMBULATORY_SURGERY_CENTER): Payer: Medicare PPO | Admitting: Gastroenterology

## 2021-08-25 ENCOUNTER — Encounter: Payer: Self-pay | Admitting: Gastroenterology

## 2021-08-25 VITALS — BP 133/65 | HR 58 | Temp 97.5°F | Resp 10 | Ht 64.0 in | Wt 187.0 lb

## 2021-08-25 DIAGNOSIS — K2970 Gastritis, unspecified, without bleeding: Secondary | ICD-10-CM

## 2021-08-25 DIAGNOSIS — K317 Polyp of stomach and duodenum: Secondary | ICD-10-CM

## 2021-08-25 DIAGNOSIS — K2900 Acute gastritis without bleeding: Secondary | ICD-10-CM

## 2021-08-25 DIAGNOSIS — K2950 Unspecified chronic gastritis without bleeding: Secondary | ICD-10-CM | POA: Diagnosis not present

## 2021-08-25 DIAGNOSIS — Z86010 Personal history of colonic polyps: Secondary | ICD-10-CM | POA: Diagnosis not present

## 2021-08-25 DIAGNOSIS — D123 Benign neoplasm of transverse colon: Secondary | ICD-10-CM

## 2021-08-25 DIAGNOSIS — R1013 Epigastric pain: Secondary | ICD-10-CM | POA: Diagnosis not present

## 2021-08-25 DIAGNOSIS — D122 Benign neoplasm of ascending colon: Secondary | ICD-10-CM | POA: Diagnosis not present

## 2021-08-25 DIAGNOSIS — Z09 Encounter for follow-up examination after completed treatment for conditions other than malignant neoplasm: Secondary | ICD-10-CM

## 2021-08-25 DIAGNOSIS — D124 Benign neoplasm of descending colon: Secondary | ICD-10-CM

## 2021-08-25 DIAGNOSIS — K219 Gastro-esophageal reflux disease without esophagitis: Secondary | ICD-10-CM | POA: Diagnosis not present

## 2021-08-25 LAB — HM COLONOSCOPY

## 2021-08-25 MED ORDER — SUCRALFATE 1 G PO TABS
1.0000 g | ORAL_TABLET | Freq: Two times a day (BID) | ORAL | 0 refills | Status: DC
Start: 2021-08-25 — End: 2022-05-13

## 2021-08-25 MED ORDER — SODIUM CHLORIDE 0.9 % IV SOLN: 500.0000 mL | Freq: Once | INTRAVENOUS | Status: DC

## 2021-08-25 NOTE — Progress Notes (Signed)
Vitals-DT

## 2021-08-25 NOTE — Progress Notes (Signed)
Sedate, gd SR, tolerated procedure well, VSS, report to RN 

## 2021-08-25 NOTE — Progress Notes (Signed)
Called to room to assist during endoscopic procedure.  Patient ID and intended procedure confirmed with present staff. Received instructions for my participation in the procedure from the performing physician.  

## 2021-08-25 NOTE — Op Note (Signed)
[place] Patient Name: Diana Reeves Procedure Date: 08/25/2021 10:10 AM MRN: [id number] Endoscopist: Mauri Pole , MD Age: 71 Referring MD:  Date of Birth: 1951/02/19 Gender: Female Account #: [id number] Procedure:                Colonoscopy Indications:              High risk colon cancer surveillance: Personal                            history of colonic polyps, High risk colon cancer                            surveillance: Personal history of adenoma (10 mm or                            greater in size), High risk colon cancer                            surveillance: Personal history of multiple (3 or                            more) adenomas Medicines:                Monitored Anesthesia Care Procedure:                Pre-Anesthesia Assessment:                           - Prior to the procedure, a History and Physical                            was performed, and patient medications and                            allergies were reviewed. The patient's tolerance of                            previous anesthesia was also reviewed. The risks                            and benefits of the procedure and the sedation                            options and risks were discussed with the patient.                            All questions were answered, and informed consent                            was obtained. Prior Anticoagulants: The patient                            last took Xarelto (rivaroxaban) 2 days prior to the  procedure. ASA Grade Assessment: III - A patient                            with severe systemic disease. After reviewing the                            risks and benefits, the patient was deemed in                            satisfactory condition to undergo the procedure.                           After obtaining informed consent, the colonoscope                            was passed under direct vision. Throughout the                             procedure, the patient's blood pressure, pulse, and                            oxygen saturations were monitored continuously. The                            Olympus PCF-H190DL (#[id number]) Colonoscope was                            introduced through the anus and advanced to the the                            cecum, identified by appendiceal orifice and                            ileocecal valve. The colonoscopy was performed                            without difficulty. The patient tolerated the                            procedure well. The quality of the bowel                            preparation was excellent. The ileocecal valve,                            appendiceal orifice, and rectum were photographed. Scope In: 10:25:38 AM Scope Out: 10:41:24 AM Scope Withdrawal Time: 0 hours 13 minutes 24 seconds  Total Procedure Duration: 0 hours 15 minutes 46 seconds  Findings:                 The perianal and digital rectal examinations were                            normal.  Ten sessile polyps were found in the descending                            colon, transverse colon and ascending colon. The                            polyps were 4 to 11 mm in size. These polyps were                            removed with a cold snare. Resection and retrieval                            were complete.                           Scattered small and large-mouthed diverticula were                            found in the sigmoid colon, descending colon,                            transverse colon, ascending colon and cecum. There                            was evidence of an impacted diverticulum.                           Non-bleeding external and internal hemorrhoids were                            found during retroflexion. The hemorrhoids were                            medium-sized. Complications:            No immediate complications. Estimated  Blood Loss:     Estimated blood loss was minimal. Impression:               - Ten 4 to 11 mm polyps in the descending colon, in                            the transverse colon and in the ascending colon,                            removed with a cold snare. Resected and retrieved.                           - Moderate diverticulosis in the sigmoid colon, in                            the descending colon, in the transverse colon, in                            the ascending colon and in the cecum. There was  evidence of an impacted diverticulum.                           - Non-bleeding external and internal hemorrhoids. Recommendation:           - Patient has a contact number available for                            emergencies. The signs and symptoms of potential                            delayed complications were discussed with the                            patient. Return to normal activities tomorrow.                            Written discharge instructions were provided to the                            patient.                           - Resume previous diet.                           - Continue present medications.                           - Resume Xarelto (rivaroxaban) at prior dose                            tomorrow. Refer to managing physician for further                            adjustment of therapy.                           - Await pathology results.                           - Repeat colonoscopy in 1 year for surveillance of                            multiple polyps. Mauri Pole, MD 08/25/2021 10:51:04 AM This report has been signed electronically.

## 2021-08-25 NOTE — Op Note (Signed)
[place] Patient Name: Diana Reeves Procedure Date: 08/25/2021 10:10 AM MRN: [id number] Endoscopist: Mauri Pole , MD Age: 71 Referring MD:  Date of Birth: [birth year] Gender: Female Account #: [id number] Procedure:                Upper GI endoscopy Indications:              Epigastric abdominal pain, Esophageal reflux                            symptoms that persist despite appropriate therapy Medicines:                Monitored Anesthesia Care Procedure:                Pre-Anesthesia Assessment:                           - Prior to the procedure, a History and Physical                            was performed, and patient medications and                            allergies were reviewed. The patient's tolerance of                            previous anesthesia was also reviewed. The risks                            and benefits of the procedure and the sedation                            options and risks were discussed with the patient.                            All questions were answered, and informed consent                            was obtained. Prior Anticoagulants: The patient                            last took Xarelto (rivaroxaban) 2 days prior to the                            procedure. ASA Grade Assessment: III - A patient                            with severe systemic disease. After reviewing the                            risks and benefits, the patient was deemed in                            satisfactory condition to undergo the procedure.  After obtaining informed consent, the endoscope was                            passed under direct vision. Throughout the                            procedure, the patient's blood pressure, pulse, and                            oxygen saturations were monitored continuously. The                            Endoscope was introduced through the mouth, and                             advanced to the second part of duodenum. The upper                            GI endoscopy was accomplished without difficulty.                            The patient tolerated the procedure well. Scope In: Scope Out: Findings:                 The Z-line was regular and was found 36 cm from the                            incisors.                           No gross lesions were noted in the entire esophagus.                           A 1 cm hiatal hernia was present.                           Patchy mild inflammation characterized by                            congestion (edema) and erythema was found in the                            entire examined stomach. Biopsies were taken with a                            cold forceps for Helicobacter pylori testing.                           Multiple 5 to 15 mm pedunculated and sessile polyps                            with bleeding and stigmata of recent bleeding were  found in the entire examined stomach. Biopsies were                            taken with a cold forceps for histology.                           The examined duodenum was normal. Complications:            No immediate complications. Estimated Blood Loss:     Estimated blood loss was minimal. Impression:               - Z-line regular, 36 cm from the incisors.                           - No gross lesions in esophagus.                           - 1 cm hiatal hernia.                           - Gastritis. Biopsied.                           - Multiple gastric polyps. Biopsied.                           - Normal examined duodenum. Recommendation:           - Resume previous diet.                           - Continue present medications.                           - Await pathology results.                           - Use sucralfate tablets 1 gram PO BID for 2 weeks.                           - Return to GI clinic at the next available                             appointment in 4-6 weeks                           - Schedule HIDA scan next available appointment and                            will consider referral to surgery for possible                            cholecystectomy Mauri Pole, MD 08/25/2021 10:54:40 AM This report has been signed electronically.

## 2021-08-25 NOTE — Patient Instructions (Addendum)
RESUME XARELO (RIVAROXABAN) TOMORROW.   REFER TO MANAGING PHYSICIAN FOR FURTHER ADJUSTMENT OF THERAPY.  USE SUCRALFATE TABLETS 1 GRAM BY MOUTH TWICE DAILY FOR 2 WEEKS.   RETURN TO GI CLINIC AT THE NEXT AVAILABLE APPOINTMENT AND WILL CONSIDER REFERRAL TO SURGERY FOR POSSIBLE CHOLECYSTECTOMY.  (FRIDAY, AUGUST 4TH AT 10:45AM)  HANDOUTS ON DIVERTICULOSIS, HEMORRHOIDS AND POLYPS GIVEN.   REPEAT COLONOSCOPY IN 1 YEAR FOR SURVEILLANCE OF MULTIPLE POLYPS.    YOU HAD AN ENDOSCOPIC PROCEDURE TODAY AT THE [facility]:   Refer to the procedure report that was given to you for any specific questions about what was found during the examination.  If the procedure report does not answer your questions, please call your gastroenterologist to clarify.  If you requested that your care partner not be given the details of your procedure findings, then the procedure report has been included in a sealed envelope for you to review at your convenience later.  YOU SHOULD EXPECT: Some feelings of bloating in the abdomen. Passage of more gas than usual.  Walking can help get rid of the air that was put into your GI tract during the procedure and reduce the bloating. If you had a lower endoscopy (such as a colonoscopy or flexible sigmoidoscopy) you may notice spotting of blood in your stool or on the toilet paper. If you underwent a bowel prep for your procedure, you may not have a normal bowel movement for a few days.  Please Note:  You might notice some irritation and congestion in your nose or some drainage.  This is from the oxygen used during your procedure.  There is no need for concern and it should clear up in a day or so.  SYMPTOMS TO REPORT IMMEDIATELY:  Following lower endoscopy (colonoscopy or flexible sigmoidoscopy):  Excessive amounts of blood in the stool  Significant tenderness or worsening of abdominal pains  Swelling of the abdomen that is new, acute  Fever of 100F or higher  Following  upper endoscopy (EGD)  Vomiting of blood or coffee ground material  New chest pain or pain under the shoulder blades  Painful or persistently difficult swallowing  New shortness of breath  Fever of 100F or higher  Black, tarry-looking stools  For urgent or emergent issues, a gastroenterologist can be reached at any hour by calling [phone]. Do not use MyChart messaging for urgent concerns.    DIET:  We do recommend a small meal at first, but then you may proceed to your regular diet.  Drink plenty of fluids but you should avoid alcoholic beverages for 24 hours.  ACTIVITY:  You should plan to take it easy for the rest of today and you should NOT DRIVE or use heavy machinery until tomorrow (because of the sedation medicines used during the test).    FOLLOW UP: Our staff will call the number listed on your records 24-72 hours following your procedure to check on you and address any questions or concerns that you may have regarding the information given to you following your procedure. If we do not reach you, we will leave a message.  We will attempt to reach you two times.  During this call, we will ask if you have developed any symptoms of COVID 19. If you develop any symptoms (ie: fever, flu-like symptoms, shortness of breath, cough etc.) before then, please call [phone].  If you test positive for Covid 19 in the 2 weeks post procedure, please call and report this information to  [place].    If any biopsies were taken you will be contacted by phone or by letter within the next 1-3 weeks.  Please call us at [phone] if you have not heard about the biopsies in 3 weeks.    SIGNATURES/CONFIDENTIALITY: You and/or your care partner have signed paperwork which will be entered into your electronic medical record.  These signatures attest to the fact that that the information above on your After Visit Summary has been reviewed and is understood.  Full responsibility of the confidentiality  of this discharge information lies with you and/or your care-partner.

## 2021-08-25 NOTE — Progress Notes (Signed)
[facility] History and Physical   Primary Care Physician:  Janith Lima, MD   Reason for Procedure:  History of adenomatous colon polyps, epigastric abd pain, gerd  Plan:    Surveillance colonoscopy with possible interventions as needed and diagnostic EGD     HPI: Diana Reeves is a very pleasant 71 y.o. female here for surveillance colonoscopy and EGD for evaluation of nausea, persistent GERD symptoms despite therapy.  The risks and benefits as well as alternatives of endoscopic procedure(s) have been discussed and reviewed. All questions answered. The patient agrees to proceed.    Past Medical History:  Diagnosis Date   A-fib [facility])    Allergic state 12/26/2016   Basal cell carcinoma    Benign neoplasm of colon    Degenerative cervical disc    Depression    Diverticulosis of colon (without mention of hemorrhage)    Esophageal reflux    Glaucoma    Osteoporosis, unspecified 12/2018   2007 T score -3.0, 2012 T score -1.9, 2015 T score -2.1 overall stable, 2018 T score -2.0, 2020 T score -1.8   Other chronic nonalcoholic liver disease    Sinusitis 12/26/2016   Stricture and stenosis of esophagus     Past Surgical History:  Procedure Laterality Date   ABDOMINAL HYSTERECTOMY  1993   TAH-LSO-Post. repair-Burch   ATRIAL FIBRILLATION ABLATION N/A 04/21/2021   Procedure: ATRIAL FIBRILLATION ABLATION;  Surgeon: Vickie Epley, MD;  Location: [facility];  Service: Cardiovascular;  Laterality: N/A;   CARDIOVERSION N/A 11/28/2020   Procedure: CARDIOVERSION;  Surgeon: Jerline Pain, MD;  Location: [facility];  Service: Cardiovascular;  Laterality: N/A;   CARDIOVERSION N/A 01/27/2021   Procedure: CARDIOVERSION;  Surgeon: Fay Records, MD;  Location: [facility] END[facility]COPY;  Service: Cardiovascular;  Laterality: N/A;   COLONOSCOPY     EYE SURGERY  10/18/2012   lt eye   knee arthroscop Right 2018   left arm surgery  7-09   POLYPECTOMY      Prior to Admission  medications   Medication Sig Start Date End Date Taking? Authorizing Provider  Sod Picosulfate-Mag Ox-Cit Acd (CLENPIQ) 10-3.5-12 MG-GM -GM/160ML SOLN Take as a split dose prep using doctor's instructions Patient not taking: Reported on 07/23/2021 06/30/21   Mauri Pole, MD  Azelastine-Fluticasone 137-50 MCG/ACT SUSP Place 1 spray into both nostrils daily as needed (allergies).    [provider]  Blood Glucose Calibration (OT ULTRA/FASTTK CNTRL SOLN) SOLN 1 Act by In Vitro route 2 (two) times daily. 12/04/20   Janith Lima, MD  Blood Glucose Monitoring Suppl (ONE TOUCH ULTRA 2) w/Device KIT 1 Act by Does not apply route 2 (two) times daily. 12/04/20   Janith Lima, MD  clonazePAM (KLONOPIN) 0.5 MG tablet Take 1 tablet (0.5 mg total) by mouth 2 (two) times daily as needed for anxiety. 06/30/21 06/30/22  Plotnikov, Evie Lacks, MD  diltiazem (CARDIZEM CD) 180 MG 24 hr capsule Take 1 capsule (180 mg total) by mouth daily. 07/23/21   Vickie Epley, MD  dorzolamide-timolol (COSOPT) 22.3-6.8 MG/ML ophthalmic solution Place 1 drop into the right eye 2 (two) times daily.    [provider]  DULoxetine (CYMBALTA) 60 MG capsule TAKE ONE CAPSULE BY MOUTH DAILY 04/07/21   Janith Lima, MD  EPINEPHrine 0.3 mg/0.3 mL IJ SOAJ injection Inject 0.3 mg into the muscle as needed for anaphylaxis. 08/24/12   [provider]  esomeprazole (NEXIUM) 40 MG capsule TAKE 1  CAPSULE (40 MG TOTAL) BY MOUTH DAILY BEFORE BREAKFAST. 02/27/21   Janith Lima, MD  glucose blood [facility] ULTRA) test strip Use as instructed 12/04/20   Janith Lima, MD  NONFORMULARY OR COMPOUNDED ITEM Estradiol vaginal cream 0.02% insert vaginally twice weeky 12/30/20   Princess Bruins, MD  rivaroxaban (XARELTO) 20 MG TABS tablet Take 20 mg by mouth daily with supper.    [provider]    Current Outpatient Medications  Medication Sig Dispense Refill   Sod Picosulfate-Mag Ox-Cit Acd (CLENPIQ)  10-3.5-12 MG-GM -GM/160ML SOLN Take as a split dose prep using doctor's instructions (Patient not taking: Reported on 07/23/2021) 320 mL 0   Azelastine-Fluticasone 137-50 MCG/ACT SUSP Place 1 spray into both nostrils daily as needed (allergies).     Blood Glucose Calibration (OT ULTRA/FASTTK CNTRL SOLN) SOLN 1 Act by In Vitro route 2 (two) times daily. 1 each 2   Blood Glucose Monitoring Suppl (ONE TOUCH ULTRA 2) w/Device KIT 1 Act by Does not apply route 2 (two) times daily. 1 kit 1   clonazePAM (KLONOPIN) 0.5 MG tablet Take 1 tablet (0.5 mg total) by mouth 2 (two) times daily as needed for anxiety. 60 tablet 0   diltiazem (CARDIZEM CD) 180 MG 24 hr capsule Take 1 capsule (180 mg total) by mouth daily. 90 capsule 3   dorzolamide-timolol (COSOPT) 22.3-6.8 MG/ML ophthalmic solution Place 1 drop into the right eye 2 (two) times daily.     DULoxetine (CYMBALTA) 60 MG capsule TAKE ONE CAPSULE BY MOUTH DAILY 90 capsule 1   EPINEPHrine 0.3 mg/0.3 mL IJ SOAJ injection Inject 0.3 mg into the muscle as needed for anaphylaxis.     esomeprazole (NEXIUM) 40 MG capsule TAKE 1 CAPSULE (40 MG TOTAL) BY MOUTH DAILY BEFORE BREAKFAST. 90 capsule 1   glucose blood (ONETOUCH ULTRA) test strip Use as instructed 100 each 5   NONFORMULARY OR COMPOUNDED ITEM Estradiol vaginal cream 0.02% insert vaginally twice weeky 90 each 4   rivaroxaban (XARELTO) 20 MG TABS tablet Take 20 mg by mouth daily with supper.     No current facility-administered medications for this visit.    Allergies as of 08/25/2021 - Review Complete 07/23/2021  Allergen Reaction Noted   Dust mite extract  03/04/2011   Mold extract [trichophyton mentagrophyte]  03/04/2011    Family History  Problem Relation Age of Onset   Heart disease Mother    Cancer Father        colon   Hypertension Father    Colon cancer Father 64   Cancer Maternal Grandmother        uterine   Diabetes Paternal Grandfather    Cancer Son        Testicular cancer   Heart  attack Son    Stomach cancer Neg Hx    Rectal cancer Neg Hx     Social History   Socioeconomic History   Marital status: Divorced    Spouse name: Not on file   Number of children: 3   Years of education: Not on file   Highest education level: Not on file  Occupational History   Occupation: retired  Tobacco Use   Smoking status: Former   Smokeless tobacco: Never   Tobacco comments:    In college  Vaping Use   Vaping Use: Never used  Substance and Sexual Activity   Alcohol use: Not Currently   Drug use: No   Sexual activity: Not Currently    Birth control/protection: Surgical  Comment: 1st intercourse 71 yo-More than 5 partner,, hysterectomy  Other Topics Concern   Not on file  Social History Narrative   Not on file   Social Determinants of Health   Financial Resource Strain: Low Risk  (06/12/2020)   Overall Financial Resource Strain (CARDIA)    Difficulty of Paying Living Expenses: Not hard at all  Food Insecurity: No Food Insecurity (06/12/2020)   Hunger Vital Sign    Worried About Running Out of Food in the Last Year: Never true    Ran Out of Food in the Last Year: Never true  Transportation Needs: No Transportation Needs (06/12/2020)   PRAPARE - Hydrologist (Medical): No    Lack of Transportation (Non-Medical): No  Physical Activity: Inactive (06/12/2020)   Exercise Vital Sign    Days of Exercise per Week: 0 days    Minutes of Exercise per Session: 0 min  Stress: No Stress Concern Present (06/12/2020)   [place]    Feeling of Stress : Not at all  Social Connections: Unknown (06/12/2020)   Social Connection and Isolation Panel [NHANES]    Frequency of Communication with Friends and Family: More than three times a week    Frequency of Social Gatherings with Friends and Family: More than three times a week    Attends Religious Services: More than 4 times per year     Active Member of [employer] or Organizations: No    Attends Music therapist: More than 4 times per year    Marital Status: Not on file  Intimate Partner Violence: Not on file    Review of Systems:  All other review of systems negative except as mentioned in the HPI.  Physical Exam: Vital signs in last 24 hours: LMP 03/17/1991  General:   Alert, NAD Lungs:  Clear .   Heart:  Regular rate and rhythm Abdomen:  Soft, nontender and nondistended. Neuro/Psych:  Alert and cooperative. Normal mood and affect. A and O x 3  Reviewed labs, radiology imaging, old records and pertinent past GI work up  Patient is appropriate for planned procedure(s) and anesthesia in an ambulatory setting   K. Denzil Magnuson , MD [phone]

## 2021-08-26 ENCOUNTER — Telehealth: Payer: Self-pay

## 2021-08-26 ENCOUNTER — Telehealth: Payer: Self-pay | Admitting: *Deleted

## 2021-08-26 NOTE — Telephone Encounter (Signed)
Left message on follow up call. 

## 2021-08-26 NOTE — Telephone Encounter (Signed)
  Follow up Call-     08/25/2021    9:55 AM  Call back number  Permission to leave phone message Yes     Patient questions: Left message to call us if necessary.

## 2021-08-27 ENCOUNTER — Other Ambulatory Visit: Payer: Self-pay

## 2021-08-27 DIAGNOSIS — K828 Other specified diseases of gallbladder: Secondary | ICD-10-CM

## 2021-08-27 DIAGNOSIS — R1084 Generalized abdominal pain: Secondary | ICD-10-CM

## 2021-08-28 DIAGNOSIS — J3089 Other allergic rhinitis: Secondary | ICD-10-CM | POA: Diagnosis not present

## 2021-08-28 DIAGNOSIS — J301 Allergic rhinitis due to pollen: Secondary | ICD-10-CM | POA: Diagnosis not present

## 2021-08-28 DIAGNOSIS — J3081 Allergic rhinitis due to animal (cat) (dog) hair and dander: Secondary | ICD-10-CM | POA: Diagnosis not present

## 2021-09-02 ENCOUNTER — Other Ambulatory Visit: Payer: Self-pay | Admitting: Internal Medicine

## 2021-09-02 DIAGNOSIS — K2100 Gastro-esophageal reflux disease with esophagitis, without bleeding: Secondary | ICD-10-CM

## 2021-09-04 ENCOUNTER — Encounter: Payer: Self-pay | Admitting: Gastroenterology

## 2021-09-05 ENCOUNTER — Ambulatory Visit: Payer: Medicare PPO

## 2021-09-10 DIAGNOSIS — J3089 Other allergic rhinitis: Secondary | ICD-10-CM | POA: Diagnosis not present

## 2021-09-22 DIAGNOSIS — J3081 Allergic rhinitis due to animal (cat) (dog) hair and dander: Secondary | ICD-10-CM | POA: Diagnosis not present

## 2021-09-22 DIAGNOSIS — J301 Allergic rhinitis due to pollen: Secondary | ICD-10-CM | POA: Diagnosis not present

## 2021-09-22 DIAGNOSIS — J3089 Other allergic rhinitis: Secondary | ICD-10-CM | POA: Diagnosis not present

## 2021-09-26 ENCOUNTER — Ambulatory Visit (INDEPENDENT_AMBULATORY_CARE_PROVIDER_SITE_OTHER): Payer: Medicare PPO

## 2021-09-26 DIAGNOSIS — Z0000 Encounter for general adult medical examination without abnormal findings: Secondary | ICD-10-CM | POA: Diagnosis not present

## 2021-09-26 NOTE — Patient Instructions (Signed)
[name] , Thank you for taking time to come for your Medicare Wellness Visit. I appreciate your ongoing commitment to your health goals. Please review the following plan we discussed and let me know if I can assist you in the future.   Screening recommendations/referrals: Colonoscopy: 08/25/2021 Mammogram: 11/22/2020 Bone Density: 03/04/2021 Recommended yearly ophthalmology/optometry visit for glaucoma screening and checkup Recommended yearly dental visit for hygiene and checkup  Vaccinations: Influenza vaccine: completed  Pneumococcal vaccine: completed  Tdap vaccine: due Shingles vaccine: will consider     Advanced directives: yes   Conditions/risks identified: none   Next appointment: none    Preventive Care 73 Years and Older, Female Preventive care refers to lifestyle choices and visits with your health care provider that can promote health and wellness. What does preventive care include? A yearly physical exam. This is also called an annual well check. Dental exams once or twice a year. Routine eye exams. Ask your health care provider how often you should have your eyes checked. Personal lifestyle choices, including: Daily care of your teeth and gums. Regular physical activity. Eating a healthy diet. Avoiding tobacco and drug use. Limiting alcohol use. Practicing safe sex. Taking low-dose aspirin every day. Taking vitamin and mineral supplements as recommended by your health care provider. What happens during an annual well check? The services and screenings done by your health care provider during your annual well check will depend on your age, overall health, lifestyle risk factors, and family history of disease. Counseling  Your health care provider may ask you questions about your: Alcohol use. Tobacco use. Drug use. Emotional well-being. Home and relationship well-being. Sexual activity. Eating habits. History of falls. Memory and ability to understand  (cognition). Work and work Statistician. Reproductive health. Screening  You may have the following tests or measurements: Height, weight, and BMI. Blood pressure. Lipid and cholesterol levels. These may be checked every 5 years, or more frequently if you are over 23 years old. Skin check. Lung cancer screening. You may have this screening every year starting at age 73 if you have a 30-pack-year history of smoking and currently smoke or have quit within the past 15 years. Fecal occult blood test (FOBT) of the stool. You may have this test every year starting at age 65. Flexible sigmoidoscopy or colonoscopy. You may have a sigmoidoscopy every 5 years or a colonoscopy every 10 years starting at age 73. Hepatitis C blood test. Hepatitis B blood test. Sexually transmitted disease (STD) testing. Diabetes screening. This is done by checking your blood sugar (glucose) after you have not eaten for a while (fasting). You may have this done every 1-3 years. Bone density scan. This is done to screen for osteoporosis. You may have this done starting at age 73. Mammogram. This may be done every 1-2 years. Talk to your health care provider about how often you should have regular mammograms. Talk with your health care provider about your test results, treatment options, and if necessary, the need for more tests. Vaccines  Your health care provider may recommend certain vaccines, such as: Influenza vaccine. This is recommended every year. Tetanus, diphtheria, and acellular pertussis (Tdap, Td) vaccine. You may need a Td booster every 10 years. Zoster vaccine. You may need this after age 73. Pneumococcal 13-valent conjugate (PCV13) vaccine. One dose is recommended after age 73. Pneumococcal polysaccharide (PPSV23) vaccine. One dose is recommended after age 73. Talk to your health care provider about which screenings and vaccines you need and how often  you need them. This information is not intended to  replace advice given to you by your health care provider. Make sure you discuss any questions you have with your health care provider. Document Released: 03/29/2015 Document Revised: 11/20/2015 Document Reviewed: 01/01/2015 Elsevier Interactive Patient Education  2017 [place] Prevention in the Home Falls can cause injuries. They can happen to people of all ages. There are many things you can do to make your home safe and to help prevent falls. What can I do on the outside of my home? Regularly fix the edges of walkways and driveways and fix any cracks. Remove anything that might make you trip as you walk through a door, such as a raised step or threshold. Trim any bushes or trees on the path to your home. Use bright outdoor lighting. Clear any walking paths of anything that might make someone trip, such as rocks or tools. Regularly check to see if handrails are loose or broken. Make sure that both sides of any steps have handrails. Any raised decks and porches should have guardrails on the edges. Have any leaves, snow, or ice cleared regularly. Use sand or salt on walking paths during winter. Clean up any spills in your garage right away. This includes oil or grease spills. What can I do in the bathroom? Use night lights. Install grab bars by the toilet and in the tub and shower. Do not use towel bars as grab bars. Use non-skid mats or decals in the tub or shower. If you need to sit down in the shower, use a plastic, non-slip stool. Keep the floor dry. Clean up any water that spills on the floor as soon as it happens. Remove soap buildup in the tub or shower regularly. Attach bath mats securely with double-sided non-slip rug tape. Do not have throw rugs and other things on the floor that can make you trip. What can I do in the bedroom? Use night lights. Make sure that you have a light by your bed that is easy to reach. Do not use any sheets or blankets that are too big for  your bed. They should not hang down onto the floor. Have a firm chair that has side arms. You can use this for support while you get dressed. Do not have throw rugs and other things on the floor that can make you trip. What can I do in the kitchen? Clean up any spills right away. Avoid walking on wet floors. Keep items that you use a lot in easy-to-reach places. If you need to reach something above you, use a strong step stool that has a grab bar. Keep electrical cords out of the way. Do not use floor polish or wax that makes floors slippery. If you must use wax, use non-skid floor wax. Do not have throw rugs and other things on the floor that can make you trip. What can I do with my stairs? Do not leave any items on the stairs. Make sure that there are handrails on both sides of the stairs and use them. Fix handrails that are broken or loose. Make sure that handrails are as long as the stairways. Check any carpeting to make sure that it is firmly attached to the stairs. Fix any carpet that is loose or worn. Avoid having throw rugs at the top or bottom of the stairs. If you do have throw rugs, attach them to the floor with carpet tape. Make sure that you have a light  switch at the top of the stairs and the bottom of the stairs. If you do not have them, ask someone to add them for you. What else can I do to help prevent falls? Wear shoes that: Do not have high heels. Have rubber bottoms. Are comfortable and fit you well. Are closed at the toe. Do not wear sandals. If you use a stepladder: Make sure that it is fully opened. Do not climb a closed stepladder. Make sure that both sides of the stepladder are locked into place. Ask someone to hold it for you, if possible. Clearly mark and make sure that you can see: Any grab bars or handrails. First and last steps. Where the edge of each step is. Use tools that help you move around (mobility aids) if they are needed. These  include: Canes. Walkers. Scooters. Crutches. Turn on the lights when you go into a dark area. Replace any light bulbs as soon as they burn out. Set up your furniture so you have a clear path. Avoid moving your furniture around. If any of your floors are uneven, fix them. If there are any pets around you, be aware of where they are. Review your medicines with your doctor. Some medicines can make you feel dizzy. This can increase your chance of falling. Ask your doctor what other things that you can do to help prevent falls. This information is not intended to replace advice given to you by your health care provider. Make sure you discuss any questions you have with your health care provider. Document Released: 12/27/2008 Document Revised: 08/08/2015 Document Reviewed: 04/06/2014 Elsevier Interactive Patient Education  2017 [employer].

## 2021-09-26 NOTE — Progress Notes (Signed)
Subjective:   Diana Reeves is a 71 y.o. female who presents for Medicare Annual (Subsequent) preventive examination.   I connected with Arnika Larzelere  today by telephone and verified that I am speaking with the correct person using two identifiers. Location patient: home Location provider: work Persons participating in the virtual visit: patient, provider.   I discussed the limitations, risks, security and privacy concerns of performing an evaluation and management service by telephone and the availability of in person appointments. I also discussed with the patient that there may be a patient responsible charge related to this service. The patient expressed understanding and verbally consented to this telephonic visit.    Interactive audio and video telecommunications were attempted between this provider and patient, however failed, due to patient having technical difficulties OR patient did not have access to video capability.  We continued and completed visit with audio only.    Review of Systems     Cardiac Risk Factors include: advanced age (>35mn, >>46women)     Objective:    Today's Vitals   There is no height or weight on file to calculate BMI.     09/26/2021    1:13 PM 04/21/2021    9:27 AM 01/27/2021    8:52 AM 11/28/2020    7:50 AM 06/12/2020    1:52 PM 01/30/2019    8:57 AM 01/26/2018    8:20 AM  Advanced Directives  Does Patient Have a Medical Advance Directive? Yes Yes Yes Yes Yes Yes Yes  Type of AParamedicof ABolingbrokeLiving will HSt. PierreLiving will  HHeraldLiving will Living will;Healthcare Power of AHowellLiving will HUraniaLiving will  Does patient want to make changes to medical advance directive?  No - Patient declined   No - Patient declined    Copy of HClaytonin Chart? No - copy requested No - copy requested  Yes -  validated most recent copy scanned in chart (See row information) No - copy requested No - copy requested No - copy requested    Current Medications (verified) Outpatient Encounter Medications as of 09/26/2021  Medication Sig   Azelastine-Fluticasone 137-50 MCG/ACT SUSP Place 1 spray into both nostrils daily as needed (allergies).   Blood Glucose Calibration (OT ULTRA/FASTTK CNTRL SOLN) SOLN 1 Act by In Vitro route 2 (two) times daily.   Blood Glucose Monitoring Suppl (ONE TOUCH ULTRA 2) w/Device KIT 1 Act by Does not apply route 2 (two) times daily.   clonazePAM (KLONOPIN) 0.5 MG tablet Take 1 tablet (0.5 mg total) by mouth 2 (two) times daily as needed for anxiety.   diltiazem (CARDIZEM CD) 180 MG 24 hr capsule Take 1 capsule (180 mg total) by mouth daily.   dorzolamide-timolol (COSOPT) 22.3-6.8 MG/ML ophthalmic solution Place 1 drop into the right eye 2 (two) times daily.   DULoxetine (CYMBALTA) 60 MG capsule TAKE ONE CAPSULE BY MOUTH DAILY   EPINEPHrine 0.3 mg/0.3 mL IJ SOAJ injection Inject 0.3 mg into the muscle as needed for anaphylaxis.   esomeprazole (NEXIUM) 40 MG capsule TAKE ONE CAPSULE BY MOUTH EVERY MORNING BEFORE BREAKFAST   glucose blood (ONETOUCH ULTRA) test strip Use as instructed   NONFORMULARY OR COMPOUNDED ITEM Estradiol vaginal cream 0.02% insert vaginally twice weeky   rivaroxaban (XARELTO) 20 MG TABS tablet Take 20 mg by mouth daily with supper.   rivaroxaban (XARELTO) 20 MG TABS tablet Take 20 mg by  mouth daily with supper.   sucralfate (CARAFATE) 1 g tablet Take 1 tablet (1 g total) by mouth 2 (two) times daily for 14 days.   No facility-administered encounter medications on file as of 09/26/2021.    Allergies (verified) Dust mite extract and Mold extract [trichophyton mentagrophyte]   History: Past Medical History:  Diagnosis Date   A-fib ([place])    Allergic state 12/26/2016   Basal cell carcinoma    Benign neoplasm of colon    Degenerative cervical disc     Depression    Diverticulosis of colon (without mention of hemorrhage)    Esophageal reflux    Glaucoma    Osteoporosis, unspecified 12/2018   2007 T score -3.0, 2012 T score -1.9, 2015 T score -2.1 overall stable, 2018 T score -2.0, 2020 T score -1.8   Other chronic nonalcoholic liver disease    Sinusitis 12/26/2016   Stricture and stenosis of esophagus    Past Surgical History:  Procedure Laterality Date   ABDOMINAL HYSTERECTOMY  1993   TAH-LSO-Post. repair-Burch   ATRIAL FIBRILLATION ABLATION N/A 04/21/2021   Procedure: ATRIAL FIBRILLATION ABLATION;  Surgeon: Vickie Epley, MD;  Location: [facility];  Service: Cardiovascular;  Laterality: N/A;   CARDIOVERSION N/A 11/28/2020   Procedure: CARDIOVERSION;  Surgeon: Jerline Pain, MD;  Location: [facility] ENDOSCOPY;  Service: Cardiovascular;  Laterality: N/A;   CARDIOVERSION N/A 01/27/2021   Procedure: CARDIOVERSION;  Surgeon: Fay Records, MD;  Location: [facility] ENDOSCOPY;  Service: Cardiovascular;  Laterality: N/A;   COLONOSCOPY     EYE SURGERY  10/18/2012   lt eye   knee arthroscop Right 2018   left arm surgery  7-09   POLYPECTOMY     Family History  Problem Relation Age of Onset   Heart disease Mother    Cancer Father        colon   Hypertension Father    Colon cancer Father 67   Cancer Maternal Grandmother        uterine   Diabetes Paternal Grandfather    Cancer Son        Testicular cancer   Heart attack Son    Stomach cancer Neg Hx    Rectal cancer Neg Hx    Social History   Socioeconomic History   Marital status: Divorced    Spouse name: Not on file   Number of children: 3   Years of education: Not on file   Highest education level: Not on file  Occupational History   Occupation: retired  Tobacco Use   Smoking status: Former   Smokeless tobacco: Never   Tobacco comments:    In college  Vaping Use   Vaping Use: Never used  Substance and Sexual Activity   Alcohol use: Not Currently   Drug use: No    Sexual activity: Not Currently    Birth control/protection: Surgical    Comment: 1st intercourse 71 yo-More than 5 partner,, hysterectomy  Other Topics Concern   Not on file  Social History Narrative   Not on file   Social Determinants of Health   Financial Resource Strain: Low Risk  (09/26/2021)   Overall Financial Resource Strain (CARDIA)    Difficulty of Paying Living Expenses: Not hard at all  Food Insecurity: No Food Insecurity (09/26/2021)   Hunger Vital Sign    Worried About Running Out of Food in the Last Year: Never true    Ran Out of Food in the Last Year: Never true  Transportation Needs: No Transportation Needs (09/26/2021)   PRAPARE - Hydrologist (Medical): No    Lack of Transportation (Non-Medical): No  Physical Activity: Sufficiently Active (09/26/2021)   Exercise Vital Sign    Days of Exercise per Week: 4 days    Minutes of Exercise per Session: 60 min  Stress: No Stress Concern Present (09/26/2021)   [place]    Feeling of Stress : Not at all  Social Connections: Moderately Isolated (09/26/2021)   Social Connection and Isolation Panel [NHANES]    Frequency of Communication with Friends and Family: Three times a week    Frequency of Social Gatherings with Friends and Family: Three times a week    Attends Religious Services: More than 4 times per year    Active Member of Clubs or Organizations: No    Attends Archivist Meetings: Never    Marital Status: Divorced    Tobacco Counseling Counseling given: Not Answered Tobacco comments: In college   Clinical Intake:  Pre-visit preparation completed: Yes  Pain : No/denies pain     Nutritional Risks: None Diabetes: No  How often do you need to have someone help you when you read instructions, pamphlets, or other written materials from your doctor or pharmacy?: 1 - Never What is the last grade level you  completed in school?: college  Diabetic?no   Interpreter Needed?: No  Information entered by :: [place] of Daily Living    09/26/2021    1:17 PM  In your present state of health, do you have any difficulty performing the following activities:  Hearing? 0  Vision? 0  Difficulty concentrating or making decisions? 0  Walking or climbing stairs? 0  Dressing or bathing? 0  Doing errands, shopping? 0  Preparing Food and eating ? N  Using the Toilet? N  In the past six months, have you accidently leaked urine? N  Do you have problems with loss of bowel control? N  Managing your Medications? N  Managing your Finances? N  Housekeeping or managing your Housekeeping? N    Patient Care Team: Janith Lima, MD as PCP - General (Internal Medicine) Sanda Klein, MD as PCP - Cardiology (Cardiology) Vickie Epley, MD as PCP - Electrophysiology (Cardiology) Luberta Mutter, MD as Consulting Physician (Ophthalmology)  Indicate any recent Medical Services you may have received from other than [facility] providers in the past year (date may be approximate).     Assessment:   This is a routine wellness examination for [place].  Hearing/Vision screen Vision Screening - Comments:: Annual eye exams   Dietary issues and exercise activities discussed: Current Exercise Habits: Home exercise routine, Type of exercise: walking, Time (Minutes): 60, Frequency (Times/Week): 3, Weekly Exercise (Minutes/Week): 180, Intensity: Mild, Exercise limited by: None identified   Goals Addressed   None    Depression Screen    09/26/2021    1:13 PM 09/26/2021    1:11 PM 02/20/2021    9:44 AM 10/28/2020   10:48 AM 06/12/2020    1:50 PM 01/31/2020    8:20 AM 01/30/2019   12:09 PM  PHQ 2/9 Scores  PHQ - 2 Score 0 0 0 0 0 0 0  PHQ- 9 Score   0 0  0     Fall Risk    09/26/2021    1:13 PM 06/12/2020    1:52 PM 01/31/2020    8:20 AM 01/30/2019  12:09 PM 01/28/2018   11:26 AM  Fall  Risk   Falls in the past year? 0 1 0 0 0  Number falls in past yr: 0 0  0   Injury with Fall? 0 1  0   Follow up Falls evaluation completed;Education provided Falls evaluation completed       FALL RISK PREVENTION PERTAINING TO THE HOME:  Any stairs in or around the home? Yes  If so, are there any without handrails? No  Home free of loose throw rugs in walkways, pet beds, electrical cords, etc? Yes  Adequate lighting in your home to reduce risk of falls? Yes   ASSISTIVE DEVICES UTILIZED TO PREVENT FALLS:  Life alert? No  Use of a cane, walker or w/c? No  Grab bars in the bathroom? Yes  Shower chair or bench in shower? No  Elevated toilet seat or a handicapped toilet? No   Cognitive Function:    Normal cognitive status assessed by telephone conversation by this Nurse Health Advisor. No abnormalities found.      Immunizations Immunization History  Administered Date(s) Administered   Influenza, High Dose Seasonal PF 01/06/2017, 12/09/2017   Influenza,inj,Quad PF,6+ Mos 11/05/2015, 12/09/2018   Influenza-Unspecified 12/15/2014, 12/15/2019, 12/14/2020   PFIZER(Purple Top)SARS-COV-2 Vaccination 04/23/2019, 05/18/2019, 08/21/2019, 12/15/2019   Pneumococcal Conjugate-13 08/29/2014   Pneumococcal Polysaccharide-23 01/01/2011, 01/26/2018   Tdap 10/08/2010   Zoster, Live 06/20/2012    TDAP status: Due, Education has been provided regarding the importance of this vaccine. Advised may receive this vaccine at local pharmacy or Health Dept. Aware to provide a copy of the vaccination record if obtained from local pharmacy or Health Dept. Verbalized acceptance and understanding.  Flu Vaccine status: Up to date  Pneumococcal vaccine status: Up to date  Covid-19 vaccine status: Completed vaccines  Qualifies for Shingles Vaccine? Yes   Zostavax completed No   Shingrix Completed?: No.    Education has been provided regarding the importance of this vaccine. Patient has been advised to  call insurance company to determine out of pocket expense if they have not yet received this vaccine. Advised may also receive vaccine at local pharmacy or Health Dept. Verbalized acceptance and understanding.  Screening Tests Health Maintenance  Topic Date Due   Zoster Vaccines- Shingrix (1 of 2) Never done   COVID-19 Vaccine (5 - Pfizer series) 02/09/2020   TETANUS/TDAP  10/07/2020   HEMOGLOBIN A1C  08/21/2021   INFLUENZA VACCINE  10/14/2021   MAMMOGRAM  11/22/2021   Diabetic kidney evaluation - Urine ACR  02/20/2022   FOOT EXAM  02/20/2022   Diabetic kidney evaluation - GFR measurement  06/20/2022   OPHTHALMOLOGY EXAM  07/16/2022   COLONOSCOPY (Pts 45-10yr Insurance coverage will need to be confirmed)  08/26/2022   Pneumonia Vaccine 71 Years old  Completed   DEXA SCAN  Completed   Hepatitis C Screening  Completed   HPV VACCINES  Aged Out    Health Maintenance  Health Maintenance Due  Topic Date Due   Zoster Vaccines- Shingrix (1 of 2) Never done   COVID-19 Vaccine (5 - Pfizer series) 02/09/2020   TETANUS/TDAP  10/07/2020   HEMOGLOBIN A1C  08/21/2021    Colorectal cancer screening: Type of screening: Colonoscopy. Completed 08/25/2021. Repeat every 1 years  Mammogram status: Completed 11/22/2020. Repeat every year  Bone Density status: Completed 03/04/2021. Results reflect: Bone density results: OSTEOPENIA. Repeat every 5 years.  Lung Cancer Screening: (Low Dose CT Chest recommended if Age 73-80years, 30 pack-year currently  smoking OR have quit w/in 15years.) does not qualify.   Lung Cancer Screening Referral: n/a  Additional Screening:  Hepatitis C Screening: does not qualify; Completed 01/30/2019  Vision Screening: Recommended annual ophthalmology exams for early detection of glaucoma and other disorders of the eye. Is the patient up to date with their annual eye exam?  Yes  Who is the provider or what is the name of the office in which the patient attends annual  eye exams? [facility]  If pt is not established with a provider, would they like to be referred to a provider to establish care? No .   Dental Screening: Recommended annual dental exams for proper oral hygiene  Community Resource Referral / Chronic Care Management: CRR required this visit?  No   CCM required this visit?  No      Plan:     I have personally reviewed and noted the following in the patient's chart:   Medical and social history Use of alcohol, tobacco or illicit drugs  Current medications and supplements including opioid prescriptions.  Functional ability and status Nutritional status Physical activity Advanced directives List of other physicians Hospitalizations, surgeries, and ER visits in previous 12 months Vitals Screenings to include cognitive, depression, and falls Referrals and appointments  In addition, I have reviewed and discussed with patient certain preventive protocols, quality metrics, and best practice recommendations. A written personalized care plan for preventive services as well as general preventive health recommendations were provided to patient.     Randel Pigg, LPN   [id number]   Nurse Notes: none

## 2021-10-06 ENCOUNTER — Other Ambulatory Visit: Payer: Self-pay | Admitting: Internal Medicine

## 2021-10-06 DIAGNOSIS — F418 Other specified anxiety disorders: Secondary | ICD-10-CM

## 2021-10-10 ENCOUNTER — Ambulatory Visit (HOSPITAL_COMMUNITY)
Admission: RE | Admit: 2021-10-10 | Discharge: 2021-10-10 | Disposition: A | Discharge status: Home / Self Care | Payer: Medicare PPO | Source: Ambulatory Visit | Attending: Gastroenterology | Admitting: Gastroenterology

## 2021-10-10 DIAGNOSIS — K828 Other specified diseases of gallbladder: Secondary | ICD-10-CM | POA: Diagnosis not present

## 2021-10-10 DIAGNOSIS — R109 Unspecified abdominal pain: Secondary | ICD-10-CM | POA: Diagnosis not present

## 2021-10-10 DIAGNOSIS — R1084 Generalized abdominal pain: Secondary | ICD-10-CM | POA: Diagnosis not present

## 2021-10-10 MED ORDER — TECHNETIUM TC 99M MEBROFENIN IV KIT
5.0000 | PACK | Freq: Once | INTRAVENOUS | Status: AC | PRN
Start: 2021-10-10 — End: 2021-10-10
  Administered 2021-10-10: 5 via INTRAVENOUS

## 2021-10-14 DIAGNOSIS — H401423 Capsular glaucoma with pseudoexfoliation of lens, left eye, severe stage: Secondary | ICD-10-CM | POA: Diagnosis not present

## 2021-10-14 DIAGNOSIS — H40011 Open angle with borderline findings, low risk, right eye: Secondary | ICD-10-CM | POA: Diagnosis not present

## 2021-10-17 ENCOUNTER — Ambulatory Visit: Payer: Medicare PPO | Admitting: Gastroenterology

## 2021-10-17 ENCOUNTER — Encounter: Payer: Self-pay | Admitting: Gastroenterology

## 2021-10-17 VITALS — BP 118/66 | HR 70 | Ht 65.0 in | Wt 186.0 lb

## 2021-10-17 DIAGNOSIS — K824 Cholesterolosis of gallbladder: Secondary | ICD-10-CM | POA: Diagnosis not present

## 2021-10-17 DIAGNOSIS — K8020 Calculus of gallbladder without cholecystitis without obstruction: Secondary | ICD-10-CM

## 2021-10-17 DIAGNOSIS — Z86010 Personal history of colonic polyps: Secondary | ICD-10-CM

## 2021-10-17 DIAGNOSIS — R1013 Epigastric pain: Secondary | ICD-10-CM

## 2021-10-17 NOTE — Progress Notes (Signed)
Diana Reeves    [id number]    [birth year]  Primary Care Physician:Jones, Arvid Right, MD  Referring Physician: Janith Lima, MD [place],  [place] [address]   Chief complaint: Quadrant discomfort  HPI:  71 year old very pleasant female here for follow-up visit for abdominal discomfort  Overall she is feeling much better. Denies any nausea, vomiting, abdominal pain, melena or bright red blood per rectum   HIDA scan October 10, 2021 1.  Low normal gallbladder ejection fraction.  35% 2.  Patent cystic and common bile ducts.  EGD 08/25/2021 - Z-line regular, 36 cm from the incisors. - No gross lesions in esophagus. - 1 cm hiatal hernia. - Gastritis. Biopsied. - Multiple gastric polyps. Biopsied. - Normal examined duodenum.  Colonoscopy 08/25/2021 - Ten 4 to 11 mm polyps in the descending colon, in the transverse colon and in the ascending colon, removed with a cold snare. Resected and retrieved. - Moderate diverticulosis in the sigmoid colon, in the descending colon, in the transverse colon, in the ascending colon and in the cecum. There was evidence of an impacted diverticulum. - Non-bleeding external and internal hemorrhoids.  1. Surgical [P], gastric polyp biopsy - HYPERPLASTIC POLYP WITH EVIDENCE OF ULCERATION (ACUTE INFLAMMATORY CELL ULCER EXUDATE). - NEGATIVE FOR MALIGNANCY. 2. Surgical [P], gastric antrum and gastric body biopsy - REACTIVE GASTROPATHY WITH MILD CHRONIC INFLAMMATION AND MILD FOVEOLAR HYPERPLASIA, INVOLVING ANTRAL-TYPE MUCOSA - GASTRIC OXYNTIC MUCOSA WITH FEATURES OF FUNDIC GLAND POLYP AND MILD FOVEOLAR HYPERPLASIA. - HELICOBACTER PYLORI IMMUNOHISTOCHEMICAL (IHC) STAIN NEGATIVE, WITH ADEQUATE CONTROLS. - NEGATIVE FOR INTESTINAL METAPLASIA AND MALIGNANCY. 3. Surgical [P], colon, ascending polyp x 8, transverse x 1, polyp (9) - TUBULAR ADENOMAS, 9 ADENOMATOUS PORTIONS, NEGATIVE FOR HIGH-GRADE DYSPLASIA. 4. Surgical [P], colon,  descending polyp x 1, polyp (1) - TUBULAR ADENOMA, NEGATIVE FOR HIGH-GRADE DYSPLASIA.  Right upper quadrant abdominal ultrasound July 07, 2021 1. Mild sludge in the gallbladder. No stones, wall thickening, or pericholecystic fluid. 2. The 4 mm non mobile hyperechoic mass associated the gallbladder wall is consistent with a small polyp. Follow-up imaging is not recommended for polyps less than 6 mm. 3. Diffuse increased echogenicity throughout the liver is nonspecific but often seen with hepatic steatosis.   Outpatient Encounter Medications as of 10/17/2021  Medication Sig   Azelastine-Fluticasone 137-50 MCG/ACT SUSP Place 1 spray into both nostrils daily as needed (allergies).   Blood Glucose Calibration (OT ULTRA/FASTTK CNTRL SOLN) SOLN 1 Act by In Vitro route 2 (two) times daily.   Blood Glucose Monitoring Suppl (ONE TOUCH ULTRA 2) w/Device KIT 1 Act by Does not apply route 2 (two) times daily.   clonazePAM (KLONOPIN) 0.5 MG tablet Take 1 tablet (0.5 mg total) by mouth 2 (two) times daily as needed for anxiety.   diltiazem (CARDIZEM CD) 180 MG 24 hr capsule Take 1 capsule (180 mg total) by mouth daily.   dorzolamide-timolol (COSOPT) 22.3-6.8 MG/ML ophthalmic solution Place 1 drop into the right eye 2 (two) times daily.   DULoxetine (CYMBALTA) 60 MG capsule TAKE ONE CAPSULE BY MOUTH DAILY   EPINEPHrine 0.3 mg/0.3 mL IJ SOAJ injection Inject 0.3 mg into the muscle as needed for anaphylaxis.   esomeprazole (NEXIUM) 40 MG capsule TAKE ONE CAPSULE BY MOUTH EVERY MORNING BEFORE BREAKFAST   glucose blood (ONETOUCH ULTRA) test strip Use as instructed   NONFORMULARY OR COMPOUNDED ITEM Estradiol vaginal cream 0.02% insert vaginally twice weeky   rivaroxaban (XARELTO) 20  MG TABS tablet Take 20 mg by mouth daily with supper.   rivaroxaban (XARELTO) 20 MG TABS tablet Take 20 mg by mouth daily with supper.   sucralfate (CARAFATE) 1 g tablet Take 1 tablet (1 g total) by mouth 2 (two) times daily for 14  days.   No facility-administered encounter medications on file as of 10/17/2021.    Allergies as of 10/17/2021 - Review Complete 09/26/2021  Allergen Reaction Noted   Dust mite extract  03/04/2011   Mold extract [trichophyton mentagrophyte]  03/04/2011    Past Medical History:  Diagnosis Date   [facility]    Allergic state 12/26/2016   Basal cell carcinoma    Benign neoplasm of colon    Degenerative cervical disc    Depression    Diverticulosis of colon (without mention of hemorrhage)    Esophageal reflux    Glaucoma    Osteoporosis, unspecified 12/2018   2007 T score -3.0, 2012 T score -1.9, 2015 T score -2.1 overall stable, 2018 T score -2.0, 2020 T score -1.8   Other chronic nonalcoholic liver disease    Sinusitis 12/26/2016   Stricture and stenosis of esophagus     Past Surgical History:  Procedure Laterality Date   ABDOMINAL HYSTERECTOMY  1993   TAH-LSO-Post. repair-Burch   ATRIAL FIBRILLATION ABLATION N/A 04/21/2021   Procedure: ATRIAL FIBRILLATION ABLATION;  Surgeon: Vickie Epley, MD;  Location: [facility];  Service: Cardiovascular;  Laterality: N/A;   CARDIOVERSION N/A 11/28/2020   Procedure: CARDIOVERSION;  Surgeon: Jerline Pain, MD;  Location: [facility] ENDOSCOPY;  Service: Cardiovascular;  Laterality: N/A;   CARDIOVERSION N/A 01/27/2021   Procedure: CARDIOVERSION;  Surgeon: Fay Records, MD;  Location: [facility] ENDOSCOPY;  Service: Cardiovascular;  Laterality: N/A;   COLONOSCOPY     EYE SURGERY  10/18/2012   lt eye   knee arthroscop Right 2018   left arm surgery  7-09   POLYPECTOMY      Family History  Problem Relation Age of Onset   Heart disease Mother    Cancer Father        colon   Hypertension Father    Colon cancer Father 21   Cancer Maternal Grandmother        uterine   Diabetes Paternal Grandfather    Cancer Son        Testicular cancer   Heart attack Son    Stomach cancer Neg Hx    Rectal cancer Neg Hx     Social History    Socioeconomic History   Marital status: Divorced    Spouse name: Not on file   Number of children: 3   Years of education: Not on file   Highest education level: Not on file  Occupational History   Occupation: retired  Tobacco Use   Smoking status: Former   Smokeless tobacco: Never   Tobacco comments:    In college  Vaping Use   Vaping Use: Never used  Substance and Sexual Activity   Alcohol use: Not Currently   Drug use: No   Sexual activity: Not Currently    Birth control/protection: Surgical    Comment: 1st intercourse 70 yo-More than 5 partner,, hysterectomy  Other Topics Concern   Not on file  Social History Narrative   Not on file   Social Determinants of Health   Financial Resource Strain: Low Risk  (09/26/2021)   Overall Financial Resource Strain (CARDIA)    Difficulty of Paying Living Expenses: Not hard  at all  Food Insecurity: No Food Insecurity (09/26/2021)   Hunger Vital Sign    Worried About Running Out of Food in the Last Year: Never true    Ran Out of Food in the Last Year: Never true  Transportation Needs: No Transportation Needs (09/26/2021)   PRAPARE - Hydrologist (Medical): No    Lack of Transportation (Non-Medical): No  Physical Activity: Sufficiently Active (09/26/2021)   Exercise Vital Sign    Days of Exercise per Week: 4 days    Minutes of Exercise per Session: 60 min  Stress: No Stress Concern Present (09/26/2021)   [place]    Feeling of Stress : Not at all  Social Connections: Moderately Isolated (09/26/2021)   Social Connection and Isolation Panel [NHANES]    Frequency of Communication with Friends and Family: Three times a week    Frequency of Social Gatherings with Friends and Family: Three times a week    Attends Religious Services: More than 4 times per year    Active Member of Clubs or Organizations: No    Attends Archivist  Meetings: Never    Marital Status: Divorced  Human resources officer Violence: Not At Risk (09/26/2021)   Humiliation, Afraid, Rape, and Kick questionnaire    Fear of Current or Ex-Partner: No    Emotionally Abused: No    Physically Abused: No    Sexually Abused: No      Review of systems: All other review of systems negative except as mentioned in the HPI.   Physical Exam: Vitals:   10/17/21 1029  BP: 118/66  Pulse: 70  SpO2: 96%   Body mass index is 30.95 kg/m. Gen:      No acute distress HEENT:  sclera anicteric Abd:      soft, non-tender; no palpable masses, no distension Ext:    No edema Neuro: alert and oriented x 3 Psych: normal mood and affect  Data Reviewed:  Reviewed labs, radiology imaging, old records and pertinent past GI work up   Assessment and Plan/Recommendations:  71 year old very pleasant female here for follow-up visit for abdominal discomfort and dyspepsia. Right upper quadrant ultrasound with biliary sludge and 4 mm gallbladder polyp.  HIDA scan with borderline low ejection fraction. Her symptoms have improved in the past few weeks, we will continue to monitor.  Patient wants to hold off referral to surgery to consider cholecystectomy We will plan to repeat right upper quadrant ultrasound in April 2024  History of multiple adenomatous colon polyps >10 due for surveillance colonoscopy in June 2024  Return in 1 year or sooner if needed   This visit required >30 minutes of patient care (this includes precharting, chart review, review of results, face-to-face time used for counseling as well as treatment plan and follow-up. The patient was provided an opportunity to ask questions and all were answered. The patient agreed with the plan and demonstrated an understanding of the instructions.  Damaris Hippo , MD    CC: Janith Lima, MD

## 2021-10-17 NOTE — Patient Instructions (Signed)
You will be due for an ultrasound 06/2022.  You will be due for a follow up office visit 07/2022.  You will be due for another colonoscopy 08/2022

## 2021-10-22 DIAGNOSIS — L814 Other melanin hyperpigmentation: Secondary | ICD-10-CM | POA: Diagnosis not present

## 2021-10-22 DIAGNOSIS — D1801 Hemangioma of skin and subcutaneous tissue: Secondary | ICD-10-CM | POA: Diagnosis not present

## 2021-10-22 DIAGNOSIS — Z85828 Personal history of other malignant neoplasm of skin: Secondary | ICD-10-CM | POA: Diagnosis not present

## 2021-10-22 DIAGNOSIS — L578 Other skin changes due to chronic exposure to nonionizing radiation: Secondary | ICD-10-CM | POA: Diagnosis not present

## 2021-10-22 DIAGNOSIS — L821 Other seborrheic keratosis: Secondary | ICD-10-CM | POA: Diagnosis not present

## 2021-10-25 ENCOUNTER — Other Ambulatory Visit: Payer: Self-pay | Admitting: Cardiology

## 2021-10-25 DIAGNOSIS — I4891 Unspecified atrial fibrillation: Secondary | ICD-10-CM

## 2021-10-27 ENCOUNTER — Ambulatory Visit: Payer: Self-pay | Admitting: Licensed Clinical Social Worker

## 2021-10-27 NOTE — Telephone Encounter (Signed)
Prescription refill request for Xarelto received.  Indication:Afib Last office visit:5/23 Weight:84.4 kg Age:71 Scr:0.8 CrCl:83.45 ml/min  Prescription refilled

## 2021-10-27 NOTE — Patient Outreach (Signed)
  Care Coordination   10/27/2021 Name: Diana Reeves MRN: [id number] DOB: 02/23/1951   Care Coordination Outreach Attempts:  An unsuccessful telephone outreach was attempted today to offer the patient information about available care coordination services as a benefit of their health plan.   Follow Up Plan:  Additional outreach attempts will be made to offer the patient care coordination information and services.   Encounter Outcome:  No Answer  Care Coordination Interventions Activated:  No   Care Coordination Interventions:  No, not indicated    Casimer Lanius, [place] [phone]

## 2021-11-03 ENCOUNTER — Other Ambulatory Visit: Payer: Self-pay

## 2021-11-03 NOTE — Patient Outreach (Signed)
  Care Coordination   11/03/2021 Name: Diana Reeves MRN: [id number] DOB: 02-24-1951   Care Coordination Outreach Attempts:  A second unsuccessful outreach was attempted today to offer the patient with information about available care coordination services as a benefit of their health plan.     Follow Up Plan:  Additional outreach attempts will be made to offer the patient care coordination information and services.   Encounter Outcome:  No Answer  Care Coordination Interventions Activated:  No   Care Coordination Interventions:  No, not indicated    Thea Silversmith, RN, MSN, BSN, CCM Care Coordinator [phone]

## 2021-11-06 ENCOUNTER — Ambulatory Visit: Payer: Self-pay

## 2021-11-06 NOTE — Patient Outreach (Signed)
  Care Coordination   11/06/2021 Name: Diana Reeves MRN: [id number] DOB: 27-Jul-1950   Care Coordination Outreach Attempts:  A third unsuccessful outreach was attempted today to offer the patient with information about available care coordination services as a benefit of their health plan.   Follow Up Plan:  No further outreach attempts will be made at this time. We have been unable to contact the patient to offer or enroll patient in care coordination services  Encounter Outcome:  No Answer  Care Coordination Interventions Activated:  No   Care Coordination Interventions:  No, not indicated    Thea Silversmith, RN, MSN, BSN, CCM Care Coordinator [phone]

## 2021-11-07 ENCOUNTER — Ambulatory Visit: Payer: Self-pay

## 2021-11-07 NOTE — Patient Outreach (Signed)
  Care Coordination   11/07/2021 Name: Diana Reeves MRN: [id number] DOB: Oct 13, 1950   Care Coordination Outreach Attempts:  An unsuccessful telephone outreach was attempted today to offer the patient information about available care coordination services as a benefit of their health plan. RNCM returned call to patient-unsuccessful. 4 unsuccessful outreach attempts.  Follow Up Plan:  No further outreach attempts will be made at this time. We have been unable to contact the patient to offer or enroll patient in care coordination services  Encounter Outcome:  No Answer  Care Coordination Interventions Activated:  No   Care Coordination Interventions:  No, not indicated    Thea Silversmith, RN, MSN, BSN, CCM Care Coordinator [phone]

## 2021-11-10 ENCOUNTER — Ambulatory Visit: Payer: Self-pay

## 2021-11-10 NOTE — Patient Outreach (Signed)
  Care Coordination   Initial Visit Note   11/10/2021 Name: NALA KACHEL MRN: [id number] DOB: 1950/07/27  SARIE STALL is a 71 y.o. year old female who sees Janith Lima, MD for primary care. I spoke with  Hedy Camara by phone today.  What matters to the patients health and wellness today?  Patient denies any care coordination/care management needs or resource needs at this time.    Goals Addressed             This Visit's Progress    COMPLETED: Care Coordination Activities-no follow up required       Care Coordination Interventions: Discussed vaccinations due(tetanus, covid, shingrix) and hemoglobin A1C due . Advised patient to contact primary care provider and/or RN Care coordinator if care coordination/care management or resource needs in the future         SDOH assessments and interventions completed:  Yes  SDOH Interventions Today    Flowsheet Row Most Recent Value  SDOH Interventions   Food Insecurity Interventions Intervention Not Indicated  Transportation Interventions Intervention Not Indicated        Care Coordination Interventions Activated:  Yes  Care Coordination Interventions:  Yes, provided   Follow up plan: No further intervention required.   Encounter Outcome:  Pt. Visit Completed   Thea Silversmith, RN, MSN, BSN, CCM Care Coordinator [phone]

## 2021-11-10 NOTE — Progress Notes (Unsigned)
This encounter was created in error - please disregard.

## 2021-11-24 DIAGNOSIS — Z1231 Encounter for screening mammogram for malignant neoplasm of breast: Secondary | ICD-10-CM | POA: Diagnosis not present

## 2021-11-24 LAB — HM MAMMOGRAPHY

## 2021-12-02 DIAGNOSIS — R921 Mammographic calcification found on diagnostic imaging of breast: Secondary | ICD-10-CM | POA: Diagnosis not present

## 2021-12-03 DIAGNOSIS — J3089 Other allergic rhinitis: Secondary | ICD-10-CM | POA: Diagnosis not present

## 2021-12-03 DIAGNOSIS — J301 Allergic rhinitis due to pollen: Secondary | ICD-10-CM | POA: Diagnosis not present

## 2021-12-03 DIAGNOSIS — J3081 Allergic rhinitis due to animal (cat) (dog) hair and dander: Secondary | ICD-10-CM | POA: Diagnosis not present

## 2021-12-05 DIAGNOSIS — J3089 Other allergic rhinitis: Secondary | ICD-10-CM | POA: Diagnosis not present

## 2021-12-09 DIAGNOSIS — J3089 Other allergic rhinitis: Secondary | ICD-10-CM | POA: Diagnosis not present

## 2021-12-09 DIAGNOSIS — J301 Allergic rhinitis due to pollen: Secondary | ICD-10-CM | POA: Diagnosis not present

## 2021-12-09 DIAGNOSIS — J3081 Allergic rhinitis due to animal (cat) (dog) hair and dander: Secondary | ICD-10-CM | POA: Diagnosis not present

## 2021-12-12 DIAGNOSIS — J301 Allergic rhinitis due to pollen: Secondary | ICD-10-CM | POA: Diagnosis not present

## 2021-12-12 DIAGNOSIS — J3089 Other allergic rhinitis: Secondary | ICD-10-CM | POA: Diagnosis not present

## 2021-12-12 DIAGNOSIS — J3081 Allergic rhinitis due to animal (cat) (dog) hair and dander: Secondary | ICD-10-CM | POA: Diagnosis not present

## 2021-12-16 DIAGNOSIS — J301 Allergic rhinitis due to pollen: Secondary | ICD-10-CM | POA: Diagnosis not present

## 2021-12-16 DIAGNOSIS — J3081 Allergic rhinitis due to animal (cat) (dog) hair and dander: Secondary | ICD-10-CM | POA: Diagnosis not present

## 2021-12-16 DIAGNOSIS — J3089 Other allergic rhinitis: Secondary | ICD-10-CM | POA: Diagnosis not present

## 2021-12-19 DIAGNOSIS — J3089 Other allergic rhinitis: Secondary | ICD-10-CM | POA: Diagnosis not present

## 2021-12-22 DIAGNOSIS — H401423 Capsular glaucoma with pseudoexfoliation of lens, left eye, severe stage: Secondary | ICD-10-CM | POA: Diagnosis not present

## 2021-12-22 DIAGNOSIS — H40011 Open angle with borderline findings, low risk, right eye: Secondary | ICD-10-CM | POA: Diagnosis not present

## 2021-12-30 DIAGNOSIS — J301 Allergic rhinitis due to pollen: Secondary | ICD-10-CM | POA: Diagnosis not present

## 2021-12-30 DIAGNOSIS — J3081 Allergic rhinitis due to animal (cat) (dog) hair and dander: Secondary | ICD-10-CM | POA: Diagnosis not present

## 2021-12-30 DIAGNOSIS — J3089 Other allergic rhinitis: Secondary | ICD-10-CM | POA: Diagnosis not present

## 2022-01-05 DIAGNOSIS — J3081 Allergic rhinitis due to animal (cat) (dog) hair and dander: Secondary | ICD-10-CM | POA: Diagnosis not present

## 2022-01-05 DIAGNOSIS — J3089 Other allergic rhinitis: Secondary | ICD-10-CM | POA: Diagnosis not present

## 2022-01-05 DIAGNOSIS — J301 Allergic rhinitis due to pollen: Secondary | ICD-10-CM | POA: Diagnosis not present

## 2022-01-14 DIAGNOSIS — J301 Allergic rhinitis due to pollen: Secondary | ICD-10-CM | POA: Diagnosis not present

## 2022-01-14 DIAGNOSIS — J3089 Other allergic rhinitis: Secondary | ICD-10-CM | POA: Diagnosis not present

## 2022-01-14 DIAGNOSIS — J3081 Allergic rhinitis due to animal (cat) (dog) hair and dander: Secondary | ICD-10-CM | POA: Diagnosis not present

## 2022-01-20 DIAGNOSIS — J3081 Allergic rhinitis due to animal (cat) (dog) hair and dander: Secondary | ICD-10-CM | POA: Diagnosis not present

## 2022-01-20 DIAGNOSIS — J301 Allergic rhinitis due to pollen: Secondary | ICD-10-CM | POA: Diagnosis not present

## 2022-01-20 DIAGNOSIS — J3089 Other allergic rhinitis: Secondary | ICD-10-CM | POA: Diagnosis not present

## 2022-01-28 DIAGNOSIS — J301 Allergic rhinitis due to pollen: Secondary | ICD-10-CM | POA: Diagnosis not present

## 2022-01-28 DIAGNOSIS — J3081 Allergic rhinitis due to animal (cat) (dog) hair and dander: Secondary | ICD-10-CM | POA: Diagnosis not present

## 2022-01-28 DIAGNOSIS — J3089 Other allergic rhinitis: Secondary | ICD-10-CM | POA: Diagnosis not present

## 2022-02-02 DIAGNOSIS — H40011 Open angle with borderline findings, low risk, right eye: Secondary | ICD-10-CM | POA: Diagnosis not present

## 2022-02-02 DIAGNOSIS — H401423 Capsular glaucoma with pseudoexfoliation of lens, left eye, severe stage: Secondary | ICD-10-CM | POA: Diagnosis not present

## 2022-02-10 DIAGNOSIS — J3081 Allergic rhinitis due to animal (cat) (dog) hair and dander: Secondary | ICD-10-CM | POA: Diagnosis not present

## 2022-02-10 DIAGNOSIS — J3089 Other allergic rhinitis: Secondary | ICD-10-CM | POA: Diagnosis not present

## 2022-02-10 DIAGNOSIS — J301 Allergic rhinitis due to pollen: Secondary | ICD-10-CM | POA: Diagnosis not present

## 2022-02-19 DIAGNOSIS — J3081 Allergic rhinitis due to animal (cat) (dog) hair and dander: Secondary | ICD-10-CM | POA: Diagnosis not present

## 2022-02-19 DIAGNOSIS — J301 Allergic rhinitis due to pollen: Secondary | ICD-10-CM | POA: Diagnosis not present

## 2022-02-19 DIAGNOSIS — J3089 Other allergic rhinitis: Secondary | ICD-10-CM | POA: Diagnosis not present

## 2022-02-28 ENCOUNTER — Other Ambulatory Visit: Payer: Self-pay | Admitting: Internal Medicine

## 2022-02-28 DIAGNOSIS — K2100 Gastro-esophageal reflux disease with esophagitis, without bleeding: Secondary | ICD-10-CM

## 2022-03-06 ENCOUNTER — Other Ambulatory Visit: Payer: Self-pay | Admitting: Internal Medicine

## 2022-03-06 DIAGNOSIS — K2100 Gastro-esophageal reflux disease with esophagitis, without bleeding: Secondary | ICD-10-CM

## 2022-03-14 ENCOUNTER — Other Ambulatory Visit: Payer: Self-pay | Admitting: Internal Medicine

## 2022-03-14 DIAGNOSIS — K2100 Gastro-esophageal reflux disease with esophagitis, without bleeding: Secondary | ICD-10-CM

## 2022-04-02 ENCOUNTER — Other Ambulatory Visit: Payer: Self-pay | Admitting: Internal Medicine

## 2022-04-02 DIAGNOSIS — F418 Other specified anxiety disorders: Secondary | ICD-10-CM

## 2022-04-22 ENCOUNTER — Other Ambulatory Visit: Payer: Self-pay | Admitting: Cardiology

## 2022-04-22 DIAGNOSIS — I4891 Unspecified atrial fibrillation: Secondary | ICD-10-CM

## 2022-04-22 NOTE — Telephone Encounter (Signed)
Prescription refill request for Xarelto received.  Indication: AF Last office visit:  07/23/21   Grayce Sessions MD Weight: 84.8kg Age: 72 Scr: 0.81 on 06/19/21  Epic CrCl: 82.81   Based on above findings Xarelto '20mg'$  daily is the appropriate dose.  Refill approved.

## 2022-05-12 ENCOUNTER — Encounter: Payer: Self-pay | Admitting: Gastroenterology

## 2022-05-13 ENCOUNTER — Ambulatory Visit (INDEPENDENT_AMBULATORY_CARE_PROVIDER_SITE_OTHER): Payer: Medicare PPO | Admitting: Internal Medicine

## 2022-05-13 ENCOUNTER — Encounter: Payer: Self-pay | Admitting: Internal Medicine

## 2022-05-13 VITALS — BP 134/84 | HR 83 | Temp 98.3°F | Resp 16 | Ht 65.0 in | Wt 190.8 lb

## 2022-05-13 DIAGNOSIS — K2100 Gastro-esophageal reflux disease with esophagitis, without bleeding: Secondary | ICD-10-CM | POA: Diagnosis not present

## 2022-05-13 DIAGNOSIS — M818 Other osteoporosis without current pathological fracture: Secondary | ICD-10-CM

## 2022-05-13 DIAGNOSIS — E118 Type 2 diabetes mellitus with unspecified complications: Secondary | ICD-10-CM

## 2022-05-13 DIAGNOSIS — Z0001 Encounter for general adult medical examination with abnormal findings: Secondary | ICD-10-CM

## 2022-05-13 DIAGNOSIS — I10 Essential (primary) hypertension: Secondary | ICD-10-CM

## 2022-05-13 DIAGNOSIS — E785 Hyperlipidemia, unspecified: Secondary | ICD-10-CM

## 2022-05-13 DIAGNOSIS — I4819 Other persistent atrial fibrillation: Secondary | ICD-10-CM | POA: Diagnosis not present

## 2022-05-13 DIAGNOSIS — I480 Paroxysmal atrial fibrillation: Secondary | ICD-10-CM

## 2022-05-13 DIAGNOSIS — K7581 Nonalcoholic steatohepatitis (NASH): Secondary | ICD-10-CM

## 2022-05-13 DIAGNOSIS — K5904 Chronic idiopathic constipation: Secondary | ICD-10-CM | POA: Diagnosis not present

## 2022-05-13 DIAGNOSIS — J4521 Mild intermittent asthma with (acute) exacerbation: Secondary | ICD-10-CM | POA: Insufficient documentation

## 2022-05-13 DIAGNOSIS — D6869 Other thrombophilia: Secondary | ICD-10-CM

## 2022-05-13 DIAGNOSIS — J45901 Unspecified asthma with (acute) exacerbation: Secondary | ICD-10-CM

## 2022-05-13 DIAGNOSIS — N182 Chronic kidney disease, stage 2 (mild): Secondary | ICD-10-CM

## 2022-05-13 LAB — CBC WITH DIFFERENTIAL/PLATELET
Basophils Absolute: 0 10*3/uL (ref 0.0–0.1)
Basophils Relative: 0.6 % (ref 0.0–3.0)
Eosinophils Absolute: 0.1 10*3/uL (ref 0.0–0.7)
Eosinophils Relative: 2.3 % (ref 0.0–5.0)
HCT: 39.7 % (ref 36.0–46.0)
Hemoglobin: 13.2 g/dL (ref 12.0–15.0)
Lymphocytes Relative: 39 % (ref 12.0–46.0)
Lymphs Abs: 2 10*3/uL (ref 0.7–4.0)
MCHC: 33.1 g/dL (ref 30.0–36.0)
MCV: 80.6 fl (ref 78.0–100.0)
Monocytes Absolute: 0.5 10*3/uL (ref 0.1–1.0)
Monocytes Relative: 9.3 % (ref 3.0–12.0)
Neutro Abs: 2.5 10*3/uL (ref 1.4–7.7)
Neutrophils Relative %: 48.8 % (ref 43.0–77.0)
Platelets: 210 10*3/uL (ref 150.0–400.0)
RBC: 4.93 Mil/uL (ref 3.87–5.11)
RDW: 15.1 % (ref 11.5–15.5)
WBC: 5.1 10*3/uL (ref 4.0–10.5)

## 2022-05-13 LAB — MICROALBUMIN / CREATININE URINE RATIO
Creatinine,U: 257.7 mg/dL
Microalb Creat Ratio: 1.1 mg/g (ref 0.0–30.0)
Microalb, Ur: 2.8 mg/dL — ABNORMAL HIGH (ref 0.0–1.9)

## 2022-05-13 LAB — LIPID PANEL
Cholesterol: 145 mg/dL (ref 0–200)
HDL: 44.9 mg/dL (ref >39.00)
LDL Cholesterol: 86 mg/dL (ref 0–99)
NonHDL: 100.27
Total CHOL/HDL Ratio: 3
Triglycerides: 71 mg/dL (ref 0.0–149.0)
VLDL: 14.2 mg/dL (ref 0.0–40.0)

## 2022-05-13 LAB — BASIC METABOLIC PANEL
BUN: 13 mg/dL (ref 6–23)
CO2: 28 mEq/L (ref 19–32)
Calcium: 9.2 mg/dL (ref 8.4–10.5)
Chloride: 101 mEq/L (ref 96–112)
Creatinine, Ser: 0.75 mg/dL (ref 0.40–1.20)
GFR: 78.7 mL/min (ref >60.00)
Glucose, Bld: 142 mg/dL — ABNORMAL HIGH (ref 70–99)
Potassium: 3.6 mEq/L (ref 3.5–5.1)
Sodium: 137 mEq/L (ref 135–145)

## 2022-05-13 LAB — URINALYSIS, ROUTINE W REFLEX MICROSCOPIC
Hgb urine dipstick: NEGATIVE
Ketones, ur: NEGATIVE
Nitrite: NEGATIVE
RBC / HPF: NONE SEEN (ref 0–?)
Specific Gravity, Urine: >=1.03 — AB (ref 1.000–1.030)
Total Protein, Urine: NEGATIVE
Urine Glucose: NEGATIVE
Urobilinogen, UA: 1 (ref 0.0–1.0)
pH: 6 (ref 5.0–8.0)

## 2022-05-13 LAB — HEPATIC FUNCTION PANEL
ALT: 21 U/L (ref 0–35)
AST: 33 U/L (ref 0–37)
Albumin: 3.9 g/dL (ref 3.5–5.2)
Alkaline Phosphatase: 76 U/L (ref 39–117)
Bilirubin, Direct: 0.1 mg/dL (ref 0.0–0.3)
Total Bilirubin: 0.4 mg/dL (ref 0.2–1.2)
Total Protein: 7.1 g/dL (ref 6.0–8.3)

## 2022-05-13 LAB — TSH: TSH: 1.76 u[IU]/mL (ref 0.35–5.50)

## 2022-05-13 LAB — VITAMIN D 25 HYDROXY (VIT D DEFICIENCY, FRACTURES): VITD: 29.01 ng/mL — ABNORMAL LOW (ref 30.00–100.00)

## 2022-05-13 LAB — HEMOGLOBIN A1C: Hgb A1c MFr Bld: 7.6 % — ABNORMAL HIGH (ref 4.6–6.5)

## 2022-05-13 MED ORDER — BOOSTRIX 5-2.5-18.5 LF-MCG/0.5 IM SUSP: 0.5000 mL | Freq: Once | INTRAMUSCULAR | 0 refills | Status: AC

## 2022-05-13 MED ORDER — METHYLPREDNISOLONE ACETATE 80 MG/ML IJ SUSP
80.0000 mg | Freq: Once | INTRAMUSCULAR | Status: AC
  Administered 2022-05-13: 80 mg via INTRAMUSCULAR

## 2022-05-13 MED ORDER — EMPAGLIFLOZIN 10 MG PO TABS: 10.0000 mg | ORAL_TABLET | Freq: Every day | ORAL | 0 refills | Status: DC

## 2022-05-13 MED ORDER — CHOLECALCIFEROL 50 MCG (2000 UT) PO TABS: 1.0000 | ORAL_TABLET | Freq: Every day | ORAL | 3 refills | Status: DC

## 2022-05-13 MED ORDER — AIRSUPRA 90-80 MCG/ACT IN AERO: 2.0000 | INHALATION_SPRAY | Freq: Four times a day (QID) | RESPIRATORY_TRACT | 5 refills | Status: DC | PRN

## 2022-05-13 MED ORDER — SHINGRIX 50 MCG/0.5ML IM SUSR: 0.5000 mL | Freq: Once | INTRAMUSCULAR | 1 refills | Status: DC

## 2022-05-13 NOTE — Progress Notes (Signed)
Subjective:  Patient ID: Diana Reeves, female    DOB: [birth year]  Age: 72 y.o. MRN: [id number]  CC: Annual Exam, Atrial Fibrillation, Cough, Asthma, Hypertension, Hyperlipidemia, and Diabetes   HPI Diana Reeves presents for a CPX and f/up ----  She complains of a 5-day history of nonproductive cough, wheezing, and shortness of breath.  She denies chest pain, diaphoresis, fever, chills, night sweats, or hemoptysis.   Outpatient Medications Prior to Visit  Medication Sig Dispense Refill   Azelastine-Fluticasone 137-50 MCG/ACT SUSP Place 1 spray into both nostrils daily as needed (allergies).     Blood Glucose Calibration (OT ULTRA/FASTTK CNTRL SOLN) SOLN 1 Act by In Vitro route 2 (two) times daily. 1 each 2   Blood Glucose Monitoring Suppl (ONE TOUCH ULTRA 2) w/Device KIT 1 Act by Does not apply route 2 (two) times daily. 1 kit 1   clonazePAM (KLONOPIN) 0.5 MG tablet Take 1 tablet (0.5 mg total) by mouth 2 (two) times daily as needed for anxiety. 60 tablet 0   diltiazem (CARDIZEM CD) 180 MG 24 hr capsule Take 1 capsule (180 mg total) by mouth daily. 90 capsule 3   dorzolamide-timolol (COSOPT) 22.3-6.8 MG/ML ophthalmic solution Place 1 drop into the right eye 2 (two) times daily.     DULoxetine (CYMBALTA) 60 MG capsule TAKE 1 CAPSULE BY MOUTH DAILY 90 capsule 0   EPINEPHrine 0.3 mg/0.3 mL IJ SOAJ injection Inject 0.3 mg into the muscle as needed for anaphylaxis.     esomeprazole (NEXIUM) 40 MG capsule TAKE ONE CAPSULE BY MOUTH EVERY MORNING BEFORE BREAKFAST 90 capsule 1   glucose blood (ONETOUCH ULTRA) test strip Use as instructed 100 each 5   latanoprost (XALATAN) 0.005 % ophthalmic solution Apply to eye.     NONFORMULARY OR COMPOUNDED ITEM Estradiol vaginal cream 0.02% insert vaginally twice weeky 90 each 4   XARELTO 20 MG TABS tablet TAKE 1 TABLET BY MOUTH DAILY WITH DINNER 90 tablet 1   rivaroxaban (XARELTO) 20 MG TABS tablet Take 20 mg by mouth daily with supper.     rivaroxaban  (XARELTO) 20 MG TABS tablet Take 20 mg by mouth daily with supper.     sucralfate (CARAFATE) 1 g tablet Take 1 tablet (1 g total) by mouth 2 (two) times daily for 14 days. (Patient not taking: Reported on 10/17/2021) 28 tablet 0   No facility-administered medications prior to visit.    ROS Review of Systems  Constitutional: Negative.  Negative for chills, diaphoresis, fatigue and fever.  HENT:  Positive for congestion, postnasal drip and rhinorrhea. Negative for sinus pressure and sore throat.   Eyes: Negative.   Respiratory:  Positive for cough, shortness of breath and wheezing. Negative for chest tightness and stridor.   Cardiovascular:  Negative for chest pain, palpitations and leg swelling.  Gastrointestinal:  Positive for constipation. Negative for abdominal pain, diarrhea, nausea and vomiting.  Endocrine: Negative.   Genitourinary: Negative.  Negative for difficulty urinating.  Musculoskeletal: Negative.  Negative for arthralgias and myalgias.  Skin: Negative.   Neurological:  Negative for dizziness, weakness and light-headedness.  Hematological:  Negative for adenopathy. Does not bruise/bleed easily.  Psychiatric/Behavioral: Negative.      Objective:  BP 134/84 (BP Location: Left Arm, Patient Position: Sitting, Cuff Size: Normal)   Pulse 83   Temp 98.3 F (36.8 C) (Oral)   Resp 16   Ht '5\' 5"'$  (1.651 m)   Wt 190 lb 12.8 oz (86.5 kg)   LMP 03/17/1991  SpO2 94%   BMI 31.75 kg/m   BP Readings from Last 3 Encounters:  05/13/22 134/84  10/17/21 118/66  08/25/21 133/65    Wt Readings from Last 3 Encounters:  05/13/22 190 lb 12.8 oz (86.5 kg)  10/17/21 186 lb (84.4 kg)  08/25/21 187 lb (84.8 kg)    Physical Exam Vitals reviewed.  Constitutional:      Appearance: She is not ill-appearing.  HENT:     Mouth/Throat:     Mouth: Mucous membranes are moist.  Eyes:     General: No scleral icterus.    Conjunctiva/sclera: Conjunctivae normal.  Cardiovascular:     Rate  and Rhythm: Normal rate and regular rhythm.     Heart sounds: Normal heart sounds, S1 normal and S2 normal.  Pulmonary:     Effort: Pulmonary effort is normal. No tachypnea, accessory muscle usage or respiratory distress.     Breath sounds: No stridor. Examination of the left-middle field reveals rhonchi. Rhonchi present. No decreased breath sounds, wheezing or rales.  Abdominal:     General: Abdomen is flat.     Palpations: There is no mass.     Tenderness: There is no abdominal tenderness. There is no guarding.     Hernia: No hernia is present.  Musculoskeletal:        General: Normal range of motion.     Cervical back: Neck supple.     Right lower leg: No edema.     Left lower leg: No edema.  Lymphadenopathy:     Cervical: No cervical adenopathy.  Skin:    General: Skin is warm and dry.  Neurological:     General: No focal deficit present.     Mental Status: She is alert. Mental status is at baseline.  Psychiatric:        Mood and Affect: Mood normal.        Behavior: Behavior normal.     Lab Results  Component Value Date   WBC 5.1 05/13/2022   HGB 13.2 05/13/2022   HCT 39.7 05/13/2022   PLT 210.0 05/13/2022   GLUCOSE 142 (H) 05/13/2022   CHOL 145 05/13/2022   TRIG 71.0 05/13/2022   HDL 44.90 05/13/2022   LDLCALC 86 05/13/2022   ALT 21 05/13/2022   AST 33 05/13/2022   NA 137 05/13/2022   K 3.6 05/13/2022   CL 101 05/13/2022   CREATININE 0.75 05/13/2022   BUN 13 05/13/2022   CO2 28 05/13/2022   TSH 1.76 05/13/2022   INR 1.1 (H) 02/20/2021   HGBA1C 7.6 (H) 05/13/2022   MICROALBUR 2.8 (H) 05/13/2022    NM Hepato W/EF  Result Date: 10/10/2021 CLINICAL DATA:  Chronic abdominal pain. EXAM: NUCLEAR MEDICINE HEPATOBILIARY IMAGING WITH GALLBLADDER EF TECHNIQUE: Sequential images of the abdomen were obtained out to 60 minutes following intravenous administration of radiopharmaceutical. After oral ingestion of Ensure, gallbladder ejection fraction was determined. At 60  min, normal ejection fraction is greater than 33%. RADIOPHARMACEUTICALS:  5.0 mCi Tc-35m Choletec IV COMPARISON:  Right upper quadrant ultrasound July 07, 2021 FINDINGS: Prompt uptake and biliary excretion of activity by the liver is seen. Gallbladder activity is visualized, consistent with patency of cystic duct. Biliary activity passes into small bowel, consistent with patent common bile duct. Calculated gallbladder ejection fraction is 35%. (Normal gallbladder ejection fraction with Ensure is greater than 33%.) IMPRESSION: 1.  Low normal gallbladder ejection fraction. 2.  Patent cystic and common bile ducts. Electronically Signed   By: JDellis Filbert  Nance Pew M.D.   On: 10/10/2021 12:38    Assessment & Plan:   Hanvika was seen today for annual exam, atrial fibrillation, cough, asthma, hypertension, hyperlipidemia and diabetes.  Diagnoses and all orders for this visit:  Essential hypertension, benign- Her blood pressure is adequately well-controlled. -     Cancel: TSH; Future -     Urinalysis, Routine w reflex microscopic; Future -     Basic metabolic panel; Future -     TSH; Future -     TSH -     Basic metabolic panel -     Urinalysis, Routine w reflex microscopic  Nonalcoholic steatohepatitis ([facility])- Her liver enzymes are normal now. -     Hepatic function panel; Future -     Hepatic function panel  Gastroesophageal reflux disease with esophagitis without hemorrhage -     CBC with Differential/Platelet; Future -     CBC with Differential/Platelet  Chronic idiopathic constipation -     Cancel: TSH; Future  Type II diabetes mellitus with manifestations ([place])- Her A1c is too high at 7.6%.  Will start an SGLT2 inhibitor. -     Hemoglobin A1c; Future -     Microalbumin / creatinine urine ratio; Future -     Basic metabolic panel; Future -     Basic metabolic panel -     Microalbumin / creatinine urine ratio -     Hemoglobin A1c -     HM Diabetes Foot Exam -     empagliflozin (JARDIANCE) 10  MG TABS tablet; Take 1 tablet (10 mg total) by mouth daily.  Other osteoporosis, unspecified pathological fracture presence -     VITAMIN D 25 Hydroxy (Vit-D Deficiency, Fractures); Future -     VITAMIN D 25 Hydroxy (Vit-D Deficiency, Fractures) -     Cholecalciferol 50 MCG (2000 UT) TABS; Take 1 tablet (2,000 Units total) by mouth daily.  Hyperlipidemia with target LDL less than 100- I have asked her to take a statin for cardiovascular risk reduction. -     Lipid panel; Future -     TSH; Future -     TSH -     Lipid panel  Secondary hypercoagulable state ([place])- Will continue the DOAC.  Mild intermittent asthma with acute exacerbation -     Albuterol-Budesonide (AIRSUPRA) 90-80 MCG/ACT AERO; Inhale 2 puffs into the lungs 4 (four) times daily as needed.  Encounter for general adult medical examination with abnormal findings- Exam completed, labs reviewed, vaccines reviewed, cancer screenings are up-to-date, patient education was given. -     Discontinue: Zoster Vaccine Adjuvanted [facility]) injection; Inject 0.5 mLs into the muscle once for 1 dose. -     Tdap (BOOSTRIX) 5-2.5-18.5 LF-MCG/0.5 injection; Inject 0.5 mLs into the muscle once for 1 dose.  Mild asthma with exacerbation, unspecified whether persistent -     methylPREDNISolone acetate (DEPO-MEDROL) injection 80 mg  Chronic renal disease, stage 2, mildly decreased glomerular filtration rate (GFR) between 60-89 mL/min/1.73 square meter- Will start an SGLT2 inhibitor for renal protection. -     empagliflozin (JARDIANCE) 10 MG TABS tablet; Take 1 tablet (10 mg total) by mouth daily.  PAF (paroxysmal atrial fibrillation) ([place])- She has good rate and rhythm control.  Will continue the DOAC.   I have discontinued Liset Mcaleese. [place]'s rivaroxaban, rivaroxaban, sucralfate, and Shingrix. I am also having her start on Boostrix, Airsupra, empagliflozin, and Cholecalciferol. Additionally, I am having her maintain her EPINEPHrine,  dorzolamide-timolol, Azelastine-Fluticasone, ONE TOUCH ULTRA 2,  OneTouch Ultra, OT ULTRA/FASTTK CNTRL SOLN, NONFORMULARY OR COMPOUNDED ITEM, clonazePAM, diltiazem, esomeprazole, DULoxetine, Xarelto, and latanoprost. We administered methylPREDNISolone acetate.  Meds ordered this encounter  Medications   DISCONTD: Zoster Vaccine Adjuvanted [facility]) injection    Sig: Inject 0.5 mLs into the muscle once for 1 dose.    Dispense:  0.5 mL    Refill:  1   Tdap (BOOSTRIX) 5-2.5-18.5 LF-MCG/0.5 injection    Sig: Inject 0.5 mLs into the muscle once for 1 dose.    Dispense:  0.5 mL    Refill:  0   Albuterol-Budesonide (AIRSUPRA) 90-80 MCG/ACT AERO    Sig: Inhale 2 puffs into the lungs 4 (four) times daily as needed.    Dispense:  10.7 g    Refill:  5   methylPREDNISolone acetate (DEPO-MEDROL) injection 80 mg   empagliflozin (JARDIANCE) 10 MG TABS tablet    Sig: Take 1 tablet (10 mg total) by mouth daily.    Dispense:  90 tablet    Refill:  0   Cholecalciferol 50 MCG (2000 UT) TABS    Sig: Take 1 tablet (2,000 Units total) by mouth daily.    Dispense:  90 tablet    Refill:  3     Follow-up: Return in about 6 months (around 11/11/2022).  Scarlette Calico, MD

## 2022-05-13 NOTE — Patient Instructions (Signed)

## 2022-05-14 ENCOUNTER — Telehealth: Payer: Self-pay

## 2022-05-14 NOTE — Telephone Encounter (Signed)
Key: [id number]

## 2022-05-14 NOTE — Telephone Encounter (Signed)
Approved Coverage Ends on: 03/16/2023

## 2022-05-15 MED ORDER — ROSUVASTATIN CALCIUM 10 MG PO TABS: 10.0000 mg | ORAL_TABLET | Freq: Every day | ORAL | 1 refills | Status: DC

## 2022-05-18 ENCOUNTER — Encounter: Payer: Self-pay | Admitting: Gastroenterology

## 2022-05-20 DIAGNOSIS — Z961 Presence of intraocular lens: Secondary | ICD-10-CM | POA: Diagnosis not present

## 2022-05-20 DIAGNOSIS — H401412 Capsular glaucoma with pseudoexfoliation of lens, right eye, moderate stage: Secondary | ICD-10-CM | POA: Diagnosis not present

## 2022-05-20 DIAGNOSIS — H401423 Capsular glaucoma with pseudoexfoliation of lens, left eye, severe stage: Secondary | ICD-10-CM | POA: Diagnosis not present

## 2022-05-25 ENCOUNTER — Other Ambulatory Visit: Payer: Self-pay | Admitting: Internal Medicine

## 2022-05-25 DIAGNOSIS — J3089 Other allergic rhinitis: Secondary | ICD-10-CM | POA: Diagnosis not present

## 2022-05-25 DIAGNOSIS — K2100 Gastro-esophageal reflux disease with esophagitis, without bleeding: Secondary | ICD-10-CM

## 2022-05-25 DIAGNOSIS — R052 Subacute cough: Secondary | ICD-10-CM | POA: Diagnosis not present

## 2022-05-25 DIAGNOSIS — K219 Gastro-esophageal reflux disease without esophagitis: Secondary | ICD-10-CM | POA: Diagnosis not present

## 2022-05-25 DIAGNOSIS — J40 Bronchitis, not specified as acute or chronic: Secondary | ICD-10-CM | POA: Diagnosis not present

## 2022-06-01 DIAGNOSIS — J301 Allergic rhinitis due to pollen: Secondary | ICD-10-CM | POA: Diagnosis not present

## 2022-06-01 DIAGNOSIS — J3089 Other allergic rhinitis: Secondary | ICD-10-CM | POA: Diagnosis not present

## 2022-06-01 DIAGNOSIS — J3081 Allergic rhinitis due to animal (cat) (dog) hair and dander: Secondary | ICD-10-CM | POA: Diagnosis not present

## 2022-06-02 DIAGNOSIS — J3089 Other allergic rhinitis: Secondary | ICD-10-CM | POA: Diagnosis not present

## 2022-06-08 ENCOUNTER — Telehealth: Payer: Self-pay

## 2022-06-08 DIAGNOSIS — K828 Other specified diseases of gallbladder: Secondary | ICD-10-CM

## 2022-06-08 DIAGNOSIS — R1084 Generalized abdominal pain: Secondary | ICD-10-CM

## 2022-06-08 DIAGNOSIS — K760 Fatty (change of) liver, not elsewhere classified: Secondary | ICD-10-CM

## 2022-06-08 DIAGNOSIS — K824 Cholesterolosis of gallbladder: Secondary | ICD-10-CM

## 2022-06-08 NOTE — Telephone Encounter (Signed)
-----   Message from Audrea Muscat, [place] sent at 10/17/2021 11:05 AM EDT ----- Patient needs RUQ u/s 06/2022

## 2022-06-08 NOTE — Telephone Encounter (Signed)
RUQ ordered for April for Dr. Silverio Decamp. Scheduled for April 22nd at 9:30am arrive at 9:00am at [facility] [place] after midnight. MyChart message to patient

## 2022-06-16 NOTE — Telephone Encounter (Signed)
Called and LM for patient that she has been scheduled for a RUQ U/S for Dr. Silverio Decamp on April 22nd (Monday) at 9:30 am, to arrive at 9:00am and be NPO after midnight. Patient can call to reschedule:  [phone] if needed.

## 2022-06-23 NOTE — Telephone Encounter (Signed)
Patient is returning phone call. Would like to discuss. Please advise.

## 2022-06-29 ENCOUNTER — Other Ambulatory Visit: Payer: Self-pay | Admitting: Internal Medicine

## 2022-06-29 DIAGNOSIS — F418 Other specified anxiety disorders: Secondary | ICD-10-CM

## 2022-07-05 ENCOUNTER — Other Ambulatory Visit: Payer: Self-pay | Admitting: Internal Medicine

## 2022-07-05 DIAGNOSIS — F40243 Fear of flying: Secondary | ICD-10-CM

## 2022-07-06 ENCOUNTER — Ambulatory Visit (HOSPITAL_COMMUNITY)
Admission: RE | Admit: 2022-07-06 | Discharge: 2022-07-06 | Disposition: A | Discharge status: Home / Self Care | Payer: Medicare PPO | Source: Ambulatory Visit | Attending: Gastroenterology | Admitting: Gastroenterology

## 2022-07-06 DIAGNOSIS — K824 Cholesterolosis of gallbladder: Secondary | ICD-10-CM | POA: Insufficient documentation

## 2022-07-06 DIAGNOSIS — R1084 Generalized abdominal pain: Secondary | ICD-10-CM | POA: Diagnosis not present

## 2022-07-06 DIAGNOSIS — K828 Other specified diseases of gallbladder: Secondary | ICD-10-CM | POA: Diagnosis not present

## 2022-07-06 DIAGNOSIS — K760 Fatty (change of) liver, not elsewhere classified: Secondary | ICD-10-CM | POA: Insufficient documentation

## 2022-07-06 MED ORDER — CLONAZEPAM 0.5 MG PO TABS: 0.5000 mg | ORAL_TABLET | Freq: Two times a day (BID) | ORAL | 3 refills | Status: DC | PRN

## 2022-07-07 DIAGNOSIS — J3089 Other allergic rhinitis: Secondary | ICD-10-CM | POA: Diagnosis not present

## 2022-07-07 DIAGNOSIS — J3081 Allergic rhinitis due to animal (cat) (dog) hair and dander: Secondary | ICD-10-CM | POA: Diagnosis not present

## 2022-07-07 DIAGNOSIS — J301 Allergic rhinitis due to pollen: Secondary | ICD-10-CM | POA: Diagnosis not present

## 2022-07-09 DIAGNOSIS — J3089 Other allergic rhinitis: Secondary | ICD-10-CM | POA: Diagnosis not present

## 2022-07-28 DIAGNOSIS — J3081 Allergic rhinitis due to animal (cat) (dog) hair and dander: Secondary | ICD-10-CM | POA: Diagnosis not present

## 2022-07-28 DIAGNOSIS — J3089 Other allergic rhinitis: Secondary | ICD-10-CM | POA: Diagnosis not present

## 2022-07-28 DIAGNOSIS — J301 Allergic rhinitis due to pollen: Secondary | ICD-10-CM | POA: Diagnosis not present

## 2022-07-31 ENCOUNTER — Other Ambulatory Visit: Payer: Self-pay | Admitting: Cardiology

## 2022-07-31 DIAGNOSIS — J3089 Other allergic rhinitis: Secondary | ICD-10-CM | POA: Diagnosis not present

## 2022-08-04 DIAGNOSIS — J3089 Other allergic rhinitis: Secondary | ICD-10-CM | POA: Diagnosis not present

## 2022-08-06 DIAGNOSIS — J3089 Other allergic rhinitis: Secondary | ICD-10-CM | POA: Diagnosis not present

## 2022-08-12 DIAGNOSIS — J3081 Allergic rhinitis due to animal (cat) (dog) hair and dander: Secondary | ICD-10-CM | POA: Diagnosis not present

## 2022-08-12 DIAGNOSIS — J3089 Other allergic rhinitis: Secondary | ICD-10-CM | POA: Diagnosis not present

## 2022-08-12 DIAGNOSIS — J301 Allergic rhinitis due to pollen: Secondary | ICD-10-CM | POA: Diagnosis not present

## 2022-08-17 DIAGNOSIS — J3081 Allergic rhinitis due to animal (cat) (dog) hair and dander: Secondary | ICD-10-CM | POA: Diagnosis not present

## 2022-08-17 DIAGNOSIS — J3089 Other allergic rhinitis: Secondary | ICD-10-CM | POA: Diagnosis not present

## 2022-08-17 DIAGNOSIS — J301 Allergic rhinitis due to pollen: Secondary | ICD-10-CM | POA: Diagnosis not present

## 2022-08-27 DIAGNOSIS — J3089 Other allergic rhinitis: Secondary | ICD-10-CM | POA: Diagnosis not present

## 2022-08-30 ENCOUNTER — Other Ambulatory Visit: Payer: Self-pay | Admitting: Cardiology

## 2022-09-04 DIAGNOSIS — J3081 Allergic rhinitis due to animal (cat) (dog) hair and dander: Secondary | ICD-10-CM | POA: Diagnosis not present

## 2022-09-04 DIAGNOSIS — J301 Allergic rhinitis due to pollen: Secondary | ICD-10-CM | POA: Diagnosis not present

## 2022-09-04 DIAGNOSIS — J3089 Other allergic rhinitis: Secondary | ICD-10-CM | POA: Diagnosis not present

## 2022-09-10 DIAGNOSIS — J3081 Allergic rhinitis due to animal (cat) (dog) hair and dander: Secondary | ICD-10-CM | POA: Diagnosis not present

## 2022-09-10 DIAGNOSIS — J3089 Other allergic rhinitis: Secondary | ICD-10-CM | POA: Diagnosis not present

## 2022-09-10 DIAGNOSIS — J301 Allergic rhinitis due to pollen: Secondary | ICD-10-CM | POA: Diagnosis not present

## 2022-09-15 DIAGNOSIS — J301 Allergic rhinitis due to pollen: Secondary | ICD-10-CM | POA: Diagnosis not present

## 2022-09-15 DIAGNOSIS — J3081 Allergic rhinitis due to animal (cat) (dog) hair and dander: Secondary | ICD-10-CM | POA: Diagnosis not present

## 2022-09-15 DIAGNOSIS — J3089 Other allergic rhinitis: Secondary | ICD-10-CM | POA: Diagnosis not present

## 2022-09-21 DIAGNOSIS — J3081 Allergic rhinitis due to animal (cat) (dog) hair and dander: Secondary | ICD-10-CM | POA: Diagnosis not present

## 2022-09-21 DIAGNOSIS — J3089 Other allergic rhinitis: Secondary | ICD-10-CM | POA: Diagnosis not present

## 2022-09-21 DIAGNOSIS — J301 Allergic rhinitis due to pollen: Secondary | ICD-10-CM | POA: Diagnosis not present

## 2022-09-28 DIAGNOSIS — J3081 Allergic rhinitis due to animal (cat) (dog) hair and dander: Secondary | ICD-10-CM | POA: Diagnosis not present

## 2022-09-28 DIAGNOSIS — J301 Allergic rhinitis due to pollen: Secondary | ICD-10-CM | POA: Diagnosis not present

## 2022-09-28 DIAGNOSIS — J3089 Other allergic rhinitis: Secondary | ICD-10-CM | POA: Diagnosis not present

## 2022-10-03 ENCOUNTER — Other Ambulatory Visit: Payer: Self-pay | Admitting: Internal Medicine

## 2022-10-03 DIAGNOSIS — E118 Type 2 diabetes mellitus with unspecified complications: Secondary | ICD-10-CM

## 2022-10-03 DIAGNOSIS — F418 Other specified anxiety disorders: Secondary | ICD-10-CM

## 2022-10-03 DIAGNOSIS — N182 Chronic kidney disease, stage 2 (mild): Secondary | ICD-10-CM

## 2022-10-05 ENCOUNTER — Other Ambulatory Visit: Payer: Self-pay | Admitting: Internal Medicine

## 2022-10-05 DIAGNOSIS — F418 Other specified anxiety disorders: Secondary | ICD-10-CM

## 2022-10-05 MED ORDER — DULOXETINE HCL 60 MG PO CPEP: 60.0000 mg | ORAL_CAPSULE | Freq: Every day | ORAL | 0 refills | Status: DC

## 2022-10-06 DIAGNOSIS — J3081 Allergic rhinitis due to animal (cat) (dog) hair and dander: Secondary | ICD-10-CM | POA: Diagnosis not present

## 2022-10-06 DIAGNOSIS — J301 Allergic rhinitis due to pollen: Secondary | ICD-10-CM | POA: Diagnosis not present

## 2022-10-06 DIAGNOSIS — J3089 Other allergic rhinitis: Secondary | ICD-10-CM | POA: Diagnosis not present

## 2022-10-08 DIAGNOSIS — M7061 Trochanteric bursitis, right hip: Secondary | ICD-10-CM | POA: Diagnosis not present

## 2022-10-09 DIAGNOSIS — J3089 Other allergic rhinitis: Secondary | ICD-10-CM | POA: Diagnosis not present

## 2022-10-12 ENCOUNTER — Telehealth: Payer: Self-pay | Admitting: Internal Medicine

## 2022-10-12 ENCOUNTER — Other Ambulatory Visit: Payer: Self-pay | Admitting: *Deleted

## 2022-10-12 DIAGNOSIS — J3081 Allergic rhinitis due to animal (cat) (dog) hair and dander: Secondary | ICD-10-CM | POA: Diagnosis not present

## 2022-10-12 DIAGNOSIS — J301 Allergic rhinitis due to pollen: Secondary | ICD-10-CM | POA: Diagnosis not present

## 2022-10-12 DIAGNOSIS — J3089 Other allergic rhinitis: Secondary | ICD-10-CM | POA: Diagnosis not present

## 2022-10-12 MED ORDER — NONFORMULARY OR COMPOUNDED ITEM: 0 refills | Status: DC

## 2022-10-12 MED ORDER — NONFORMULARY OR COMPOUNDED ITEM: 1 refills | Status: DC

## 2022-10-12 NOTE — Telephone Encounter (Signed)
Prescription Request  10/12/2022  LOV: 05/13/2022  What is the name of the medication or equipment? ambien  Have you contacted your pharmacy to request a refill? Yes   Which pharmacy would you like this sent to?  [id number] [place], [place] - [address] [place] [place] [address] Phone: [phone] Fax: [phone]   Patient notified that their request is being sent to the clinical staff for review and that they should receive a response within 2 business days.   Please advise at Mobile [phone] (mobile)

## 2022-10-12 NOTE — Telephone Encounter (Signed)
Call returned to patient.  Requesting refill of Compounded estradiol vaginal cream 2%, vaginally twice weekly. To [facility].   AEX 12/27/20 -ML MMG 12/02/21 Neg Birads2  Spoke with patient. States she has just run out of the Rx sent from 12/2020. States she was not aware she would need an OV between Breast & Pelvic exam.   B&P exam scheduled for 12/29/22 with Tiffany. Advised I will have to review refill request and return call with recommendations. Patient agreeable.   Tiffany -please review and advise on refill.

## 2022-10-12 NOTE — Telephone Encounter (Signed)
OK to keep annual visit in October. No need for OV prior. Thanks.

## 2022-10-12 NOTE — Telephone Encounter (Signed)
1 gram. Thanks.

## 2022-10-12 NOTE — Telephone Encounter (Signed)
Tiffany -I will need to phone in this Rx. Will you confirm the dosage to insert twice weekly.

## 2022-10-12 NOTE — Addendum Note (Signed)
Addended by: Leda Min on: 10/12/2022 12:02 PM   Modules accepted: Orders

## 2022-10-12 NOTE — Telephone Encounter (Signed)
Rx updated.   Call placed to [facility], spoke with Dusty.  Verbal order given: Compounded Estradiol vaginal cream 2%. Insert 1 gram vaginally twice weekly. Dispense 24 grams/0RF.   Read back and confirmed.  Patient notified.    Routing to provider for final review.  Will close encounter.

## 2022-10-12 NOTE — Telephone Encounter (Signed)
LVM stating that an OV is needed prior to refill as she is overdue for her 76mo f/u.

## 2022-10-25 NOTE — Progress Notes (Unsigned)
Cardiology Office Note Date:  10/25/2022  Patient ID:  Diana Reeves, Diana Reeves Jul 23, 1950, MRN [id number] PCP:  Etta Grandchild, MD  Cardiologist:  Dr. Royann Shivers Electrophysiologist: Dr. Lalla Brothers  Chief Complaint:  over due annual visit  History of Present Illness: Diana Reeves is a 72 y.o. female with history of NASH, DM, obesity, HTN, AFib  She saw Dr. Lalla Brothers 07/23/21, doing well though reported increased edema in increased dilt dose.  Advised compression stockigs No med changes made  TODAY She feels very well No CP, palpitations or cardiac awareness. Her Afib made her tired, winded, easily fatigued >> all of wic remain resolved She does not thin she has had any AFib since her ablation No SOB, DOE She still works part time as a Systems developer, does water aerobics and active with her grand kids No exertional intolerances No near syncope or syncope. No bleeding or signs of bleeding    AFib/AAD hx Dx Aug 2022 PVI ablation 04/21/21 No AAD to date  Past Medical History:  Diagnosis Date   A-fib [facility])    Allergic state 12/26/2016   Basal cell carcinoma    Benign neoplasm of colon    Degenerative cervical disc    Depression    Diverticulosis of colon (without mention of hemorrhage)    Esophageal reflux    Glaucoma    Osteoporosis, unspecified 12/2018   2007 T score -3.0, 2012 T score -1.9, 2015 T score -2.1 overall stable, 2018 T score -2.0, 2020 T score -1.8   Other chronic nonalcoholic liver disease    Sinusitis 12/26/2016   Stricture and stenosis of esophagus     Past Surgical History:  Procedure Laterality Date   ABDOMINAL HYSTERECTOMY  1993   TAH-LSO-Post. repair-Burch   ATRIAL FIBRILLATION ABLATION N/A 04/21/2021   Procedure: ATRIAL FIBRILLATION ABLATION;  Surgeon: Lanier Prude, MD;  Location: [facility] INVASIVE CV LAB;  Service: Cardiovascular;  Laterality: N/A;   CARDIOVERSION N/A 11/28/2020   Procedure: CARDIOVERSION;  Surgeon: Jake Bathe, MD;  Location:  [facility] ENDOSCOPY;  Service: Cardiovascular;  Laterality: N/A;   CARDIOVERSION N/A 01/27/2021   Procedure: CARDIOVERSION;  Surgeon: Pricilla Riffle, MD;  Location: [facility] ENDOSCOPY;  Service: Cardiovascular;  Laterality: N/A;   COLONOSCOPY     EYE SURGERY  10/18/2012   lt eye   knee arthroscop Right 2018   left arm surgery  7-09   POLYPECTOMY      Current Outpatient Medications  Medication Sig Dispense Refill   Albuterol-Budesonide (AIRSUPRA) 90-80 MCG/ACT AERO Inhale 2 puffs into the lungs 4 (four) times daily as needed. 10.7 g 5   Azelastine-Fluticasone 137-50 MCG/ACT SUSP Place 1 spray into both nostrils daily as needed (allergies).     Blood Glucose Calibration (OT ULTRA/FASTTK CNTRL SOLN) SOLN 1 Act by In Vitro route 2 (two) times daily. 1 each 2   Blood Glucose Monitoring Suppl (ONE TOUCH ULTRA 2) w/Device KIT 1 Act by Does not apply route 2 (two) times daily. 1 kit 1   Cholecalciferol 50 MCG (2000 UT) TABS Take 1 tablet (2,000 Units total) by mouth daily. 90 tablet 3   clonazePAM (KLONOPIN) 0.5 MG tablet Take 1 tablet (0.5 mg total) by mouth 2 (two) times daily as needed for anxiety. 60 tablet 3   diltiazem (CARDIZEM CD) 180 MG 24 hr capsule TAKE 1 CAPSULE BY MOUTH DAILY **MUST CALL MD FOR APPOINTMENT, [phone] 90 capsule 0   dorzolamide-timolol (COSOPT) 22.3-6.8 MG/ML ophthalmic solution Place 1  drop into the right eye 2 (two) times daily.     DULoxetine (CYMBALTA) 60 MG capsule Take 1 capsule (60 mg total) by mouth daily. Per MD Return in about 6 months (around 11/11/2022) must see MD for future refills 30 capsule 0   empagliflozin (JARDIANCE) 10 MG TABS tablet Take 1 tablet (10 mg total) by mouth daily. 90 tablet 0   EPINEPHrine 0.3 mg/0.3 mL IJ SOAJ injection Inject 0.3 mg into the muscle as needed for anaphylaxis.     esomeprazole (NEXIUM) 40 MG capsule TAKE ONE CAPSULE BY MOUTH EVERY MORNING BEFORE BREAKFAST 90 capsule 1   glucose blood (ONETOUCH ULTRA) test strip Use as instructed 100  each 5   latanoprost (XALATAN) 0.005 % ophthalmic solution Apply to eye.     NONFORMULARY OR COMPOUNDED ITEM Estradiol vaginal cream 0.02% insert 1 gram vaginally twice weekly.  Dispense 24 grams/0RF 1 each 0   rosuvastatin (CRESTOR) 10 MG tablet Take 1 tablet (10 mg total) by mouth daily. 90 tablet 1   XARELTO 20 MG TABS tablet TAKE 1 TABLET BY MOUTH DAILY WITH DINNER 90 tablet 1   No current facility-administered medications for this visit.    Allergies:   Dust mite extract and Mold extract [trichophyton mentagrophyte]   Social History:  The patient  reports that she has quit smoking. She has never used smokeless tobacco. She reports that she does not currently use alcohol. She reports that she does not use drugs.   Family History:  The patient's family history includes Cancer in her father, maternal grandmother, and son; Colon cancer (age of onset: 16) in her father; Diabetes in her paternal grandfather; Heart attack in her son; Heart disease in her mother; Hypertension in her father.  ROS:  Please see the history of present illness.    All other systems are reviewed and otherwise negative.   PHYSICAL EXAM:  VS:  LMP 03/17/1991  BMI: There is no height or weight on file to calculate BMI. Well nourished, well developed, in no acute distress HEENT: normocephalic, atraumatic Neck: no JVD, carotid bruits or masses Cardiac:  RRR; no significant murmurs, no rubs, or gallops Lungs:  CTA b/l, no wheezing, rhonchi or rales Abd: soft, nontender MS: no deformity or atrophy Ext:  no edema Skin: warm and dry, no rash Neuro:  No gross deficits appreciated Psych: euthymic mood, full affect   EKG:  Done today and reviewed by myself shows  SR 66bpm   04/21/21: TTE CONCLUSIONS: 1. Successful PVI 2. Successful ablation/isolation of the posterior wall 3. Intracardiac echo reveals trivial pericardial effusion and normal left atrial architecture 4. No early apparent complications. 5.   Colchicine 0.6 mg by mouth twice daily for 5 days 6.  Continue home PPI   11/07/20: TTE  1. Left ventricular ejection fraction, by estimation, is 50 to 55%. The  left ventricle has low normal function. The left ventricle has no regional  wall motion abnormalities. Left ventricular diastolic function could not  be evaluated.   2. Right ventricular systolic function is normal. The right ventricular  size is normal. There is normal pulmonary artery systolic pressure.   3. The mitral valve is normal in structure. Mild mitral valve  regurgitation. No evidence of mitral stenosis.   4. The aortic valve is grossly normal. Aortic valve regurgitation is  mild. No aortic stenosis is present.    Recent Labs: 05/13/2022: ALT 21; BUN 13; Creatinine, Ser 0.75; Hemoglobin 13.2; Platelets 210.0; Potassium 3.6; Sodium 137; TSH  1.76  05/13/2022: Cholesterol 145; HDL 44.90; LDL Cholesterol 86; Total CHOL/HDL Ratio 3; Triglycerides 71.0; VLDL 14.2   CrCl cannot be calculated (Patient's most recent lab result is older than the maximum 21 days allowed.).   Wt Readings from Last 3 Encounters:  05/13/22 190 lb 12.8 oz (86.5 kg)  10/17/21 186 lb (84.4 kg)  08/25/21 187 lb (84.8 kg)     Other studies reviewed: Additional studies/records reviewed today include: summarized above  ASSESSMENT AND PLAN:  Persistent Afib CHA2DS2Vasc is 4, on Xarelto,  appropriately dosed No/low burden post ablation  Labs today  HTN Looks ok  Secondary hypercoagulable state  Disposition: F/u with [place] again in a year, sooner if needed  Current medicines are reviewed at length with the patient today.  The patient did not have any concerns regarding medicines.  Norma Fredrickson, PA-C 10/25/2022 11:04 AM     [facility] [address] [place] [address] [phone] (office)  [phone] (fax)

## 2022-10-27 ENCOUNTER — Encounter: Payer: Self-pay | Admitting: Physician Assistant

## 2022-10-27 ENCOUNTER — Ambulatory Visit: Payer: Medicare PPO | Attending: Physician Assistant | Admitting: Physician Assistant

## 2022-10-27 VITALS — BP 114/80 | HR 66 | Ht 65.0 in | Wt 194.2 lb

## 2022-10-27 DIAGNOSIS — Z79899 Other long term (current) drug therapy: Secondary | ICD-10-CM | POA: Diagnosis not present

## 2022-10-27 DIAGNOSIS — I4819 Other persistent atrial fibrillation: Secondary | ICD-10-CM | POA: Diagnosis not present

## 2022-10-27 DIAGNOSIS — I10 Essential (primary) hypertension: Secondary | ICD-10-CM | POA: Diagnosis not present

## 2022-10-27 DIAGNOSIS — D6869 Other thrombophilia: Secondary | ICD-10-CM | POA: Diagnosis not present

## 2022-10-27 DIAGNOSIS — J3089 Other allergic rhinitis: Secondary | ICD-10-CM | POA: Diagnosis not present

## 2022-10-27 DIAGNOSIS — J3081 Allergic rhinitis due to animal (cat) (dog) hair and dander: Secondary | ICD-10-CM | POA: Diagnosis not present

## 2022-10-27 DIAGNOSIS — J301 Allergic rhinitis due to pollen: Secondary | ICD-10-CM | POA: Diagnosis not present

## 2022-10-27 LAB — CBC

## 2022-10-27 LAB — BASIC METABOLIC PANEL
BUN/Creatinine Ratio: 20 (ref 12–28)
BUN: 16 mg/dL (ref 8–27)
CO2: 23 mmol/L (ref 20–29)
Calcium: 9.7 mg/dL (ref 8.7–10.3)
Chloride: 102 mmol/L (ref 96–106)
Creatinine, Ser: 0.79 mg/dL (ref 0.57–1.00)
Glucose: 134 mg/dL — ABNORMAL HIGH (ref 70–99)
Potassium: 4 mmol/L (ref 3.5–5.2)
Sodium: 139 mmol/L (ref 134–144)
eGFR: 78 mL/min/{1.73_m2} (ref >59)

## 2022-10-27 NOTE — Patient Instructions (Addendum)
Medication Instructions:    Your physician recommends that you continue on your current medications as directed. Please refer to the Current Medication list given to you today.  *If you need a refill on your cardiac medications before your next appointment, please call your pharmacy*   Lab Work: BMET AND CBC TODAY   If you have labs (blood work) drawn today and your tests are completely normal, you will receive your results only by: MyChart Message (if you have MyChart) OR A paper copy in the mail If you have any lab test that is abnormal or we need to change your treatment, we will call you to review the results.   Testing/Procedures: NONE ORDERED  TODAY   Follow-Up: At [facility], you and your health needs are our priority.  As part of our continuing mission to provide you with exceptional heart care, we have created designated Provider Care Teams.  These Care Teams include your primary Cardiologist (physician) and Advanced Practice Providers (APPs -  Physician Assistants and Nurse Practitioners) who all work together to provide you with the care you need, when you need it.  We recommend signing up for the patient portal called "MyChart".  Sign up information is provided on this After Visit Summary.  MyChart is used to connect with patients for Virtual Visits (Telemedicine).  Patients are able to view lab/test results, encounter notes, upcoming appointments, etc.  Non-urgent messages can be sent to your provider as well.   To learn more about what you can do with MyChart, go to [email or web address].    Your next appointment:   1 year(s)  Provider:   You may see Lanier Prude, MD  or one of the following Advanced Practice Providers on your designated Care Team:   Francis Dowse, [place]  Other Instructions

## 2022-10-28 ENCOUNTER — Ambulatory Visit (INDEPENDENT_AMBULATORY_CARE_PROVIDER_SITE_OTHER): Payer: Medicare PPO

## 2022-10-28 VITALS — Ht 65.0 in | Wt 194.0 lb

## 2022-10-28 DIAGNOSIS — Z0000 Encounter for general adult medical examination without abnormal findings: Secondary | ICD-10-CM | POA: Diagnosis not present

## 2022-10-28 NOTE — Patient Instructions (Addendum)
[name] , Thank you for taking time to come for your Medicare Wellness Visit. I appreciate your ongoing commitment to your health goals. Please review the following plan we discussed and let me know if I can assist you in the future.   Referrals/Orders/Follow-Ups/Clinician Recommendations:   This is a list of the screening recommended for you and due dates:  Health Maintenance  Topic Date Due   Zoster (Shingles) Vaccine (1 of 2) 06/10/1967   DTaP/Tdap/Td vaccine (2 - Td or Tdap) 10/07/2020   COVID-19 Vaccine (6 - 2023-24 season) 02/17/2022   Eye exam for diabetics  07/16/2022   Colon Cancer Screening  08/26/2022   Flu Shot  10/15/2022   Hemoglobin A1C  11/11/2022   Mammogram  12/03/2022   Yearly kidney health urinalysis for diabetes  05/14/2023   Complete foot exam   05/14/2023   Yearly kidney function blood test for diabetes  10/27/2023   Medicare Annual Wellness Visit  10/28/2023   Pneumonia Vaccine  Completed   DEXA scan (bone density measurement)  Completed   Hepatitis C Screening  Completed   HPV Vaccine  Aged Out    Advanced directives: (Copy Requested) Please bring a copy of your health care power of attorney and living will to the office to be added to your chart at your convenience.  Next Medicare Annual Wellness Visit scheduled for next year: Yes  Preventive Care 39 Years and Older, Female Preventive care refers to lifestyle choices and visits with your health care provider that can promote health and wellness. What does preventive care include? A yearly physical exam. This is also called an annual well check. Dental exams once or twice a year. Routine eye exams. Ask your health care provider how often you should have your eyes checked. Personal lifestyle choices, including: Daily care of your teeth and gums. Regular physical activity. Eating a healthy diet. Avoiding tobacco and drug use. Limiting alcohol use. Practicing safe sex. Taking low-dose aspirin every  day. Taking vitamin and mineral supplements as recommended by your health care provider. What happens during an annual well check? The services and screenings done by your health care provider during your annual well check will depend on your age, overall health, lifestyle risk factors, and family history of disease. Counseling  Your health care provider may ask you questions about your: Alcohol use. Tobacco use. Drug use. Emotional well-being. Home and relationship well-being. Sexual activity. Eating habits. History of falls. Memory and ability to understand (cognition). Work and work Astronomer. Reproductive health. Screening  You may have the following tests or measurements: Height, weight, and BMI. Blood pressure. Lipid and cholesterol levels. These may be checked every 5 years, or more frequently if you are over 65 years old. Skin check. Lung cancer screening. You may have this screening every year starting at age 16 if you have a 30-pack-year history of smoking and currently smoke or have quit within the past 15 years. Fecal occult blood test (FOBT) of the stool. You may have this test every year starting at age 23. Flexible sigmoidoscopy or colonoscopy. You may have a sigmoidoscopy every 5 years or a colonoscopy every 10 years starting at age 68. Hepatitis C blood test. Hepatitis B blood test. Sexually transmitted disease (STD) testing. Diabetes screening. This is done by checking your blood sugar (glucose) after you have not eaten for a while (fasting). You may have this done every 1-3 years. Bone density scan. This is done to screen for osteoporosis. You may have  this done starting at age 87. Mammogram. This may be done every 1-2 years. Talk to your health care provider about how often you should have regular mammograms. Talk with your health care provider about your test results, treatment options, and if necessary, the need for more tests. Vaccines  Your health care  provider may recommend certain vaccines, such as: Influenza vaccine. This is recommended every year. Tetanus, diphtheria, and acellular pertussis (Tdap, Td) vaccine. You may need a Td booster every 10 years. Zoster vaccine. You may need this after age 56. Pneumococcal 13-valent conjugate (PCV13) vaccine. One dose is recommended after age 84. Pneumococcal polysaccharide (PPSV23) vaccine. One dose is recommended after age 33. Talk to your health care provider about which screenings and vaccines you need and how often you need them. This information is not intended to replace advice given to you by your health care provider. Make sure you discuss any questions you have with your health care provider. Document Released: 03/29/2015 Document Revised: 11/20/2015 Document Reviewed: 01/01/2015 Elsevier Interactive Patient Education  2017 [employer].  Fall Prevention in the Home Falls can cause injuries. They can happen to people of all ages. There are many things you can do to make your home safe and to help prevent falls. What can I do on the outside of my home? Regularly fix the edges of walkways and driveways and fix any cracks. Remove anything that might make you trip as you walk through a door, such as a raised step or threshold. Trim any bushes or trees on the path to your home. Use bright outdoor lighting. Clear any walking paths of anything that might make someone trip, such as rocks or tools. Regularly check to see if handrails are loose or broken. Make sure that both sides of any steps have handrails. Any raised decks and porches should have guardrails on the edges. Have any leaves, snow, or ice cleared regularly. Use sand or salt on walking paths during winter. Clean up any spills in your garage right away. This includes oil or grease spills. What can I do in the bathroom? Use night lights. Install grab bars by the toilet and in the tub and shower. Do not use towel bars as grab  bars. Use non-skid mats or decals in the tub or shower. If you need to sit down in the shower, use a plastic, non-slip stool. Keep the floor dry. Clean up any water that spills on the floor as soon as it happens. Remove soap buildup in the tub or shower regularly. Attach bath mats securely with double-sided non-slip rug tape. Do not have throw rugs and other things on the floor that can make you trip. What can I do in the bedroom? Use night lights. Make sure that you have a light by your bed that is easy to reach. Do not use any sheets or blankets that are too big for your bed. They should not hang down onto the floor. Have a firm chair that has side arms. You can use this for support while you get dressed. Do not have throw rugs and other things on the floor that can make you trip. What can I do in the kitchen? Clean up any spills right away. Avoid walking on wet floors. Keep items that you use a lot in easy-to-reach places. If you need to reach something above you, use a strong step stool that has a grab bar. Keep electrical cords out of the way. Do not use floor polish or  wax that makes floors slippery. If you must use wax, use non-skid floor wax. Do not have throw rugs and other things on the floor that can make you trip. What can I do with my stairs? Do not leave any items on the stairs. Make sure that there are handrails on both sides of the stairs and use them. Fix handrails that are broken or loose. Make sure that handrails are as long as the stairways. Check any carpeting to make sure that it is firmly attached to the stairs. Fix any carpet that is loose or worn. Avoid having throw rugs at the top or bottom of the stairs. If you do have throw rugs, attach them to the floor with carpet tape. Make sure that you have a light switch at the top of the stairs and the bottom of the stairs. If you do not have them, ask someone to add them for you. What else can I do to help prevent  falls? Wear shoes that: Do not have high heels. Have rubber bottoms. Are comfortable and fit you well. Are closed at the toe. Do not wear sandals. If you use a stepladder: Make sure that it is fully opened. Do not climb a closed stepladder. Make sure that both sides of the stepladder are locked into place. Ask someone to hold it for you, if possible. Clearly mark and make sure that you can see: Any grab bars or handrails. First and last steps. Where the edge of each step is. Use tools that help you move around (mobility aids) if they are needed. These include: Canes. Walkers. Scooters. Crutches. Turn on the lights when you go into a dark area. Replace any light bulbs as soon as they burn out. Set up your furniture so you have a clear path. Avoid moving your furniture around. If any of your floors are uneven, fix them. If there are any pets around you, be aware of where they are. Review your medicines with your doctor. Some medicines can make you feel dizzy. This can increase your chance of falling. Ask your doctor what other things that you can do to help prevent falls. This information is not intended to replace advice given to you by your health care provider. Make sure you discuss any questions you have with your health care provider. Document Released: 12/27/2008 Document Revised: 08/08/2015 Document Reviewed: 04/06/2014 Elsevier Interactive Patient Education  2017 [employer].

## 2022-10-28 NOTE — Progress Notes (Signed)
Subjective:   Diana Reeves is a 72 y.o. female who presents for Medicare Annual (Subsequent) preventive examination.  Visit Complete: Virtual  I connected with  Diana Reeves on 10/28/22 by a audio enabled telemedicine application and verified that I am speaking with the correct person using two identifiers.  Patient Location: Home  Provider Location: Home Office  I discussed the limitations of evaluation and management by telemedicine. The patient expressed understanding and agreed to proceed.  Patient Medicare AWV questionnaire was completed by the patient on 10/25/22; I have confirmed that all information answered by patient is correct and no changes since this date.  Review of Systems    Vital Signs: Unable to obtain new vitals due to this being a telehealth visit.  Cardiac Risk Factors include: advanced age (>67men, >46 women);diabetes mellitus;hypertension     Objective:    Today's Vitals   10/28/22 0955  Weight: 194 lb (88 kg)  Height: 5\' 5"  (1.651 m)   Body mass index is 32.28 kg/m.     10/28/2022   10:03 AM 09/26/2021    1:13 PM 04/21/2021    9:27 AM 01/27/2021    8:52 AM 11/28/2020    7:50 AM 06/12/2020    1:52 PM 01/30/2019    8:57 AM  Advanced Directives  Does Patient Have a Medical Advance Directive? Yes Yes Yes Yes Yes Yes Yes  Type of Estate agent of [place];Living will Healthcare Power of [place];Living will Healthcare Power of [place];Living will  Healthcare Power of [place];Living will Living will;Healthcare Power of [employer] Power of [place];Living will  Does patient want to make changes to medical advance directive?   No - Patient declined   No - Patient declined   Copy of Healthcare Power of Attorney in Chart? No - copy requested No - copy requested No - copy requested  Yes - validated most recent copy scanned in chart (See row information) No - copy requested No - copy requested    Current Medications  (verified) Outpatient Encounter Medications as of 10/28/2022  Medication Sig   Azelastine-Fluticasone 137-50 MCG/ACT SUSP Place 1 spray into both nostrils daily as needed (allergies).   Blood Glucose Calibration (OT ULTRA/FASTTK CNTRL SOLN) SOLN 1 Act by In Vitro route 2 (two) times daily.   Blood Glucose Monitoring Suppl (ONE TOUCH ULTRA 2) w/Device KIT 1 Act by Does not apply route 2 (two) times daily.   Cholecalciferol 50 MCG (2000 UT) TABS Take 1 tablet (2,000 Units total) by mouth daily.   clonazePAM (KLONOPIN) 0.5 MG tablet Take 1 tablet (0.5 mg total) by mouth 2 (two) times daily as needed for anxiety.   diltiazem (CARDIZEM CD) 180 MG 24 hr capsule TAKE 1 CAPSULE BY MOUTH DAILY **MUST CALL MD FOR APPOINTMENT, [phone]   dorzolamide-timolol (COSOPT) 22.3-6.8 MG/ML ophthalmic solution Place 1 drop into the right eye 2 (two) times daily.   DULoxetine (CYMBALTA) 60 MG capsule Take 1 capsule (60 mg total) by mouth daily. Per MD Return in about 6 months (around 11/11/2022) must see MD for future refills   empagliflozin (JARDIANCE) 10 MG TABS tablet Take 1 tablet (10 mg total) by mouth daily.   EPINEPHrine 0.3 mg/0.3 mL IJ SOAJ injection Inject 0.3 mg into the muscle as needed for anaphylaxis.   esomeprazole (NEXIUM) 40 MG capsule TAKE ONE CAPSULE BY MOUTH EVERY MORNING BEFORE BREAKFAST   glucose blood (ONETOUCH ULTRA) test strip Use as instructed   latanoprost (XALATAN) 0.005 % ophthalmic solution Apply  to eye.   NONFORMULARY OR COMPOUNDED ITEM Estradiol vaginal cream 0.02% insert 1 gram vaginally twice weekly.  Dispense 24 grams/0RF   rosuvastatin (CRESTOR) 10 MG tablet Take 1 tablet (10 mg total) by mouth daily.   XARELTO 20 MG TABS tablet TAKE 1 TABLET BY MOUTH DAILY WITH DINNER   [DISCONTINUED] Albuterol-Budesonide (AIRSUPRA) 90-80 MCG/ACT AERO Inhale 2 puffs into the lungs 4 (four) times daily as needed.   No facility-administered encounter medications on file as of 10/28/2022.     Allergies (verified) Dust mite extract and Mold extract [trichophyton mentagrophyte]   History: Past Medical History:  Diagnosis Date   A-fib (HCC)    Allergic state 12/26/2016   Basal cell carcinoma    Benign neoplasm of colon    Degenerative cervical disc    Depression    Diverticulosis of colon (without mention of hemorrhage)    Esophageal reflux    Glaucoma    Osteoporosis, unspecified 12/2018   2007 T score -3.0, 2012 T score -1.9, 2015 T score -2.1 overall stable, 2018 T score -2.0, 2020 T score -1.8   Other chronic nonalcoholic liver disease    Sinusitis 12/26/2016   Stricture and stenosis of esophagus    Past Surgical History:  Procedure Laterality Date   ABDOMINAL HYSTERECTOMY  1993   TAH-LSO-Post. repair-Burch   ATRIAL FIBRILLATION ABLATION N/A 04/21/2021   Procedure: ATRIAL FIBRILLATION ABLATION;  Surgeon: Lanier Prude, MD;  Location: [facility] INVASIVE CV LAB;  Service: Cardiovascular;  Laterality: N/A;   CARDIOVERSION N/A 11/28/2020   Procedure: CARDIOVERSION;  Surgeon: Jake Bathe, MD;  Location: [facility] ENDOSCOPY;  Service: Cardiovascular;  Laterality: N/A;   CARDIOVERSION N/A 01/27/2021   Procedure: CARDIOVERSION;  Surgeon: Pricilla Riffle, MD;  Location: [facility] ENDOSCOPY;  Service: Cardiovascular;  Laterality: N/A;   COLONOSCOPY     EYE SURGERY  10/18/2012   lt eye   knee arthroscop Right 2018   left arm surgery  7-09   POLYPECTOMY     Family History  Problem Relation Age of Onset   Heart disease Mother    Cancer Father        colon   Hypertension Father    Colon cancer Father 12   Cancer Maternal Grandmother        uterine   Diabetes Paternal Grandfather    Cancer Son        Testicular cancer   Heart attack Son    Stomach cancer Neg Hx    Rectal cancer Neg Hx    Social History   Socioeconomic History   Marital status: Divorced    Spouse name: Not on file   Number of children: 3   Years of education: Not on file   Highest education level: Not on  file  Occupational History   Occupation: retired  Tobacco Use   Smoking status: Former   Smokeless tobacco: Never   Tobacco comments:    In college  Vaping Use   Vaping status: Never Used  Substance and Sexual Activity   Alcohol use: Not Currently   Drug use: No   Sexual activity: Not Currently    Birth control/protection: Surgical    Comment: 1st intercourse 72 yo-More than 5 partner,, hysterectomy  Other Topics Concern   Not on file  Social History Narrative   Not on file   Social Determinants of Health   Financial Resource Strain: Low Risk  (09/26/2021)   Overall Financial Resource Strain (CARDIA)    Difficulty of Paying  Living Expenses: Not hard at all  Food Insecurity: No Food Insecurity (10/28/2022)   Hunger Vital Sign    Worried About Running Out of Food in the Last Year: Never true    Ran Out of Food in the Last Year: Never true  Transportation Needs: No Transportation Needs (10/28/2022)   PRAPARE - Administrator, Civil Service (Medical): No    Lack of Transportation (Non-Medical): No  Physical Activity: Insufficiently Active (10/28/2022)   Exercise Vital Sign    Days of Exercise per Week: 3 days    Minutes of Exercise per Session: 40 min  Stress: No Stress Concern Present (10/28/2022)   Diana-Reeves [employer] - Occupational Stress Questionnaire    Feeling of Stress : Not at all  Social Connections: Moderately Integrated (10/28/2022)   Social Connection and Isolation Panel [NHANES]    Frequency of Communication with Friends and Family: More than three times a week    Frequency of Social Gatherings with Friends and Family: More than three times a week    Attends Religious Services: More than 4 times per year    Active Member of [employer] or Organizations: Yes    Attends Engineer, structural: More than 4 times per year    Marital Status: Divorced    Tobacco Counseling Counseling given: Not Answered Tobacco comments: In  college   Clinical Intake:  Pre-visit preparation completed: Yes  Pain : No/denies pain     BMI - recorded: 32.28 Nutritional Status: BMI > 30  Obese Nutritional Risks: None Diabetes: Yes CBG done?: No Did pt. bring in CBG monitor from home?: No  How often do you need to have someone help you when you read instructions, pamphlets, or other written materials from your doctor or pharmacy?: 1 - Never  Interpreter Needed?: No  Information entered by :: Theresa Mulligan LPN   Activities of Daily Living    10/28/2022   10:01 AM 10/28/2022   10:00 AM  In your present state of health, do you have any difficulty performing the following activities:  Hearing? 0 0  Vision? 0 0  Difficulty concentrating or making decisions? 0 0  Walking or climbing stairs? 0 0  Dressing or bathing? 0 0  Doing errands, shopping? 0 0  Preparing Food and eating ? N N  Using the Toilet? N N  In the past six months, have you accidently leaked urine? N N  Do you have problems with loss of bowel control? N N  Managing your Medications? N N  Managing your Finances? N N  Housekeeping or managing your Housekeeping? N N    Patient Care Team: Etta Grandchild, MD as PCP - General (Internal Medicine) Thurmon Fair, MD as PCP - Cardiology (Cardiology) Lanier Prude, MD as PCP - Electrophysiology (Cardiology) Maris Berger, MD as Consulting Physician (Ophthalmology)  Indicate any recent Medical Services you may have received from other than [facility] providers in the past year (date may be approximate).     Assessment:   This is a routine wellness examination for Diana Reeves.  Hearing/Vision screen Hearing Screening - Comments:: Denies hearing difficulties   Vision Screening - Comments:: Wears rx glasses - up to date with routine eye exams with  Dr Honor Loh  Dietary issues and exercise activities discussed:     Goals Addressed               This Visit's Progress     Stay Independant (pt-stated)  Depression Screen    10/28/2022   10:00 AM 05/13/2022    8:23 AM 09/26/2021    1:13 PM 09/26/2021    1:11 PM 02/20/2021    9:44 AM 10/28/2020   10:48 AM 06/12/2020    1:50 PM  PHQ 2/9 Scores  PHQ - 2 Score 0 0 0 0 0 0 0  PHQ- 9 Score  1   0 0     Fall Risk    10/28/2022   10:02 AM 10/25/2022    1:56 PM 05/13/2022    8:22 AM 09/26/2021    1:13 PM 06/12/2020    1:52 PM  Fall Risk   Falls in the past year? 1 1 1  0 1  Number falls in past yr: 0 0 0 0 0  Injury with Fall? 0 0 0 0 1  Risk for fall due to : No Fall Risks  No Fall Risks    Follow up Falls prevention discussed  Falls evaluation completed Falls evaluation completed;Education provided Falls evaluation completed    MEDICARE RISK AT HOME:  Medicare Risk at Home - 10/28/22 1011     Any stairs in or around the home? Yes    If so, are there any without handrails? No    Home free of loose throw rugs in walkways, pet beds, electrical cords, etc? Yes    Adequate lighting in your home to reduce risk of falls? Yes    Life alert? No    Use of a cane, walker or w/c? No    Grab bars in the bathroom? No    Shower chair or bench in shower? No    Elevated toilet seat or a handicapped toilet? No             TIMED UP AND GO:  Was the test performed?  No    Cognitive Function:        10/28/2022   10:03 AM  6CIT Screen  What Year? 0 points  What month? 0 points  What time? 0 points  Count back from 20 0 points  Months in reverse 0 points  Repeat phrase 0 points  Total Score 0 points    Immunizations Immunization History  Administered Date(s) Administered   Fluad Quad(high Dose 65+) 12/23/2021   Influenza, High Dose Seasonal PF 01/06/2017, 12/09/2017   Influenza,inj,Quad PF,6+ Mos 11/05/2015, 12/09/2018   Influenza-Unspecified 12/15/2014, 12/15/2019, 12/14/2020   PFIZER(Purple Top)SARS-COV-2 Vaccination 04/23/2019, 05/18/2019, 08/21/2019, 12/15/2019, 12/23/2021   Pneumococcal Conjugate-13 08/29/2014    Pneumococcal Polysaccharide-23 01/01/2011, 01/26/2018   Tdap 10/08/2010   Zoster, Live 06/20/2012    TDAP status: Due, Education has been provided regarding the importance of this vaccine. Advised may receive this vaccine at local pharmacy or Health Dept. Aware to provide a copy of the vaccination record if obtained from local pharmacy or Health Dept. Verbalized acceptance and understanding.  Flu Vaccine status: Due, Education has been provided regarding the importance of this vaccine. Advised may receive this vaccine at local pharmacy or Health Dept. Aware to provide a copy of the vaccination record if obtained from local pharmacy or Health Dept. Verbalized acceptance and understanding.  Pneumococcal vaccine status: Up to date  Covid-19 vaccine status: Declined, Education has been provided regarding the importance of this vaccine but patient still declined. Advised may receive this vaccine at local pharmacy or Health Dept.or vaccine clinic. Aware to provide a copy of the vaccination record if obtained from local pharmacy or Health Dept. Verbalized acceptance and understanding.  Qualifies for Shingles Vaccine? Yes   Zostavax completed No   Shingrix Completed?: No.    Education has been provided regarding the importance of this vaccine. Patient has been advised to call insurance company to determine out of pocket expense if they have not yet received this vaccine. Advised may also receive vaccine at local pharmacy or Health Dept. Verbalized acceptance and understanding.  Screening Tests Health Maintenance  Topic Date Due   Zoster Vaccines- Shingrix (1 of 2) 06/10/1969   DTaP/Tdap/Td (2 - Td or Tdap) 10/07/2020   COVID-19 Vaccine (6 - 2023-24 season) 02/17/2022   OPHTHALMOLOGY EXAM  07/16/2022   Colonoscopy  08/26/2022   INFLUENZA VACCINE  10/15/2022   HEMOGLOBIN A1C  11/11/2022   MAMMOGRAM  12/03/2022   Diabetic kidney evaluation - Urine ACR  05/14/2023   FOOT EXAM  05/14/2023   Diabetic  kidney evaluation - eGFR measurement  10/27/2023   Medicare Annual Wellness (AWV)  10/28/2023   Pneumonia Vaccine 31+ Years old  Completed   DEXA SCAN  Completed   Hepatitis C Screening  Completed   HPV VACCINES  Aged Out    Health Maintenance  Health Maintenance Due  Topic Date Due   Zoster Vaccines- Shingrix (1 of 2) 06/10/1969   DTaP/Tdap/Td (2 - Td or Tdap) 10/07/2020   COVID-19 Vaccine (6 - 2023-24 season) 02/17/2022   OPHTHALMOLOGY EXAM  07/16/2022   Colonoscopy  08/26/2022   INFLUENZA VACCINE  10/15/2022    Colorectal cancer screening: Type of screening: Colonoscopy. Completed 6/. Repeat every   years  Mammogram status: Completed 12/02/21. Repeat every year  Bone Density status: Completed 03/04/21. Results reflect: Bone density results: OSTEOPOROSIS. Repeat every   years.  Lung Cancer Screening: (Low Dose CT Chest recommended if Age 6-80 years, 20 pack-year currently smoking OR have quit w/in 15years.) does not qualify.     Additional Screening:  Hepatitis C Screening: does qualify; Completed 01/30/19  Vision Screening: Recommended annual ophthalmology exams for early detection of glaucoma and other disorders of the eye. Is the patient up to date with their annual eye exam?  Yes  Who is the provider or what is the name of the office in which the patient attends annual eye exams? Dr Honor Loh If pt is not established with a provider, would they like to be referred to a provider to establish care? No .   Dental Screening: Recommended annual dental exams for proper oral hygiene  Diabetic Foot Exam: Diabetic Foot Exam: Completed 05/13/22  Community Resource Referral / Chronic Care Management:  CRR required this visit?  No   CCM required this visit?  No     Plan:     I have personally reviewed and noted the following in the patient's chart:   Medical and social history Use of alcohol, tobacco or illicit drugs  Current medications and supplements including  opioid prescriptions. Patient is not currently taking opioid prescriptions. Functional ability and status Nutritional status Physical activity Advanced directives List of other physicians Hospitalizations, surgeries, and ER visits in previous 12 months Vitals Screenings to include cognitive, depression, and falls Referrals and appointments  In addition, I have reviewed and discussed with patient certain preventive protocols, quality metrics, and best practice recommendations. A written personalized care plan for preventive services as well as general preventive health recommendations were provided to patient.     Tillie Rung, LPN   06/22/8117   After Visit Summary: (MyChart) Due to this being a telephonic visit, the after visit  summary with patients personalized plan was offered to patient via MyChart   Nurse Notes: None

## 2022-10-30 DIAGNOSIS — J3089 Other allergic rhinitis: Secondary | ICD-10-CM | POA: Diagnosis not present

## 2022-11-02 ENCOUNTER — Other Ambulatory Visit: Payer: Self-pay

## 2022-11-02 DIAGNOSIS — I4891 Unspecified atrial fibrillation: Secondary | ICD-10-CM

## 2022-11-02 MED ORDER — RIVAROXABAN 20 MG PO TABS
20.0000 mg | ORAL_TABLET | Freq: Every day | ORAL | 1 refills | Status: AC
Start: 2022-11-02 — End: ?

## 2022-11-02 NOTE — Telephone Encounter (Signed)
Prescription refill request for Xarelto received.  Indication:afib Last office visit:8/24 Weight:88  kg Age:72 Scr:0.79  8/24 CrCl:86.79  ml/min  Prescription refilled

## 2022-11-03 ENCOUNTER — Other Ambulatory Visit: Payer: Self-pay | Admitting: Internal Medicine

## 2022-11-03 DIAGNOSIS — F418 Other specified anxiety disorders: Secondary | ICD-10-CM

## 2022-11-04 DIAGNOSIS — J3081 Allergic rhinitis due to animal (cat) (dog) hair and dander: Secondary | ICD-10-CM | POA: Diagnosis not present

## 2022-11-04 DIAGNOSIS — J3089 Other allergic rhinitis: Secondary | ICD-10-CM | POA: Diagnosis not present

## 2022-11-04 DIAGNOSIS — J301 Allergic rhinitis due to pollen: Secondary | ICD-10-CM | POA: Diagnosis not present

## 2022-11-09 DIAGNOSIS — J3089 Other allergic rhinitis: Secondary | ICD-10-CM | POA: Diagnosis not present

## 2022-11-09 DIAGNOSIS — J301 Allergic rhinitis due to pollen: Secondary | ICD-10-CM | POA: Diagnosis not present

## 2022-11-09 DIAGNOSIS — J3081 Allergic rhinitis due to animal (cat) (dog) hair and dander: Secondary | ICD-10-CM | POA: Diagnosis not present

## 2022-11-19 ENCOUNTER — Other Ambulatory Visit: Payer: Self-pay | Admitting: Internal Medicine

## 2022-11-19 DIAGNOSIS — K2100 Gastro-esophageal reflux disease with esophagitis, without bleeding: Secondary | ICD-10-CM

## 2022-11-19 DIAGNOSIS — J3089 Other allergic rhinitis: Secondary | ICD-10-CM | POA: Diagnosis not present

## 2022-11-19 DIAGNOSIS — E785 Hyperlipidemia, unspecified: Secondary | ICD-10-CM

## 2022-11-20 ENCOUNTER — Other Ambulatory Visit: Payer: Self-pay | Admitting: Internal Medicine

## 2022-11-20 DIAGNOSIS — K2100 Gastro-esophageal reflux disease with esophagitis, without bleeding: Secondary | ICD-10-CM

## 2022-11-24 DIAGNOSIS — M545 Low back pain, unspecified: Secondary | ICD-10-CM | POA: Diagnosis not present

## 2022-11-24 DIAGNOSIS — M7061 Trochanteric bursitis, right hip: Secondary | ICD-10-CM | POA: Diagnosis not present

## 2022-11-29 ENCOUNTER — Other Ambulatory Visit: Payer: Self-pay | Admitting: Internal Medicine

## 2022-11-29 DIAGNOSIS — F418 Other specified anxiety disorders: Secondary | ICD-10-CM

## 2022-11-30 DIAGNOSIS — J301 Allergic rhinitis due to pollen: Secondary | ICD-10-CM | POA: Diagnosis not present

## 2022-11-30 DIAGNOSIS — J3081 Allergic rhinitis due to animal (cat) (dog) hair and dander: Secondary | ICD-10-CM | POA: Diagnosis not present

## 2022-11-30 DIAGNOSIS — J3089 Other allergic rhinitis: Secondary | ICD-10-CM | POA: Diagnosis not present

## 2022-11-30 DIAGNOSIS — Z1231 Encounter for screening mammogram for malignant neoplasm of breast: Secondary | ICD-10-CM | POA: Diagnosis not present

## 2022-11-30 LAB — HM MAMMOGRAPHY

## 2022-12-01 ENCOUNTER — Encounter: Payer: Self-pay | Admitting: Internal Medicine

## 2022-12-04 ENCOUNTER — Other Ambulatory Visit: Payer: Self-pay | Admitting: Cardiology

## 2022-12-17 ENCOUNTER — Other Ambulatory Visit: Payer: Self-pay | Admitting: Internal Medicine

## 2022-12-17 DIAGNOSIS — K2100 Gastro-esophageal reflux disease with esophagitis, without bleeding: Secondary | ICD-10-CM

## 2022-12-24 ENCOUNTER — Other Ambulatory Visit: Payer: Medicare PPO

## 2022-12-24 ENCOUNTER — Ambulatory Visit: Payer: Medicare PPO | Admitting: Nurse Practitioner

## 2022-12-24 ENCOUNTER — Encounter: Payer: Self-pay | Admitting: Nurse Practitioner

## 2022-12-24 VITALS — BP 124/70 | HR 73 | Ht 65.0 in | Wt 194.0 lb

## 2022-12-24 DIAGNOSIS — J3089 Other allergic rhinitis: Secondary | ICD-10-CM | POA: Diagnosis not present

## 2022-12-24 DIAGNOSIS — J3081 Allergic rhinitis due to animal (cat) (dog) hair and dander: Secondary | ICD-10-CM | POA: Diagnosis not present

## 2022-12-24 DIAGNOSIS — R1011 Right upper quadrant pain: Secondary | ICD-10-CM

## 2022-12-24 DIAGNOSIS — J301 Allergic rhinitis due to pollen: Secondary | ICD-10-CM | POA: Diagnosis not present

## 2022-12-24 LAB — COMPREHENSIVE METABOLIC PANEL
ALT: 17 U/L (ref 0–35)
AST: 19 U/L (ref 0–37)
Albumin: 4 g/dL (ref 3.5–5.2)
Alkaline Phosphatase: 86 U/L (ref 39–117)
BUN: 15 mg/dL (ref 6–23)
CO2: 26 meq/L (ref 19–32)
Calcium: 9.2 mg/dL (ref 8.4–10.5)
Chloride: 105 meq/L (ref 96–112)
Creatinine, Ser: 0.59 mg/dL (ref 0.40–1.20)
GFR: 88.71 mL/min (ref >60.00)
Glucose, Bld: 151 mg/dL — ABNORMAL HIGH (ref 70–99)
Potassium: 3.4 meq/L — ABNORMAL LOW (ref 3.5–5.1)
Sodium: 139 meq/L (ref 135–145)
Total Bilirubin: 0.5 mg/dL (ref 0.2–1.2)
Total Protein: 6.8 g/dL (ref 6.0–8.3)

## 2022-12-24 LAB — CBC WITH DIFFERENTIAL/PLATELET
Basophils Absolute: 0 10*3/uL (ref 0.0–0.1)
Basophils Relative: 0.5 % (ref 0.0–3.0)
Eosinophils Absolute: 0.3 10*3/uL (ref 0.0–0.7)
Eosinophils Relative: 3.4 % (ref 0.0–5.0)
HCT: 38.9 % (ref 36.0–46.0)
Hemoglobin: 12.8 g/dL (ref 12.0–15.0)
Lymphocytes Relative: 26.7 % (ref 12.0–46.0)
Lymphs Abs: 2 10*3/uL (ref 0.7–4.0)
MCHC: 32.8 g/dL (ref 30.0–36.0)
MCV: 80.2 fL (ref 78.0–100.0)
Monocytes Absolute: 0.6 10*3/uL (ref 0.1–1.0)
Monocytes Relative: 8 % (ref 3.0–12.0)
Neutro Abs: 4.7 10*3/uL (ref 1.4–7.7)
Neutrophils Relative %: 61.4 % (ref 43.0–77.0)
Platelets: 302 10*3/uL (ref 150.0–400.0)
RBC: 4.85 Mil/uL (ref 3.87–5.11)
RDW: 17.5 % — ABNORMAL HIGH (ref 11.5–15.5)
WBC: 7.6 10*3/uL (ref 4.0–10.5)

## 2022-12-24 NOTE — Progress Notes (Signed)
12/24/2022 OMOLOLA HUCKINS [id number] 01-Jun-1950   Chief Complaint: RUQ pain  History of Present Illness: Diana Reeves. Hilgendorf is a 72 year old female with a past medical history of pression, glaucoma, atrial fibrillation on Xarelto, hepatic steatosis, gallbladder polyp, GERD and colon polyps. She presents today for further evaluation regarding right abdominal pain. She endorses having right mid abdominal pain which started 3 days ago with associated fatigue and change in bowel pattern. She stated on Monday 12/21/2022, she felt lethargic and could not get out of bed. She also noted having a slight headache at that time and he started passing several soft to mud like yellow to greenish stools. No rectal bleeding or black stools.  She noted having right mid abdominal pain which was triggered by change of position with a decreased appetite.  Eating does not trigger this pain. No nausea or vomiting. Over the past 2 days, she is feeling better. Her energy level has improved and her right mid abdominal pain has decreased. No BM today. She was taking Ibuprofen 200 mg 4 tabs twice daily for 3 weeks due to having bilateral hip pain which she stopped taking a few days ago.  She has a history of the gallbladder polyp and questioned if she had gallstones. HIDA scan 09/2021 showed mild gallbladder dyskinesia with ejection fraction 35%. EGD 08/2021 showed a 1 cm hiatal hernia, gastritis and multiple gastric polyps. She underwent a colonoscopy on the same date which identified ten 4 to 11 mm tubular adenomatous polyps removed from the descending, transverse and ascending colon and diverticulosis throughout the colon was noted with evidence of an impacted diverticulum.  She denies having any dysphagia or heartburn. She takes Esomeprazole 40 mg daily.     Latest Ref Rng & Units 10/27/2022   11:13 AM 05/13/2022    8:54 AM 06/19/2021   11:06 AM  CBC  WBC 3.4 - 10.8 x10E3/uL 8.3  5.1  7.0   Hemoglobin 11.1 - 15.9 g/dL 13.0   86.5  78.4   Hematocrit 34.0 - 46.6 % 38.6  39.7  40.1   Platelets 150 - 450 x10E3/uL 223  210.0  289.0        Latest Ref Rng & Units 10/27/2022   11:13 AM 05/13/2022    8:54 AM 06/19/2021   11:06 AM  CMP  Glucose 70 - 99 mg/dL 696  295  284   BUN 8 - 27 mg/dL 16  13  11    Creatinine 0.57 - 1.00 mg/dL 1.32  4.40  1.02   Sodium 134 - 144 mmol/L 139  137  139   Potassium 3.5 - 5.2 mmol/L 4.0  3.6  3.5   Chloride 96 - 106 mmol/L 102  101  104   CO2 20 - 29 mmol/L 23  28  25    Calcium 8.7 - 10.3 mg/dL 9.7  9.2  9.4   Total Protein 6.0 - 8.3 g/dL  7.1  7.0   Total Bilirubin 0.2 - 1.2 mg/dL  0.4  0.5   Alkaline Phos 39 - 117 U/L  76  66   AST 0 - 37 U/L  33  29   ALT 0 - 35 U/L  21  21     RUQ sonogram 07/05/2021: 1. Similar-appearing 4-5 mm gallbladder polyp. Given the possible mild interval change appearance, follow-up right upper quadrant ultrasound is recommended in 12 months. 2. Mildly increased hepatic parenchymal echogenicity suggestive of steatosis.  HIDA scan October 10, 2021  1.  Low normal gallbladder ejection fraction.  35% 2.  Patent cystic and common bile ducts.  PAST GI PROCEDURES:   EGD 08/25/2021 - Z-line regular, 36 cm from the incisors. - No gross lesions in esophagus. - 1 cm hiatal hernia. - Gastritis. Biopsied. - Multiple gastric polyps. Biopsied. - Normal examined duodenum.   Colonoscopy 08/25/2021 - Ten 4 to 11 mm polyps in the descending colon, in the transverse colon and in the ascending colon, removed with a cold snare. Resected and retrieved. - Moderate diverticulosis in the sigmoid colon, in the descending colon, in the transverse colon, in the ascending colon and in the cecum. There was evidence of an impacted diverticulum. - Non-bleeding external and internal hemorrhoids.  Past Medical History:  Diagnosis Date   A-fib [facility])    Allergic state 12/26/2016   Basal cell carcinoma    Benign neoplasm of colon    Degenerative cervical disc    Depression     Diverticulosis of colon (without mention of hemorrhage)    Esophageal reflux    Glaucoma    Osteoporosis, unspecified 12/2018   2007 T score -3.0, 2012 T score -1.9, 2015 T score -2.1 overall stable, 2018 T score -2.0, 2020 T score -1.8   Other chronic nonalcoholic liver disease    Sinusitis 12/26/2016   Stricture and stenosis of esophagus    Past Surgical History:  Procedure Laterality Date   ABDOMINAL HYSTERECTOMY  1993   TAH-LSO-Post. repair-Burch   ATRIAL FIBRILLATION ABLATION N/A 04/21/2021   Procedure: ATRIAL FIBRILLATION ABLATION;  Surgeon: Lanier Prude, MD;  Location: [facility] INVASIVE CV LAB;  Service: Cardiovascular;  Laterality: N/A;   CARDIOVERSION N/A 11/28/2020   Procedure: CARDIOVERSION;  Surgeon: Jake Bathe, MD;  Location: [facility] ENDOSCOPY;  Service: Cardiovascular;  Laterality: N/A;   CARDIOVERSION N/A 01/27/2021   Procedure: CARDIOVERSION;  Surgeon: Pricilla Riffle, MD;  Location: [facility] ENDOSCOPY;  Service: Cardiovascular;  Laterality: N/A;   COLONOSCOPY     EYE SURGERY  10/18/2012   lt eye   knee arthroscop Right 2018   left arm surgery  7-09   POLYPECTOMY     Current Outpatient Medications on File Prior to Visit  Medication Sig Dispense Refill   Azelastine-Fluticasone 137-50 MCG/ACT SUSP Place 1 spray into both nostrils daily as needed (allergies).     Blood Glucose Calibration (OT ULTRA/FASTTK CNTRL SOLN) SOLN 1 Act by In Vitro route 2 (two) times daily. 1 each 2   Blood Glucose Monitoring Suppl (ONE TOUCH ULTRA 2) w/Device KIT 1 Act by Does not apply route 2 (two) times daily. 1 kit 1   Cholecalciferol 50 MCG (2000 UT) TABS Take 1 tablet (2,000 Units total) by mouth daily. 90 tablet 3   clonazePAM (KLONOPIN) 0.5 MG tablet Take 1 tablet (0.5 mg total) by mouth 2 (two) times daily as needed for anxiety. 60 tablet 3   diltiazem (CARDIZEM CD) 180 MG 24 hr capsule Take 1 capsule (180 mg total) by mouth daily. 90 capsule 3   dorzolamide-timolol (COSOPT) 22.3-6.8 MG/ML  ophthalmic solution Place 1 drop into the right eye 2 (two) times daily.     DULoxetine (CYMBALTA) 60 MG capsule TAKE 1 CAPSULE BY MOUTH DAILY *MUST RETURN TO MD IN 6 MONTHS FOR FUTURE REFILLS 30 capsule 0   EPINEPHrine 0.3 mg/0.3 mL IJ SOAJ injection Inject 0.3 mg into the muscle as needed for anaphylaxis.     esomeprazole (NEXIUM) 40 MG capsule TAKE ONE CAPSULE BY MOUTH EVERY MORNING BEFORE  BREAKFAST 90 capsule 1   glucose blood (ONETOUCH ULTRA) test strip Use as instructed 100 each 5   latanoprost (XALATAN) 0.005 % ophthalmic solution Apply to eye.     NONFORMULARY OR COMPOUNDED ITEM Estradiol vaginal cream 0.02% insert 1 gram vaginally twice weekly.  Dispense 24 grams/0RF 1 each 0   empagliflozin (JARDIANCE) 10 MG TABS tablet Take 1 tablet (10 mg total) by mouth daily. (Patient not taking: Reported on 12/24/2022) 90 tablet 0   rivaroxaban (XARELTO) 20 MG TABS tablet Take 1 tablet (20 mg total) by mouth daily with supper. (Patient not taking: Reported on 12/24/2022) 90 tablet 1   rosuvastatin (CRESTOR) 10 MG tablet Take 1 tablet (10 mg total) by mouth daily. (Patient not taking: Reported on 12/24/2022) 90 tablet 1   No current facility-administered medications on file prior to visit.   Allergies  Allergen Reactions   Dust Mite Extract     Asthma like symptoms    Mold Extract [Trichophyton Mentagrophyte]     Asthma like symptoms    Current Medications, Allergies, Past Medical History, Past Surgical History, Family History and Social History were reviewed in [employer] record.  Review of Systems:   Constitutional: Negative for fever, sweats, chills or weight loss.  Respiratory: Negative for shortness of breath.   Cardiovascular: Negative for chest pain, palpitations and leg swelling.  Gastrointestinal: See HPI.  Musculoskeletal: Negative for back pain or muscle aches.  Neurological: Negative for dizziness, headaches or paresthesias.    Physical Exam: Ht 5\' 5"   (1.651 m)   Wt 194 lb (88 kg)   LMP 03/17/1991   BMI 32.28 kg/m  General: 72 year old female in no acute distress. Head: Normocephalic and atraumatic. Eyes: No scleral icterus. Conjunctiva pink . Ears: Normal auditory acuity. Mouth: Dentition intact. No ulcers or lesions.  Lungs: Clear throughout to auscultation. Heart: Regular rate and rhythm, no murmur. Abdomen: Soft, nondistended. Mild localized tenderness to the right mid abdomen without rebound or guarding.  No masses or hepatomegaly. Normal bowel sounds x 4 quadrants.  No bruit. Rectal: Deferred.  Musculoskeletal: Symmetrical with no gross deformities. Extremities: No edema. Neurological: Alert oriented x 4. No focal deficits.  Psychological: Alert and cooperative. Normal mood and affect  Assessment and Recommendations:  72 year old female with a history of GERD presents with right upper quadrant/right mid abdominal pain x 3 days which is positional and has improved over the past 2 days. -CBC and CMP today -CTAP with oral and IV contrast if right upper quadrant/right mid abdominal pain persists or worsens, rule out diverticulitis with known diverticulosis to the cecum and ascending colon -Push fluids -Bland diet -Patient instructed go to the emergency room if she develops severe abdominal pain -Avoid NSAIDs/ibuprofen -Continue Esomeprazole 40 mg daily and add Famotidine 20 mg p.o. daily  History of a less than 1 cm gallbladder polyp RUQ sonogram 06/2021  Change in bowel pattern, soft to loose yellow-green her stools x 3 days, no BM yet today -Patient to monitor bowel pattern for now  History of diverticulosis to the cecum, ascending colon, transverse, descending and sigmoid colon  History of colon polyps. Colonoscopy 08/2021 identified ten 4 to 11 mm tubular adenomatous polyps removed from the descending, transverse and ascending colon.  Repeat colonoscopy in 1 year was recommended. -Patient is past due for colon polyp  surveillance colonoscopy.  Await the above lab results and follow-up with the patient in a few days to verify if her abdominal pain has abated. If she continues  to have right sided abdominal pain, she will require CTAP to rule out diverticulitis or other intra-abdominal pathology to explain her pain prior to proceeding with a colonoscopy.  Atrial fibrillation on Xarelto

## 2022-12-24 NOTE — Patient Instructions (Addendum)
Your provider has requested that you go to the basement level for lab work before leaving today. Press "B" on the elevator. The lab is located at the first door on the left as you exit the elevator.  Take Pepcid 20 mg daily (over the counter)  Push fluids.  Continue bland diet.  Go to ED if severe abdominal pain occurs.  Avoid Ibuprofen.  Contact our office if symptoms worsens.  Due to recent changes in healthcare laws, you may see the results of your imaging and laboratory studies on MyChart before your provider has had a chance to review them.  We understand that in some cases there may be results that are confusing or concerning to you. Not all laboratory results come back in the same time frame and the provider may be waiting for multiple results in order to interpret others.  Please give [place] 48 hours in order for your provider to thoroughly review all the results before contacting the office for clarification of your results.   Thank you for trusting me with your gastrointestinal care!   Alcide Evener, CRNP

## 2022-12-27 ENCOUNTER — Other Ambulatory Visit: Payer: Self-pay | Admitting: Internal Medicine

## 2022-12-27 DIAGNOSIS — F418 Other specified anxiety disorders: Secondary | ICD-10-CM

## 2022-12-29 ENCOUNTER — Encounter: Payer: Self-pay | Admitting: Nurse Practitioner

## 2022-12-29 ENCOUNTER — Ambulatory Visit (INDEPENDENT_AMBULATORY_CARE_PROVIDER_SITE_OTHER): Payer: Medicare PPO | Admitting: Nurse Practitioner

## 2022-12-29 VITALS — BP 128/82 | HR 90 | Resp 12 | Ht 64.5 in | Wt 195.0 lb

## 2022-12-29 DIAGNOSIS — R3915 Urgency of urination: Secondary | ICD-10-CM | POA: Diagnosis not present

## 2022-12-29 DIAGNOSIS — Z9189 Other specified personal risk factors, not elsewhere classified: Secondary | ICD-10-CM | POA: Diagnosis not present

## 2022-12-29 DIAGNOSIS — M810 Age-related osteoporosis without current pathological fracture: Secondary | ICD-10-CM

## 2022-12-29 DIAGNOSIS — Z780 Asymptomatic menopausal state: Secondary | ICD-10-CM

## 2022-12-29 DIAGNOSIS — N9089 Other specified noninflammatory disorders of vulva and perineum: Secondary | ICD-10-CM | POA: Diagnosis not present

## 2022-12-29 DIAGNOSIS — N952 Postmenopausal atrophic vaginitis: Secondary | ICD-10-CM | POA: Diagnosis not present

## 2022-12-29 DIAGNOSIS — J3089 Other allergic rhinitis: Secondary | ICD-10-CM | POA: Diagnosis not present

## 2022-12-29 DIAGNOSIS — J3081 Allergic rhinitis due to animal (cat) (dog) hair and dander: Secondary | ICD-10-CM | POA: Diagnosis not present

## 2022-12-29 DIAGNOSIS — Z01419 Encounter for gynecological examination (general) (routine) without abnormal findings: Secondary | ICD-10-CM

## 2022-12-29 DIAGNOSIS — J301 Allergic rhinitis due to pollen: Secondary | ICD-10-CM | POA: Diagnosis not present

## 2022-12-29 MED ORDER — NONFORMULARY OR COMPOUNDED ITEM
3 refills | Status: DC
Start: 2022-12-29 — End: 2023-06-16

## 2022-12-29 NOTE — Progress Notes (Signed)
Diana Reeves 74-Nov-1952 [id number]   History:  72 y.o. G3P3003 presents for breast and pelvic exam. Complains of sores on left inner labia x 2 days. Does not recall any injury. No known history of HSV. Also having urinary urgency. This is not new. Never has incontinence. It only occurs at home after she has been lying down and goes to sitting or standing position. Postmenopausal - no systemic HRT. Using compounded vaginal estrogen. S/P 1993 TAH LSO with posterior repair. A fib followed by cardiology. Osteoporosis, was on Actonel years ago. Taking Vit D supplement, does water aerobics.  H/O HTN, T2DM, HLD.   Gynecologic History Patient's last menstrual period was 03/17/1991.   Contraception: post menopausal status Sexually active: No  Health Maintenance Last Pap: 10/20/2013  Last mammogram: 11/30/2022. Results were: Normal Last colonoscopy: 08/25/2021. Results were: 9 tubular adenoma, 1-year recall Last Dexa: 03/04/2021. Results were: T-score -2.3  Past medical history, past surgical history, family history and social history were all reviewed and documented in the EPIC chart. Retired Systems developer. Still subs. Father diagnosed with colon cancer at age 49.   ROS:  A ROS was performed and pertinent positives and negatives are included.  Exam:  Vitals:   12/29/22 1012  BP: 128/82  Pulse: 90  Resp: 12  Weight: 195 lb (88.5 kg)  Height: 5' 4.5" (1.638 m)   Body mass index is 32.95 kg/m. Physical Exam Constitutional:      Appearance: Normal appearance.  Neck:     Thyroid: No thyroid mass, thyromegaly or thyroid tenderness.  Cardiovascular:     Rate and Rhythm: Normal rate and regular rhythm.  Pulmonary:     Effort: Pulmonary effort is normal.     Breath sounds: Normal breath sounds.  Chest:  Breasts:    Right: Normal.     Left: Normal.  Abdominal:     Palpations: Abdomen is soft.     Tenderness: There is no abdominal tenderness.  Genitourinary:    Labia:        Left:  Lesion present.      Vagina: Normal.     Uterus: Absent.      Adnexa: Right adnexa normal.     Rectum: Normal.       Comments: Atrophic changes Grade 2 rectal prolapse Lymphadenopathy:     Upper Body:     Right upper body: No supraclavicular or axillary adenopathy.     Left upper body: No supraclavicular or axillary adenopathy.     Patient informed chaperone available to be present for breast and pelvic exam. Patient has requested no chaperone to be present. Patient has been advised what will be completed during breast and pelvic exam.   Assessment/Plan:  72 y.o. G3P3003 for breast and pelvic exam.   Encounter for breast and pelvic examination - Education provided on SBEs, importance of preventative screenings, current guidelines, high calcium diet, regular exercise, and multivitamin daily. Labs done elsewhere.   Postmenopausal - S/P 1993 TAH LSO with posterior repair. No systemic HRT.   Age-related osteoporosis without current pathological fracture - Plan: DG Bone Density. 02/2021 T-score -2.3. Was on Actonel previously. Taking Vit D supplement and doing water aerobics.   Vulvar lesion - Plan: SureSwab HSV, Type 1/2 DNA, PCR  Vaginal atrophy - Plan: NONFORMULARY OR COMPOUNDED ITEM. Using compounded vaginal estrogen cream 0.2% 1 gram twice weekly. Refills provided.   Urgency of urination - This is not new. Never has incontinence. It only occurs at home after she  has been lying prone and goes to sitting or standing position. No evidence of prolapse. Discussed lifestyle changes and option for pelvic floor PT.  Screening for cervical cancer - Normal Pap history.  No longer screening per guidelines.   Screening for breast cancer - Normal mammogram history.  Continue annual screenings.  Normal breast exam today.  Screening for colon cancer - 08/2021 colonoscopy. Will repeat at GI's recommended interval.    Return in about 1 year (around 12/29/2023) for B&P, High risk.    [name] DNP, 12:27 PM 12/29/2022

## 2022-12-30 LAB — SURESWAB HSV, TYPE 1/2 DNA, PCR
HSV 1 DNA: NOT DETECTED
HSV 2 DNA: DETECTED — AB

## 2022-12-31 ENCOUNTER — Telehealth: Payer: Self-pay

## 2022-12-31 ENCOUNTER — Other Ambulatory Visit: Payer: Self-pay

## 2022-12-31 DIAGNOSIS — Z9189 Other specified personal risk factors, not elsewhere classified: Secondary | ICD-10-CM

## 2022-12-31 DIAGNOSIS — Z860100 Personal history of colon polyps, unspecified: Secondary | ICD-10-CM

## 2022-12-31 MED ORDER — CLENPIQ 10-3.5-12 MG-GM -GM/160ML PO SOLN: 1.0000 | ORAL | 0 refills | Status: DC

## 2022-12-31 NOTE — Telephone Encounter (Signed)
Patient Name: Diana Reeves  DOB: 08/26/1950 MRN: [id number]  Primary Cardiologist: Thurmon Fair, MD  Chart reviewed as part of pre-operative protocol coverage. Pre-op clearance already addressed by colleagues in earlier phone notes. To summarize recommendations:  -Per office protocol, patient can hold Xarelto for 1-2 days prior to procedure.  Please resume when medically safe to do so.  No medical clearance was requested.  Will route this bundled recommendation to requesting provider via Epic fax function and remove from pre-op pool. Please call with questions.  Sharlene Dory, PA-C 12/31/2022, 11:38 AM

## 2022-12-31 NOTE — Telephone Encounter (Signed)
[facility] Pre-operative Risk Assessment     Request for surgical clearance:     Endoscopy Procedure  What type of surgery is being performed?     Colonoscopy  When is this surgery scheduled?     02/25/23  What type of clearance is required ?   Pharmacy  Are there any medications that need to be held prior to surgery and how long? Xarelto   Practice name and name of physician performing surgery?      [facility]; Dr. Lavon Paganini  What is your office phone and fax number?      Phone- [phone]  Fax- [phone]  Anesthesia type (None, local, MAC, general) ?       MAC

## 2022-12-31 NOTE — Telephone Encounter (Signed)
Patient with diagnosis of afib on Xarelto for anticoagulation.    Procedure: colonoscopy Date of procedure: 02/25/23  CHA2DS2-VASc Score = 4  This indicates a 4.8% annual risk of stroke. The patient's score is based upon: CHF History: 0 HTN History: 1 Diabetes History: 1 Stroke History: 0 Vascular Disease History: 0 Age Score: 1 Gender Score: 1   CrCl 30mL/min using adjusted body weight Platelet count 302K  Per office protocol, patient can hold Xarelto for 1-2 days prior to procedure.    **This guidance is not considered finalized until pre-operative APP has relayed final recommendations.**

## 2023-01-05 DIAGNOSIS — J301 Allergic rhinitis due to pollen: Secondary | ICD-10-CM | POA: Diagnosis not present

## 2023-01-05 DIAGNOSIS — J3089 Other allergic rhinitis: Secondary | ICD-10-CM | POA: Diagnosis not present

## 2023-01-05 DIAGNOSIS — J3081 Allergic rhinitis due to animal (cat) (dog) hair and dander: Secondary | ICD-10-CM | POA: Diagnosis not present

## 2023-01-08 DIAGNOSIS — H401412 Capsular glaucoma with pseudoexfoliation of lens, right eye, moderate stage: Secondary | ICD-10-CM | POA: Diagnosis not present

## 2023-01-08 DIAGNOSIS — H401423 Capsular glaucoma with pseudoexfoliation of lens, left eye, severe stage: Secondary | ICD-10-CM | POA: Diagnosis not present

## 2023-01-08 DIAGNOSIS — E119 Type 2 diabetes mellitus without complications: Secondary | ICD-10-CM | POA: Diagnosis not present

## 2023-01-08 DIAGNOSIS — H52203 Unspecified astigmatism, bilateral: Secondary | ICD-10-CM | POA: Diagnosis not present

## 2023-01-08 LAB — HM DIABETES EYE EXAM

## 2023-01-11 ENCOUNTER — Encounter: Payer: Self-pay | Admitting: Internal Medicine

## 2023-01-11 DIAGNOSIS — J301 Allergic rhinitis due to pollen: Secondary | ICD-10-CM | POA: Diagnosis not present

## 2023-01-11 DIAGNOSIS — J3081 Allergic rhinitis due to animal (cat) (dog) hair and dander: Secondary | ICD-10-CM | POA: Diagnosis not present

## 2023-01-11 DIAGNOSIS — J3089 Other allergic rhinitis: Secondary | ICD-10-CM | POA: Diagnosis not present

## 2023-01-13 ENCOUNTER — Other Ambulatory Visit: Payer: Self-pay | Admitting: Internal Medicine

## 2023-01-13 ENCOUNTER — Encounter: Payer: Self-pay | Admitting: Internal Medicine

## 2023-01-13 DIAGNOSIS — F418 Other specified anxiety disorders: Secondary | ICD-10-CM

## 2023-01-19 DIAGNOSIS — J3089 Other allergic rhinitis: Secondary | ICD-10-CM | POA: Diagnosis not present

## 2023-01-21 ENCOUNTER — Encounter: Payer: Self-pay | Admitting: Internal Medicine

## 2023-01-21 ENCOUNTER — Ambulatory Visit: Payer: Medicare PPO | Admitting: Internal Medicine

## 2023-01-21 VITALS — BP 138/78 | HR 70 | Temp 97.8°F | Resp 16 | Ht 64.5 in | Wt 194.6 lb

## 2023-01-21 DIAGNOSIS — I480 Paroxysmal atrial fibrillation: Secondary | ICD-10-CM | POA: Diagnosis not present

## 2023-01-21 DIAGNOSIS — E118 Type 2 diabetes mellitus with unspecified complications: Secondary | ICD-10-CM

## 2023-01-21 DIAGNOSIS — Z794 Long term (current) use of insulin: Secondary | ICD-10-CM | POA: Diagnosis not present

## 2023-01-21 DIAGNOSIS — F325 Major depressive disorder, single episode, in full remission: Secondary | ICD-10-CM

## 2023-01-21 DIAGNOSIS — I10 Essential (primary) hypertension: Secondary | ICD-10-CM

## 2023-01-21 DIAGNOSIS — K2100 Gastro-esophageal reflux disease with esophagitis, without bleeding: Secondary | ICD-10-CM | POA: Diagnosis not present

## 2023-01-21 DIAGNOSIS — E785 Hyperlipidemia, unspecified: Secondary | ICD-10-CM | POA: Diagnosis not present

## 2023-01-21 DIAGNOSIS — Z23 Encounter for immunization: Secondary | ICD-10-CM | POA: Insufficient documentation

## 2023-01-21 LAB — MICROALBUMIN / CREATININE URINE RATIO
Creatinine,U: 240.8 mg/dL
Microalb Creat Ratio: 0.5 mg/g (ref 0.0–30.0)
Microalb, Ur: 1.3 mg/dL (ref 0.0–1.9)

## 2023-01-21 LAB — URINALYSIS, ROUTINE W REFLEX MICROSCOPIC
Hgb urine dipstick: NEGATIVE
Leukocytes,Ua: NEGATIVE
Nitrite: NEGATIVE
Specific Gravity, Urine: >=1.03 — AB (ref 1.000–1.030)
Total Protein, Urine: NEGATIVE
Urine Glucose: NEGATIVE
Urobilinogen, UA: 0.2 (ref 0.0–1.0)
pH: 5 (ref 5.0–8.0)

## 2023-01-21 LAB — HEMOGLOBIN A1C: Hgb A1c MFr Bld: 7.7 % — ABNORMAL HIGH (ref 4.6–6.5)

## 2023-01-21 MED ORDER — DULOXETINE HCL 60 MG PO CPEP
60.0000 mg | ORAL_CAPSULE | Freq: Every day | ORAL | 1 refills | Status: DC
Start: 2023-01-21 — End: 2023-06-16

## 2023-01-21 MED ORDER — BOOSTRIX 5-2.5-18.5 LF-MCG/0.5 IM SUSP: 0.5000 mL | Freq: Once | INTRAMUSCULAR | 0 refills | Status: AC

## 2023-01-21 MED ORDER — DEXCOM G7 RECEIVER DEVI: 1.0000 | Freq: Every day | 1 refills | Status: DC

## 2023-01-21 MED ORDER — DEXCOM G7 SENSOR MISC: 1.0000 | Freq: Every day | 1 refills | Status: DC

## 2023-01-21 MED ORDER — ESOMEPRAZOLE MAGNESIUM 40 MG PO CPDR
DELAYED_RELEASE_CAPSULE | ORAL | 1 refills | Status: DC
Start: 2023-01-21 — End: 2023-07-27

## 2023-01-21 MED ORDER — EMPAGLIFLOZIN 25 MG PO TABS: 25.0000 mg | ORAL_TABLET | Freq: Every day | ORAL | 1 refills | Status: DC

## 2023-01-21 MED ORDER — ROSUVASTATIN CALCIUM 10 MG PO TABS: 10.0000 mg | ORAL_TABLET | Freq: Every day | ORAL | 1 refills | Status: DC

## 2023-01-21 NOTE — Progress Notes (Signed)
Subjective:  Patient ID: Diana Reeves, female    DOB: 1950/06/08  Age: 72 y.o. MRN: [id number]  CC: Hypertension and Diabetes   HPI [name] presents for f/up ----  Discussed the use of AI scribe software for clinical note transcription with the patient, who gave verbal consent to proceed.  History of Present Illness   The patient, with a history of atrial fibrillation, presented for a routine follow-up. She reported feeling "pretty good" with no new concerns related to her cardiac condition. She had undergone a mammogram and colonoscopy, with another colonoscopy scheduled for December. She had received the latest shingles vaccine two years ago, and her last recorded tetanus shot was twelve years ago.  She denied any symptoms of hyperglycemia such as excessive thirst or urination, and no drastic changes in weight or appetite were reported.  The patient requested refills for Nexium, Jardiance, and Cymbalta. She reported discontinuing rosuvastatin, her cholesterol medication, due to concerns about taking too many medications. However, she expressed willingness to restart it.  She also reported some recent discomfort in the gallbladder area, which led to a visit to her GI doctor in October. No further details about this discomfort were provided.       Outpatient Medications Prior to Visit  Medication Sig Dispense Refill   Azelastine-Fluticasone 137-50 MCG/ACT SUSP Place 1 spray into both nostrils daily as needed (allergies).     Cholecalciferol 50 MCG (2000 UT) TABS Take 1 tablet (2,000 Units total) by mouth daily. 90 tablet 3   clonazePAM (KLONOPIN) 0.5 MG tablet Take 1 tablet (0.5 mg total) by mouth 2 (two) times daily as needed for anxiety. 60 tablet 3   diltiazem (CARDIZEM CD) 180 MG 24 hr capsule Take 1 capsule (180 mg total) by mouth daily. 90 capsule 3   dorzolamide-timolol (COSOPT) 22.3-6.8 MG/ML ophthalmic solution Place 1 drop into the right eye 2 (two) times daily.      EPINEPHrine 0.3 mg/0.3 mL IJ SOAJ injection Inject 0.3 mg into the muscle as needed for anaphylaxis.     glucose blood (ONETOUCH ULTRA) test strip Use as instructed 100 each 5   NONFORMULARY OR COMPOUNDED ITEM Estradiol vaginal cream 0.02% insert 1 gram vaginally twice weekly.  Dispense 24 grams/3RF 1 each 3   rivaroxaban (XARELTO) 20 MG TABS tablet Take 1 tablet (20 mg total) by mouth daily with supper. 90 tablet 1   DULoxetine (CYMBALTA) 60 MG capsule TAKE 1 CAPSULE BY MOUTH DAILY *MUST RETURN TO MD IN 6 MONTHS FOR FUTURE REFILLS 30 capsule 0   empagliflozin (JARDIANCE) 10 MG TABS tablet Take 1 tablet (10 mg total) by mouth daily. 90 tablet 0   empagliflozin (JARDIANCE) 10 MG TABS tablet Take 10 mg by mouth daily.     esomeprazole (NEXIUM) 40 MG capsule TAKE ONE CAPSULE BY MOUTH EVERY MORNING BEFORE BREAKFAST 90 capsule 1   Sod Picosulfate-Mag Ox-Cit Acd (CLENPIQ) 10-3.5-12 MG-GM -GM/160ML SOLN Take 1 kit by mouth as directed. 320 mL 0   Blood Glucose Calibration (OT ULTRA/FASTTK CNTRL SOLN) SOLN 1 Act by In Vitro route 2 (two) times daily. (Patient not taking: Reported on 01/21/2023) 1 each 2   Blood Glucose Monitoring Suppl (ONE TOUCH ULTRA 2) w/Device KIT 1 Act by Does not apply route 2 (two) times daily. (Patient not taking: Reported on 01/21/2023) 1 kit 1   celecoxib (CELEBREX) 200 MG capsule Take 200 mg by mouth 2 (two) times daily. (Patient not taking: Reported on 01/21/2023)  latanoprost (XALATAN) 0.005 % ophthalmic solution Apply to eye.     rosuvastatin (CRESTOR) 10 MG tablet Take 1 tablet (10 mg total) by mouth daily. (Patient not taking: Reported on 12/24/2022) 90 tablet 1   No facility-administered medications prior to visit.    ROS Review of Systems  Constitutional:  Negative for appetite change, chills, diaphoresis and fatigue.  HENT: Negative.    Eyes: Negative.   Respiratory: Negative.  Negative for cough, chest tightness, shortness of breath and wheezing.    Cardiovascular:  Negative for chest pain, palpitations and leg swelling.  Gastrointestinal:  Negative for abdominal pain, constipation, diarrhea, nausea and vomiting.  Endocrine: Negative.   Genitourinary: Negative.  Negative for difficulty urinating.  Musculoskeletal: Negative.  Negative for myalgias.  Skin: Negative.   Neurological:  Negative for dizziness and weakness.  Hematological:  Negative for adenopathy. Does not bruise/bleed easily.  Psychiatric/Behavioral:  Positive for confusion and decreased concentration.     Objective:  BP 138/78 (BP Location: Left Arm, Patient Position: Sitting, Cuff Size: Normal)   Pulse 70   Temp 97.8 F (36.6 C) (Oral)   Resp 16   Ht 5' 4.5" (1.638 m)   Wt 194 lb 9.6 oz (88.3 kg)   LMP 03/17/1991   SpO2 95%   BMI 32.89 kg/m   BP Readings from Last 3 Encounters:  01/21/23 138/78  12/29/22 128/82  12/24/22 124/70    Wt Readings from Last 3 Encounters:  01/21/23 194 lb 9.6 oz (88.3 kg)  12/29/22 195 lb (88.5 kg)  12/24/22 194 lb (88 kg)    Physical Exam Vitals reviewed.  Constitutional:      Appearance: Normal appearance.  HENT:     Nose: Nose normal.     Mouth/Throat:     Mouth: Mucous membranes are moist.  Eyes:     General: No scleral icterus.    Conjunctiva/sclera: Conjunctivae normal.  Cardiovascular:     Rate and Rhythm: Normal rate and regular rhythm.     Heart sounds: No murmur heard. Pulmonary:     Effort: Pulmonary effort is normal.     Breath sounds: No stridor. No wheezing, rhonchi or rales.  Abdominal:     General: Abdomen is flat.     Palpations: There is no mass.     Tenderness: There is no abdominal tenderness. There is no guarding or rebound.     Hernia: No hernia is present.  Musculoskeletal:        General: Normal range of motion.     Cervical back: Neck supple.     Right lower leg: No edema.     Left lower leg: No edema.  Lymphadenopathy:     Cervical: No cervical adenopathy.  Skin:    General:  Skin is warm and dry.  Neurological:     General: No focal deficit present.     Mental Status: She is alert. Mental status is at baseline.  Psychiatric:        Mood and Affect: Mood normal.        Behavior: Behavior normal.     Lab Results  Component Value Date   WBC 7.6 12/24/2022   HGB 12.8 12/24/2022   HCT 38.9 12/24/2022   PLT 302.0 12/24/2022   GLUCOSE 151 (H) 12/24/2022   CHOL 145 05/13/2022   TRIG 71.0 05/13/2022   HDL 44.90 05/13/2022   LDLCALC 86 05/13/2022   ALT 17 12/24/2022   AST 19 12/24/2022   NA 139 12/24/2022   K  3.4 (L) 12/24/2022   CL 105 12/24/2022   CREATININE 0.59 12/24/2022   BUN 15 12/24/2022   CO2 26 12/24/2022   TSH 1.76 05/13/2022   INR 1.1 (H) 02/20/2021   HGBA1C 7.7 (H) 01/21/2023   MICROALBUR 1.3 01/21/2023    US Abdomen Limited RUQ (LIVER/GB)  Result Date: 07/06/2022 CLINICAL DATA:  Gallbladder polyp. EXAM: ULTRASOUND ABDOMEN LIMITED RIGHT UPPER QUADRANT COMPARISON:  Ultrasound right upper quadrant 07/07/2021 FINDINGS: Gallbladder: Similar-appearing 4-5 mm gallbladder polyp. No gallbladder wall thickening or pericholecystic fluid. Negative sonographic Murphy's sign. Common bile duct: Diameter: 3.5 mm Liver: Mildly increased echogenicity. No focal lesion. Portal vein is patent on color Doppler imaging with normal direction of blood flow towards the liver. Other: None. IMPRESSION: 1. Similar-appearing 4-5 mm gallbladder polyp. Given the possible mild interval change appearance, follow-up right upper quadrant ultrasound is recommended in 12 months. 2. Mildly increased hepatic parenchymal echogenicity suggestive of steatosis. Electronically Signed   By: Annia Belt M.D.   On: 07/06/2022 09:48    Assessment & Plan:   Type II diabetes mellitus with manifestations (HCC)- Her A1C is 7.7%. Will restart the SGLT-2 inhibitor. -     Microalbumin / creatinine urine ratio; Future -     Hemoglobin A1c; Future -     Urinalysis, Routine w reflex microscopic;  Future -     Dexcom G7 Sensor; 1 Act by Does not apply route daily.  Dispense: 9 each; Refill: 1 -     Dexcom G7 Receiver; 1 Act by Does not apply route daily.  Dispense: 9 each; Refill: 1 -     AMB Referral [facility] Care Management -     Empagliflozin; Take 1 tablet (25 mg total) by mouth daily.  Dispense: 90 tablet; Refill: 1  Essential hypertension, benign -     Urinalysis, Routine w reflex microscopic; Future -     AMB Referral [facility] Care Management  Major depressive disorder with single episode, in full remission (HCC) -     DULoxetine HCl; Take 1 capsule (60 mg total) by mouth daily.  Dispense: 90 capsule; Refill: 1  Gastroesophageal reflux disease with esophagitis without hemorrhage -     Esomeprazole Magnesium; TAKE ONE CAPSULE BY MOUTH EVERY MORNING BEFORE BREAKFAST  Dispense: 90 capsule; Refill: 1  Hyperlipidemia with target LDL less than 100- Will restart the statin. -     Rosuvastatin Calcium; Take 1 tablet (10 mg total) by mouth daily.  Dispense: 90 tablet; Refill: 1  Need for prophylactic vaccination with combined diphtheria-tetanus-pertussis (DTP) vaccine -     Boostrix; Inject 0.5 mLs into the muscle once for 1 dose.  Dispense: 0.5 mL; Refill: 0  PAF (paroxysmal atrial fibrillation) (HCC)- She has good R/R control.     Follow-up: Return in about 6 months (around 07/21/2023).  Sanda Linger, MD

## 2023-01-21 NOTE — Patient Instructions (Signed)

## 2023-01-26 DIAGNOSIS — J3081 Allergic rhinitis due to animal (cat) (dog) hair and dander: Secondary | ICD-10-CM | POA: Diagnosis not present

## 2023-01-26 DIAGNOSIS — J3089 Other allergic rhinitis: Secondary | ICD-10-CM | POA: Diagnosis not present

## 2023-01-26 DIAGNOSIS — J301 Allergic rhinitis due to pollen: Secondary | ICD-10-CM | POA: Diagnosis not present

## 2023-01-28 ENCOUNTER — Telehealth: Payer: Self-pay

## 2023-01-28 NOTE — Progress Notes (Signed)
   Care Guide Note  01/28/2023 Name: HENDY SONNE MRN: [id number] DOB: 07/04/1950  Referred by: Etta Grandchild, MD Reason for referral : Care Coordination (Outreach to schedule with Pharm d )   ISREAL RUNQUIST is a 72 y.o. year old female who is a primary care patient of Etta Grandchild, MD. KAYMANI FAUTH was referred to the pharmacist for assistance related to HTN.    Successful contact was made with the patient to discuss pharmacy services including being ready for the pharmacist to call at least 5 minutes before the scheduled appointment time, to have medication bottles and any blood sugar or blood pressure readings ready for review. The patient agreed to meet with the pharmacist via with the pharmacist via telephone visit on (date/time).  02/01/2023  Penne Lash, RMA Care Guide [facility]  [place], [place] [address] Direct Dial: [phone] [email or web address]

## 2023-02-01 ENCOUNTER — Other Ambulatory Visit: Payer: Medicare PPO

## 2023-02-01 DIAGNOSIS — J3089 Other allergic rhinitis: Secondary | ICD-10-CM | POA: Diagnosis not present

## 2023-02-01 DIAGNOSIS — J3081 Allergic rhinitis due to animal (cat) (dog) hair and dander: Secondary | ICD-10-CM | POA: Diagnosis not present

## 2023-02-01 DIAGNOSIS — J301 Allergic rhinitis due to pollen: Secondary | ICD-10-CM | POA: Diagnosis not present

## 2023-02-04 IMAGING — US US ABDOMEN LIMITED
1 series · 15 of 25 positions shown · non-contrast
Comparison: Right upper quadrant ultrasound March 07, 2018

CLINICAL DATA: Nausea and vomiting.

EXAM:
ULTRASOUND ABDOMEN LIMITED RIGHT UPPER QUADRANT

[Series 1: us abdomen limited ruq mc & wl · 15 of 89 slices shown]
[im 1/89]
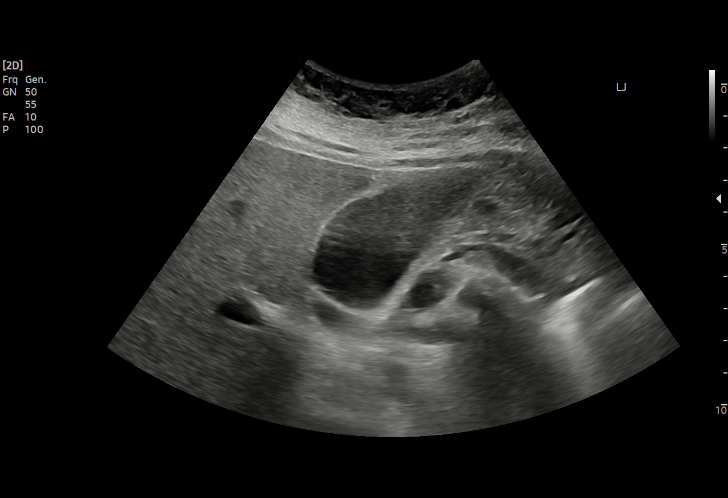
[im 8/89]
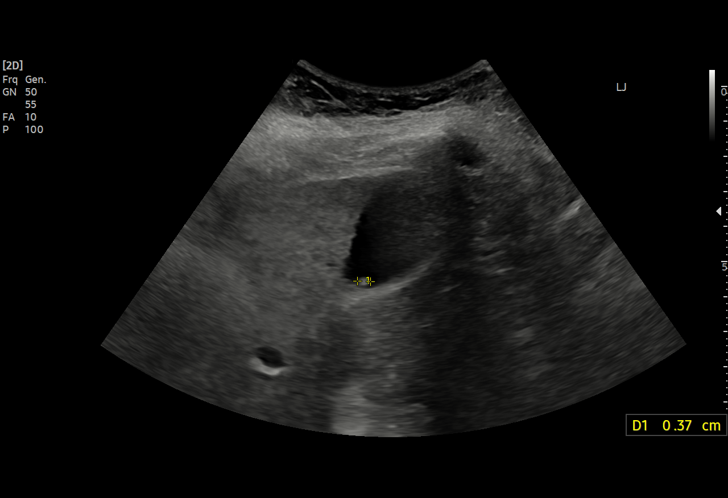
[im 15/89]
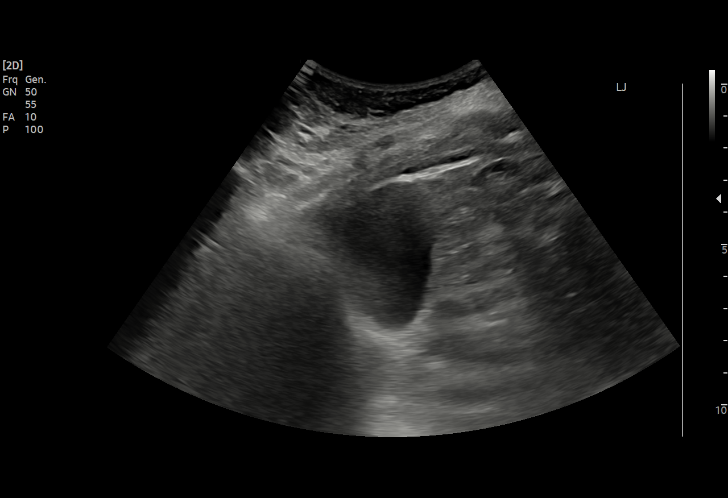
[im 19/89]
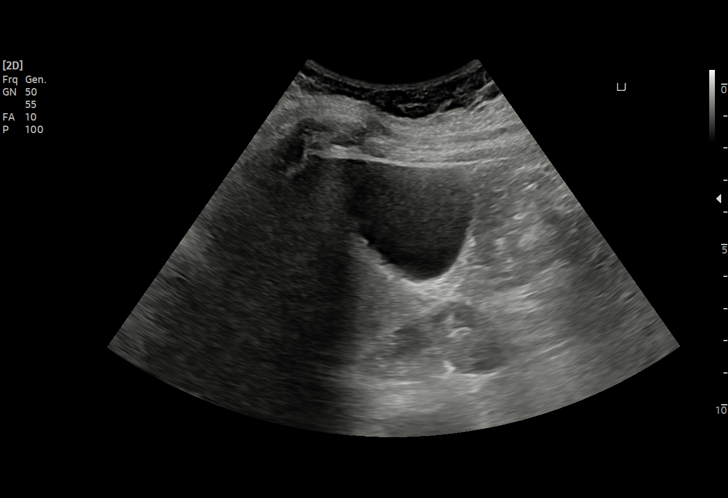
[im 26/89]
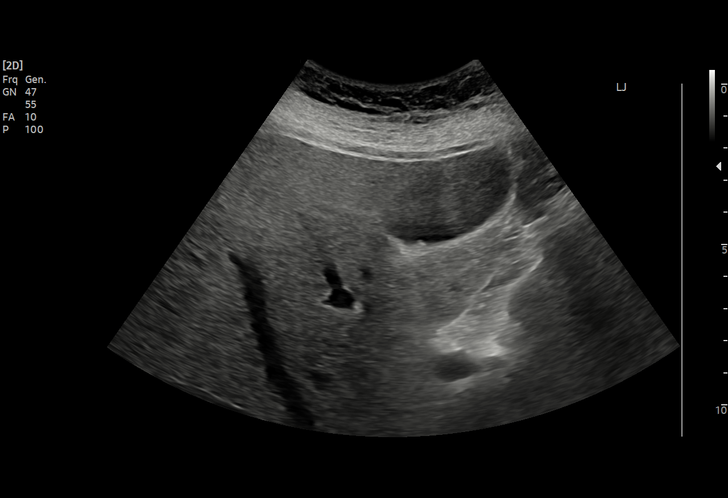
[im 34/89]
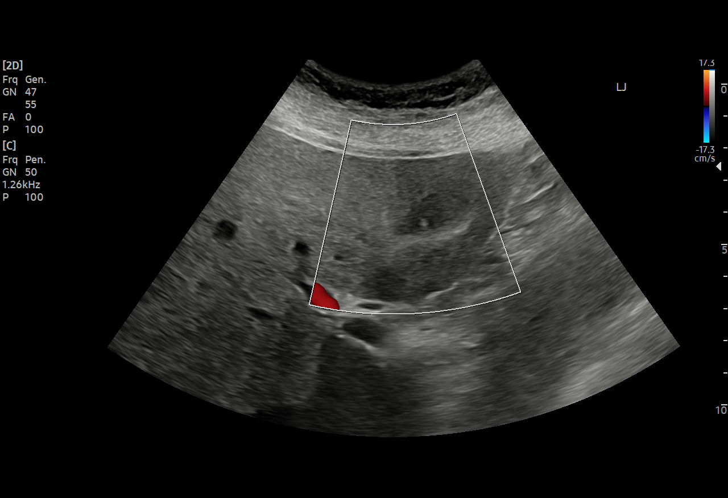
[im 37/89]
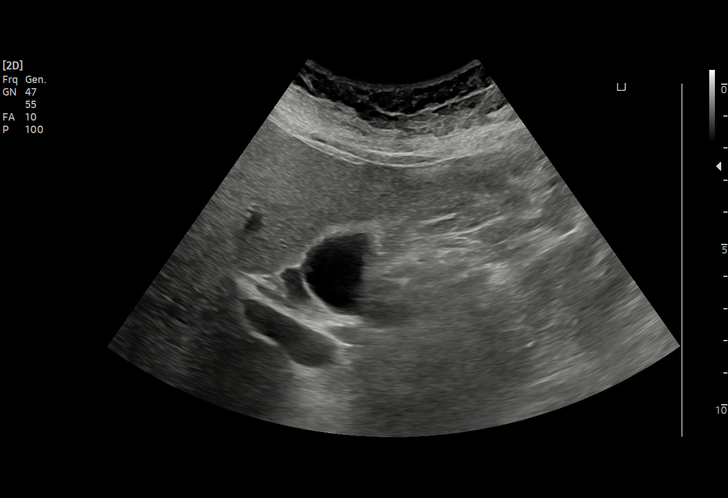
[im 45/89]
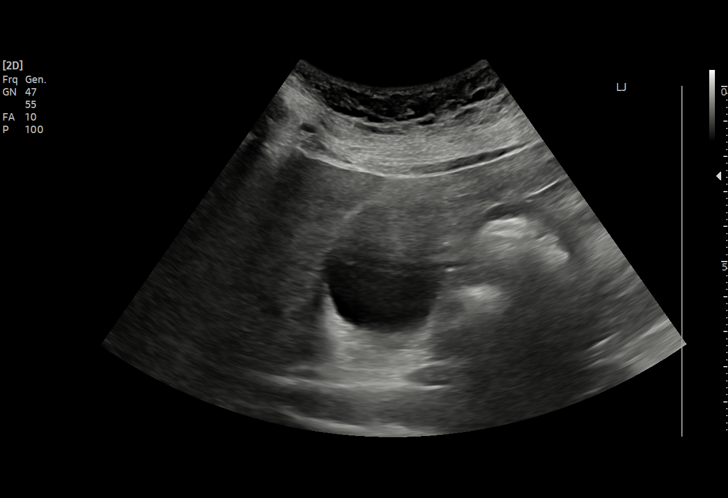
[im 52/89]
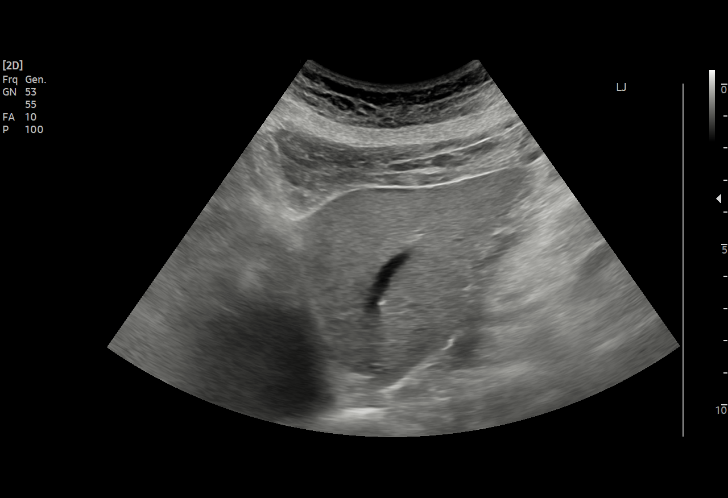
[im 56/89]
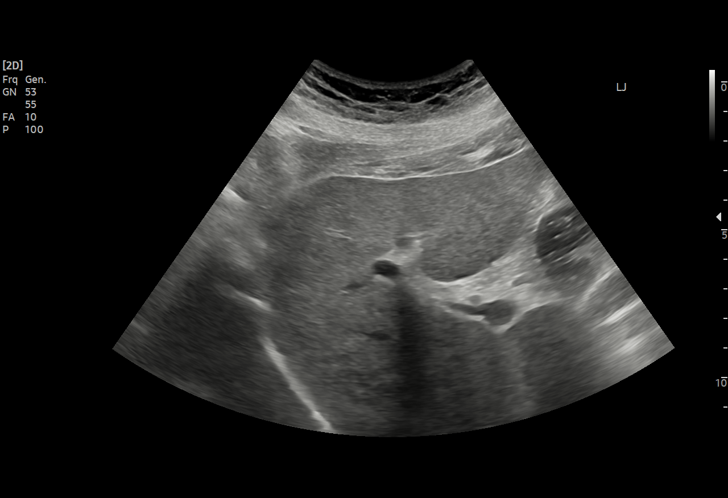
[im 63/89]
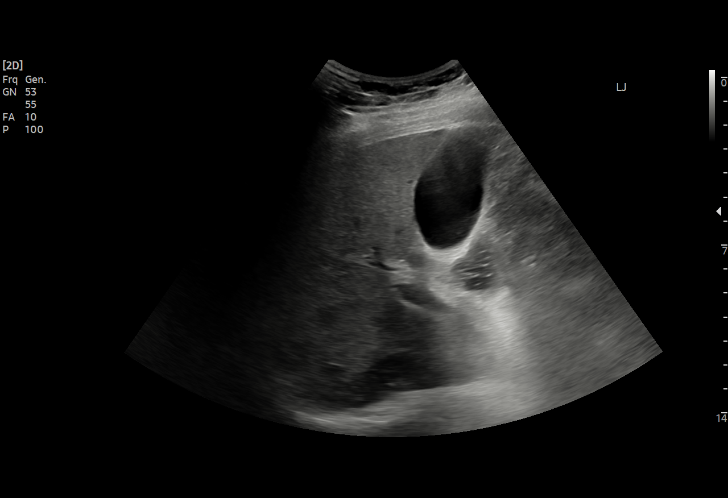
[im 70/89]
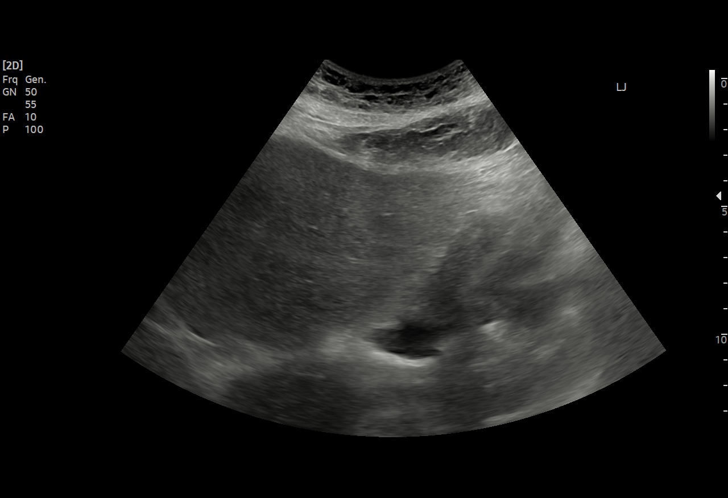
[im 74/89]
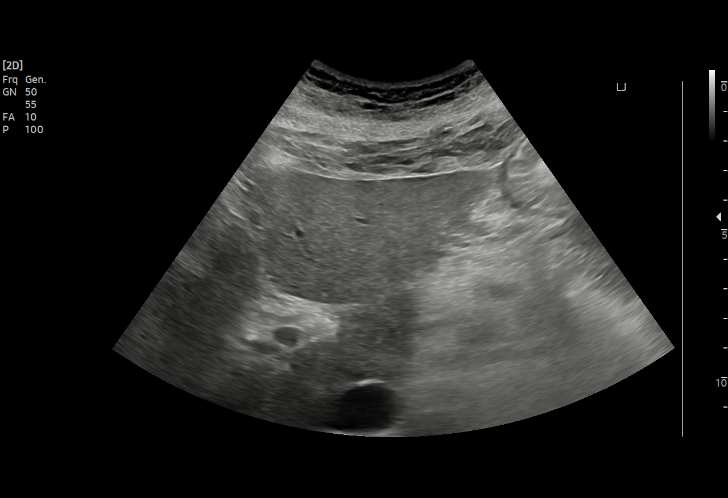
[im 81/89]
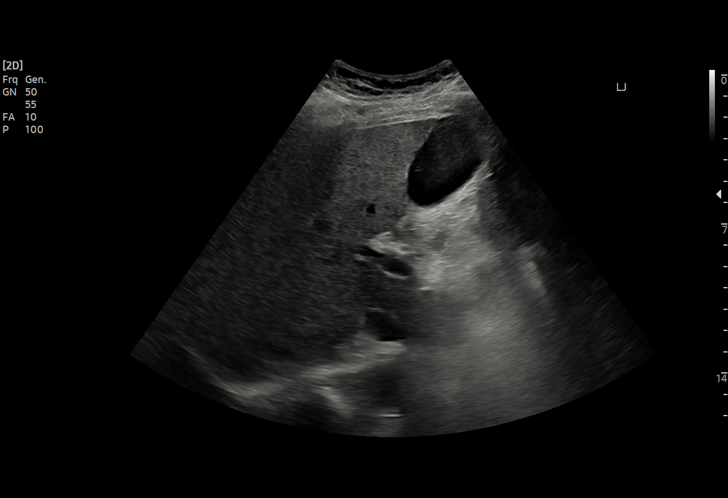
[im 89/89]
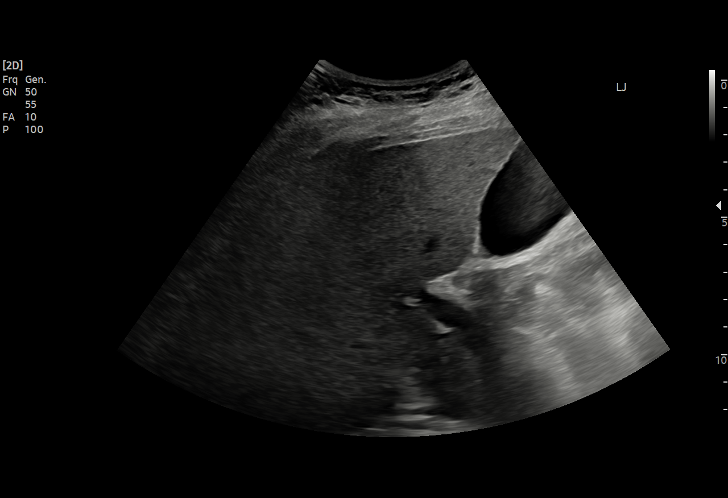

[15 of 25 positions shown; findings below may reference images not displayed]

FINDINGS: Gallbladder:

There is a non mobile hyperechoic rounded region measuring 4 mm
adjacent to the gallbladder wall. No wall thickening,
pericholecystic fluid, or stone. Mild sludge identified.

Common bile duct:

Diameter: 3 mm

Liver:

Diffuse increased echogenicity throughout the liver. No focal mass.
Portal vein is patent on color Doppler imaging with normal direction
of blood flow towards the liver.

Other: None.
IMPRESSION: 1. Mild sludge in the gallbladder. No stones, wall thickening, or
pericholecystic fluid.
2. The 4 mm non mobile hyperechoic mass associated the gallbladder
wall is consistent with a small polyp. Follow-up imaging is not
recommended for polyps less than 6 mm.
3. Diffuse increased echogenicity throughout the liver is
nonspecific but often seen with hepatic steatosis.

## 2023-02-09 DIAGNOSIS — J301 Allergic rhinitis due to pollen: Secondary | ICD-10-CM | POA: Diagnosis not present

## 2023-02-09 DIAGNOSIS — J3081 Allergic rhinitis due to animal (cat) (dog) hair and dander: Secondary | ICD-10-CM | POA: Diagnosis not present

## 2023-02-09 DIAGNOSIS — J3089 Other allergic rhinitis: Secondary | ICD-10-CM | POA: Diagnosis not present

## 2023-02-15 DIAGNOSIS — J301 Allergic rhinitis due to pollen: Secondary | ICD-10-CM | POA: Diagnosis not present

## 2023-02-15 DIAGNOSIS — J3081 Allergic rhinitis due to animal (cat) (dog) hair and dander: Secondary | ICD-10-CM | POA: Diagnosis not present

## 2023-02-15 DIAGNOSIS — J3089 Other allergic rhinitis: Secondary | ICD-10-CM | POA: Diagnosis not present

## 2023-02-16 ENCOUNTER — Other Ambulatory Visit: Payer: Self-pay | Admitting: Internal Medicine

## 2023-02-16 ENCOUNTER — Other Ambulatory Visit: Payer: Medicare PPO

## 2023-02-16 ENCOUNTER — Telehealth: Payer: Self-pay | Admitting: Nurse Practitioner

## 2023-02-16 DIAGNOSIS — F409 Phobic anxiety disorder, unspecified: Secondary | ICD-10-CM

## 2023-02-16 DIAGNOSIS — E118 Type 2 diabetes mellitus with unspecified complications: Secondary | ICD-10-CM

## 2023-02-16 MED ORDER — ZOLPIDEM TARTRATE 5 MG PO TABS: 5.0000 mg | ORAL_TABLET | Freq: Every evening | ORAL | 2 refills | Status: DC | PRN

## 2023-02-16 NOTE — Telephone Encounter (Signed)
Inbound call from patient stating she needs prep sent in for her colonoscopy scheduled for 12/10. Please advise.

## 2023-02-16 NOTE — Telephone Encounter (Signed)
Left message for pt to call back  °

## 2023-02-16 NOTE — Progress Notes (Unsigned)
02/16/2023 Name: Diana Reeves MRN: [id number] DOB: 1950/07/27  Chief Complaint  Patient presents with   Diabetes   Hypertension   Medication Management    Diana Reeves is a 72 y.o. year old female who presented for a telephone visit.   They were referred to the pharmacist by their PCP for assistance in managing diabetes and hypertension.    Subjective:  Care Team: Primary Care Provider: Etta Grandchild, MD ; Next Scheduled Visit: 05/17/2023  Medication Access/Adherence  Current Pharmacy:  [facility] [id number] [place], [place] - [address] [place] [address] Phone: [phone] Fax: [phone]  [facility] [address] [place] [address] Phone: [phone] Fax: [phone]   Patient reports affordability concerns with their medications: Yes  Patient reports access/transportation concerns to their pharmacy: No  Patient reports adherence concerns with their medications:  No   *Reports Xarelto and Jardiance are expensive but currently affording them  Diabetes:  Current medications: Jardiance 25 mg daily Medications tried in the past: Ozempic  Pt not checking BG. Dexcom Rx sent but has not gotten.     Hypertension:  Current medications: diltiazem 180 mg daily (also for afib) Medications previously tried: olmesartan (d/c by pt preference)   Objective:  Lab Results  Component Value Date   HGBA1C 7.7 (H) 01/21/2023    Lab Results  Component Value Date   CREATININE 0.59 12/24/2022   BUN 15 12/24/2022   NA 139 12/24/2022   K 3.4 (L) 12/24/2022   CL 105 12/24/2022   CO2 26 12/24/2022    Lab Results  Component Value Date   CHOL 145 05/13/2022   HDL 44.90 05/13/2022   LDLCALC 86 05/13/2022   TRIG 71.0 05/13/2022   CHOLHDL 3 05/13/2022    Medications Reviewed Today     Reviewed by Bonita Quin, RPH (Pharmacist) on 02/16/23 at 1539  Med List Status: <None>    Medication Order Taking? Sig Documenting Provider Last Dose Status Informant  Azelastine-Fluticasone 137-50 MCG/ACT SUSP [id number]  Place 1 spray into both nostrils daily as needed (allergies). [provider]  Active Self           Med Note Gunnar Fusi, MELISSA R   Tue Apr 15, 2021  1:38 PM) Dym[place]   Blood Glucose Calibration (OT ULTRA/FASTTK CNTRL SOLN) SOLN [id number]  1 Act by In Vitro route 2 (two) times daily.  Patient not taking: Reported on 01/21/2023   Etta Grandchild, MD  Active Self  Blood Glucose Monitoring Suppl (ONE TOUCH ULTRA 2) w/Device KIT [id number] No 1 Act by Does not apply route 2 (two) times daily.  Patient not taking: Reported on 01/21/2023   Etta Grandchild, MD Not Taking Active Self  Cholecalciferol 50 MCG (2000 UT) TABS [id number] Yes Take 1 tablet (2,000 Units total) by mouth daily. Etta Grandchild, MD Taking Active   clonazePAM Scarlette Calico) 0.5 MG tablet [id number] Yes Take 1 tablet (0.5 mg total) by mouth 2 (two) times daily as needed for anxiety. Etta Grandchild, MD Taking Active     Discontinued 02/16/23 1538 (Not covered by the pt's insurance)     Discontinued 02/16/23 1538 (Not covered by the pt's insurance)   diltiazem (CARDIZEM CD) 180 MG 24 hr capsule [id number] Yes Take 1 capsule (180 mg total) by mouth daily. Lanier Prude, MD Taking Active   dorzolamide-timolol (COSOPT) 22.3-6.8 MG/ML ophthalmic solution [id number]  Place 1 drop into  the right eye 2 (two) times daily. [provider]  Active Self  DULoxetine (CYMBALTA) 60 MG capsule [id number] Yes Take 1 capsule (60 mg total) by mouth daily. Etta Grandchild, MD Taking Active   empagliflozin (JARDIANCE) 25 MG TABS tablet [id number] Yes Take 1 tablet (25 mg total) by mouth daily. Etta Grandchild, MD Taking Active   EPINEPHrine 0.3 mg/0.3 mL IJ SOAJ injection [id number]  Inject 0.3 mg into the muscle as needed for anaphylaxis. [provider]  Active Self           Med Note [name] Jan 22, 2021 10:49 AM)    esomeprazole (NEXIUM) 40 MG capsule [id number] Yes TAKE ONE CAPSULE BY MOUTH EVERY MORNING BEFORE [place] Etta Grandchild, MD Taking Active   glucose blood [facility] ULTRA) test strip [id number]  Use as instructed Etta Grandchild, MD  Active Self  NONFORMULARY OR COMPOUNDED Sula Soda [id number]  Estradiol vaginal cream 0.02% insert 1 gram vaginally twice weekly.  Dispense 24 grams/3RF Wyline Beady A, NP  Active   rivaroxaban (XARELTO) 20 MG TABS tablet [id number] Yes Take 1 tablet (20 mg total) by mouth daily with supper. Croitoru, Mihai, MD Taking Active   rosuvastatin (CRESTOR) 10 MG tablet [id number] Yes Take 1 tablet (10 mg total) by mouth daily. Etta Grandchild, MD Taking Active              Assessment/Plan:   Diabetes: - Currently uncontrolled, Goal A1c <7.5% - Reviewed goal A1c - Reviewed dietary modifications including smaller portion sizes of carbs, increasing protein, pairing protein with carbs - Recommend to continue current regimen. Await next A1c since pt had been off of Jardiance for last A1c  - Insurance will not cover CGM since patient is not on insulin - Meets financial criteria for Jardiance patient assistance program through [facility]. Will initiate application.   Hypertension: - Currently uncontrolled, BP goal <130/80. Prior to last visit, BP typically at goal - Recommend to continue current regimen    Osteopenia:  Recommended starting calcium citrate 1000 mg daily + Vitamin D Can stop extra vitamin D once starts calcium+VitD   Pt requests zolpidem refill which is no longer on her med list  Follow Up Plan: 1/21  Arbutus Leas, PharmD, BCPS, CPP Clinical Pharmacist Practitioner [facility] [phone]

## 2023-02-16 NOTE — Patient Instructions (Signed)
It was a pleasure speaking with you today!  I have started the Jardiance patient assistance application for you. It will be at the front desk for your signature.  Start calcium citrate 500 mg twice daily with Vitamin D for your bone health.  Feel free to call with any questions or concerns!  Arbutus Leas, PharmD, BCPS [facility] Clinical Pharmacist [facility] [phone]

## 2023-02-18 NOTE — Telephone Encounter (Signed)
Left message for patient to call back to discuss prep & xarelto hold.

## 2023-02-19 NOTE — Telephone Encounter (Signed)
Left message for patient to call back  

## 2023-02-19 NOTE — Telephone Encounter (Signed)
Spoke with patient & she's been advised that prep was sent to her pharmacy back in October. She says she does have an order that is ready, but is unsure if that's what it is. She will call to find out & if it is not the prep, then she will call back to let us know so we can send in new order. We discussed Xarelto hold prior to procedure, which she was already aware about. Discussed procedure date/time. She's been advised to all back with any questions. Pt verbalized all understanding.

## 2023-02-23 ENCOUNTER — Encounter: Payer: Self-pay | Admitting: Gastroenterology

## 2023-02-23 ENCOUNTER — Ambulatory Visit (AMBULATORY_SURGERY_CENTER): Payer: Medicare PPO | Admitting: Gastroenterology

## 2023-02-23 VITALS — BP 120/56 | HR 69 | Temp 97.9°F | Resp 9 | Ht 65.0 in | Wt 194.0 lb

## 2023-02-23 DIAGNOSIS — Z1211 Encounter for screening for malignant neoplasm of colon: Secondary | ICD-10-CM

## 2023-02-23 DIAGNOSIS — K5730 Diverticulosis of large intestine without perforation or abscess without bleeding: Secondary | ICD-10-CM | POA: Diagnosis not present

## 2023-02-23 DIAGNOSIS — I4891 Unspecified atrial fibrillation: Secondary | ICD-10-CM | POA: Diagnosis not present

## 2023-02-23 DIAGNOSIS — Z860101 Personal history of adenomatous and serrated colon polyps: Secondary | ICD-10-CM | POA: Diagnosis not present

## 2023-02-23 DIAGNOSIS — K644 Residual hemorrhoidal skin tags: Secondary | ICD-10-CM | POA: Diagnosis not present

## 2023-02-23 DIAGNOSIS — Z860100 Personal history of colon polyps, unspecified: Secondary | ICD-10-CM

## 2023-02-23 DIAGNOSIS — F32A Depression, unspecified: Secondary | ICD-10-CM | POA: Diagnosis not present

## 2023-02-23 DIAGNOSIS — K648 Other hemorrhoids: Secondary | ICD-10-CM | POA: Diagnosis not present

## 2023-02-23 MED ORDER — SODIUM CHLORIDE 0.9 % IV SOLN: 500.0000 mL | Freq: Once | INTRAVENOUS | Status: DC

## 2023-02-23 NOTE — Progress Notes (Unsigned)
Pt's states no medical or surgical changes since previsit or office visit. 

## 2023-02-23 NOTE — Patient Instructions (Signed)
No repeat colonoscopy due to age. Resume Xarelto at prior dose today. Continue present medications. Resume previous diet.  Handout on diverticulosis and hemorrhoids provided.  YOU HAD AN ENDOSCOPIC PROCEDURE TODAY AT THE [facility]:   Refer to the procedure report that was given to you for any specific questions about what was found during the examination.  If the procedure report does not answer your questions, please call your gastroenterologist to clarify.  If you requested that your care partner not be given the details of your procedure findings, then the procedure report has been included in a sealed envelope for you to review at your convenience later.  YOU SHOULD EXPECT: Some feelings of bloating in the abdomen. Passage of more gas than usual.  Walking can help get rid of the air that was put into your GI tract during the procedure and reduce the bloating. If you had a lower endoscopy (such as a colonoscopy or flexible sigmoidoscopy) you may notice spotting of blood in your stool or on the toilet paper. If you underwent a bowel prep for your procedure, you may not have a normal bowel movement for a few days.  Please Note:  You might notice some irritation and congestion in your nose or some drainage.  This is from the oxygen used during your procedure.  There is no need for concern and it should clear up in a day or so.  SYMPTOMS TO REPORT IMMEDIATELY:  Following lower endoscopy (colonoscopy or flexible sigmoidoscopy):  Excessive amounts of blood in the stool  Significant tenderness or worsening of abdominal pains  Swelling of the abdomen that is new, acute  Fever of 100F or higher For urgent or emergent issues, a gastroenterologist can be reached at any hour by calling [phone]. Do not use MyChart messaging for urgent concerns.    DIET:  We do recommend a small meal at first, but then you may proceed to your regular diet.  Drink plenty of fluids but you should  avoid alcoholic beverages for 24 hours.  ACTIVITY:  You should plan to take it easy for the rest of today and you should NOT DRIVE or use heavy machinery until tomorrow (because of the sedation medicines used during the test).    FOLLOW UP: Our staff will call the number listed on your records the next business day following your procedure.  We will call around 7:15- 8:00 am to check on you and address any questions or concerns that you may have regarding the information given to you following your procedure. If we do not reach you, we will leave a message.     If any biopsies were taken you will be contacted by phone or by letter within the next 1-3 weeks.  Please call us at [phone] if you have not heard about the biopsies in 3 weeks.    SIGNATURES/CONFIDENTIALITY: You and/or your care partner have signed paperwork which will be entered into your electronic medical record.  These signatures attest to the fact that that the information above on your After Visit Summary has been reviewed and is understood.  Full responsibility of the confidentiality of this discharge information lies with you and/or your care-partner.

## 2023-02-23 NOTE — Progress Notes (Unsigned)
[facility] History and Physical   Primary Care Physician:  Etta Grandchild, MD   Reason for Procedure:  History of adenomatous colon polyps  Plan:    Surveillance colonoscopy with possible interventions as needed     HPI: Diana Reeves is a very pleasant 72 y.o. female here for surveillance colonoscopy. Denies any nausea, vomiting, abdominal pain, melena or bright red blood per rectum  The risks and benefits as well as alternatives of endoscopic procedure(s) have been discussed and reviewed. All questions answered. The patient agrees to proceed.    Past Medical History:  Diagnosis Date   A-fib [facility])    Allergic state 12/26/2016   Basal cell carcinoma    Benign neoplasm of colon    Degenerative cervical disc    Depression    Diverticulosis of colon (without mention of hemorrhage)    Esophageal reflux    Glaucoma    Osteoporosis, unspecified 12/2018   2007 T score -3.0, 2012 T score -1.9, 2015 T score -2.1 overall stable, 2018 T score -2.0, 2020 T score -1.8   Other chronic nonalcoholic liver disease    Sinusitis 12/26/2016   Stricture and stenosis of esophagus     Past Surgical History:  Procedure Laterality Date   ABDOMINAL HYSTERECTOMY  1993   TAH-LSO-Post. repair-Burch   ATRIAL FIBRILLATION ABLATION N/A 04/21/2021   Procedure: ATRIAL FIBRILLATION ABLATION;  Surgeon: Lanier Prude, MD;  Location: [facility] INVASIVE CV LAB;  Service: Cardiovascular;  Laterality: N/A;   CARDIOVERSION N/A 11/28/2020   Procedure: CARDIOVERSION;  Surgeon: Jake Bathe, MD;  Location: [facility] ENDOSCOPY;  Service: Cardiovascular;  Laterality: N/A;   CARDIOVERSION N/A 01/27/2021   Procedure: CARDIOVERSION;  Surgeon: Pricilla Riffle, MD;  Location: [facility] ENDOSCOPY;  Service: Cardiovascular;  Laterality: N/A;   COLONOSCOPY     EYE SURGERY  10/18/2012   lt eye   knee arthroscop Right 2018   left arm surgery  7-09   POLYPECTOMY      Prior to Admission medications   Medication Sig Start  Date End Date Taking? Authorizing Provider  clonazePAM (KLONOPIN) 0.5 MG tablet Take 1 tablet (0.5 mg total) by mouth 2 (two) times daily as needed for anxiety. 07/06/22 07/06/23 Yes Etta Grandchild, MD  diltiazem (CARDIZEM CD) 180 MG 24 hr capsule Take 1 capsule (180 mg total) by mouth daily. 12/04/22  Yes Lanier Prude, MD  dorzolamide-timolol (COSOPT) 22.3-6.8 MG/ML ophthalmic solution Place 1 drop into the right eye 2 (two) times daily.   Yes [provider]  empagliflozin (JARDIANCE) 25 MG TABS tablet Take 1 tablet (25 mg total) by mouth daily. 01/21/23  Yes Etta Grandchild, MD  esomeprazole (NEXIUM) 40 MG capsule TAKE ONE CAPSULE BY MOUTH EVERY MORNING BEFORE BREAKFAST 01/21/23  Yes Etta Grandchild, MD  glucose blood (ONETOUCH ULTRA) test strip Use as instructed 12/04/20  Yes Etta Grandchild, MD  rivaroxaban (XARELTO) 20 MG TABS tablet Take 1 tablet (20 mg total) by mouth daily with supper. 11/02/22  Yes Croitoru, Mihai, MD  rosuvastatin (CRESTOR) 10 MG tablet Take 1 tablet (10 mg total) by mouth daily. 01/21/23  Yes Etta Grandchild, MD  Azelastine-Fluticasone 137-50 MCG/ACT SUSP Place 1 spray into both nostrils daily as needed (allergies).    [provider]  Blood Glucose Calibration (OT ULTRA/FASTTK CNTRL SOLN) SOLN 1 Act by In Vitro route 2 (two) times daily. Patient not taking: Reported on 01/21/2023 12/04/20   Etta Grandchild, MD  Blood Glucose  Monitoring Suppl (ONE TOUCH ULTRA 2) w/Device KIT 1 Act by Does not apply route 2 (two) times daily. Patient not taking: Reported on 01/21/2023 12/04/20   Etta Grandchild, MD  DULoxetine (CYMBALTA) 60 MG capsule Take 1 capsule (60 mg total) by mouth daily. 01/21/23   Etta Grandchild, MD  EPINEPHrine 0.3 mg/0.3 mL IJ SOAJ injection Inject 0.3 mg into the muscle as needed for anaphylaxis. 08/24/12   [provider]  metroNIDAZOLE (METROGEL) 0.75 % vaginal gel Place 1 Applicatorful vaginally as needed (As needed per patient). As  needed 10/23/19   [provider]  NONFORMULARY OR COMPOUNDED ITEM Estradiol vaginal cream 0.02% insert 1 gram vaginally twice weekly.  Dispense 24 grams/3RF 12/29/22   Wyline Beady A, NP  zolpidem (AMBIEN) 5 MG tablet Take 1 tablet (5 mg total) by mouth at bedtime as needed for sleep. 02/16/23   Etta Grandchild, MD    Current Outpatient Medications  Medication Sig Dispense Refill   clonazePAM (KLONOPIN) 0.5 MG tablet Take 1 tablet (0.5 mg total) by mouth 2 (two) times daily as needed for anxiety. 60 tablet 3   diltiazem (CARDIZEM CD) 180 MG 24 hr capsule Take 1 capsule (180 mg total) by mouth daily. 90 capsule 3   dorzolamide-timolol (COSOPT) 22.3-6.8 MG/ML ophthalmic solution Place 1 drop into the right eye 2 (two) times daily.     empagliflozin (JARDIANCE) 25 MG TABS tablet Take 1 tablet (25 mg total) by mouth daily. 90 tablet 1   esomeprazole (NEXIUM) 40 MG capsule TAKE ONE CAPSULE BY MOUTH EVERY MORNING BEFORE BREAKFAST 90 capsule 1   glucose blood (ONETOUCH ULTRA) test strip Use as instructed 100 each 5   rivaroxaban (XARELTO) 20 MG TABS tablet Take 1 tablet (20 mg total) by mouth daily with supper. 90 tablet 1   rosuvastatin (CRESTOR) 10 MG tablet Take 1 tablet (10 mg total) by mouth daily. 90 tablet 1   Azelastine-Fluticasone 137-50 MCG/ACT SUSP Place 1 spray into both nostrils daily as needed (allergies).     Blood Glucose Calibration (OT ULTRA/FASTTK CNTRL SOLN) SOLN 1 Act by In Vitro route 2 (two) times daily. (Patient not taking: Reported on 01/21/2023) 1 each 2   Blood Glucose Monitoring Suppl (ONE TOUCH ULTRA 2) w/Device KIT 1 Act by Does not apply route 2 (two) times daily. (Patient not taking: Reported on 01/21/2023) 1 kit 1   DULoxetine (CYMBALTA) 60 MG capsule Take 1 capsule (60 mg total) by mouth daily. 90 capsule 1   EPINEPHrine 0.3 mg/0.3 mL IJ SOAJ injection Inject 0.3 mg into the muscle as needed for anaphylaxis.     metroNIDAZOLE (METROGEL) 0.75 % vaginal gel  Place 1 Applicatorful vaginally as needed (As needed per patient). As needed     NONFORMULARY OR COMPOUNDED ITEM Estradiol vaginal cream 0.02% insert 1 gram vaginally twice weekly.  Dispense 24 grams/3RF 1 each 3   zolpidem (AMBIEN) 5 MG tablet Take 1 tablet (5 mg total) by mouth at bedtime as needed for sleep. 15 tablet 2   Current Facility-Administered Medications  Medication Dose Route Frequency Provider Last Rate Last Admin   0.9 %  sodium chloride infusion  500 mL Intravenous Once Napoleon Form, MD        Allergies as of 02/23/2023 - Review Complete 02/23/2023  Allergen Reaction Noted   Ozempic (0.25 or 0.5 mg-dose) [semaglutide(0.25 or 0.5mg -dos)] Nausea And Vomiting 01/21/2023   Dust mite extract  03/04/2011   Mold extract [trichophyton mentagrophyte]  03/04/2011  Family History  Problem Relation Age of Onset   Heart disease Mother    Cancer Father        colon   Hypertension Father    Colon cancer Father 55   Cancer Maternal Grandmother        uterine   Diabetes Paternal Grandfather    Cancer Son        Testicular cancer   Heart attack Son    Stomach cancer Neg Hx    Rectal cancer Neg Hx     Social History   Socioeconomic History   Marital status: Divorced    Spouse name: Not on file   Number of children: 3   Years of education: Not on file   Highest education level: Bachelor's degree (e.g., BA, AB, BS)  Occupational History   Occupation: retired  Tobacco Use   Smoking status: Former   Smokeless tobacco: Never   Tobacco comments:    In college- 50 yeas ago-  Vaping Use   Vaping status: Never Used  Substance and Sexual Activity   Alcohol use: Not Currently   Drug use: No   Sexual activity: Not Currently    Birth control/protection: Surgical    Comment: 1st intercourse 72 yo,,more than 5 partner,, hysterectomy,,no std hx,,DES negative  Other Topics Concern   Not on file  Social History Narrative   Not on file   Social Determinants of Health    Financial Resource Strain: Low Risk  (01/19/2023)   Overall Financial Resource Strain (CARDIA)    Difficulty of Paying Living Expenses: Not hard at all  Food Insecurity: No Food Insecurity (01/19/2023)   Hunger Vital Sign    Worried About Running Out of Food in the Last Year: Never true    Ran Out of Food in the Last Year: Never true  Transportation Needs: No Transportation Needs (01/19/2023)   PRAPARE - Administrator, Civil Service (Medical): No    Lack of Transportation (Non-Medical): No  Physical Activity: Sufficiently Active (01/19/2023)   Exercise Vital Sign    Days of Exercise per Week: 3 days    Minutes of Exercise per Session: 50 min  Recent Concern: Physical Activity - Insufficiently Active (10/28/2022)   Exercise Vital Sign    Days of Exercise per Week: 3 days    Minutes of Exercise per Session: 40 min  Stress: No Stress Concern Present (01/19/2023)   [employer] of Occupational Health - Occupational Stress Questionnaire    Feeling of Stress : Only a little  Social Connections: Moderately Integrated (01/19/2023)   Social Connection and Isolation Panel [NHANES]    Frequency of Communication with Friends and Family: More than three times a week    Frequency of Social Gatherings with Friends and Family: Three times a week    Attends Religious Services: More than 4 times per year    Active Member of Clubs or Organizations: Yes    Attends Banker Meetings: More than 4 times per year    Marital Status: Divorced  Intimate Partner Violence: Not At Risk (09/26/2021)   Humiliation, Afraid, Rape, and Kick questionnaire    Fear of Current or Ex-Partner: No    Emotionally Abused: No    Physically Abused: No    Sexually Abused: No    Review of Systems:  All other review of systems negative except as mentioned in the HPI.  Physical Exam: Vital signs in last 24 hours: BP 123/78   Pulse 76  Temp 97.9 F (36.6 C) (Temporal)   Ht 5\' 5"  (1.651 m)    Wt 194 lb (88 kg)   LMP 03/17/1991   SpO2 96%   BMI 32.28 kg/m  General:   Alert, NAD Lungs:  Clear .   Heart:  Regular rate and rhythm Abdomen:  Soft, nontender and nondistended. Neuro/Psych:  Alert and cooperative. Normal mood and affect. A and O x 3  Reviewed labs, radiology imaging, old records and pertinent past GI work up  Patient is appropriate for planned procedure(s) and anesthesia in an ambulatory setting   K. Scherry Ran , MD [phone]

## 2023-02-23 NOTE — Op Note (Signed)
[facility] Patient Name: Diana Reeves Procedure Date: 02/23/2023 10:37 AM MRN: [id number] Endoscopist: Napoleon Form , MD, [id number] Age: 72 Referring MD:  Date of Birth: Sep 10, 1950 Gender: Female Account #: [id number] Procedure:                Colonoscopy Indications:              High risk colon cancer surveillance: Personal                            history of adenoma (10 mm or greater in size), High                            risk colon cancer surveillance: Personal history of                            multiple (3 or more) adenomas Medicines:                Monitored Anesthesia Care Procedure:                Pre-Anesthesia Assessment:                           - Prior to the procedure, a History and Physical                            was performed, and patient medications and                            allergies were reviewed. The patient's tolerance of                            previous anesthesia was also reviewed. The risks                            and benefits of the procedure and the sedation                            options and risks were discussed with the patient.                            All questions were answered, and informed consent                            was obtained. Prior Anticoagulants: The patient                            last took Xarelto (rivaroxaban) 2 days prior to the                            procedure. ASA Grade Assessment: II - A patient                            with mild systemic disease. After reviewing the  risks and benefits, the patient was deemed in                            satisfactory condition to undergo the procedure.                           After obtaining informed consent, the colonoscope                            was passed under direct vision. Throughout the                            procedure, the patient's blood pressure, pulse, and                            oxygen  saturations were monitored continuously. The                            Olympus Scope [id number] was introduced through the                            anus and advanced to the the cecum, identified by                            appendiceal orifice and ileocecal valve. The                            colonoscopy was performed without difficulty. The                            patient tolerated the procedure well. The quality                            of the bowel preparation was adequate. The                            ileocecal valve, appendiceal orifice, and rectum                            were photographed. Scope In: 10:43:18 AM Scope Out: 10:52:02 AM Scope Withdrawal Time: 0 hours 6 minutes 54 seconds  Total Procedure Duration: 0 hours 8 minutes 44 seconds  Findings:                 The perianal and digital rectal examinations were                            normal.                           Scattered large-mouthed, medium-mouthed and                            small-mouthed diverticula were found in the sigmoid  colon, descending colon, transverse colon and                            ascending colon. There was narrowing of the colon                            in association with the diverticular opening. There                            was evidence of diverticular spasm.                            Peri-diverticular erythema was seen. There was                            evidence of an impacted diverticulum.                           Non-bleeding external and internal hemorrhoids were                            found during retroflexion. The hemorrhoids were                            medium-sized. Complications:            No immediate complications. Estimated Blood Loss:     Estimated blood loss was minimal. Impression:               - Severe diverticulosis in the sigmoid colon, in                            the descending colon, in the transverse colon  and                            in the ascending colon. There was narrowing of the                            colon in association with the diverticular opening.                            There was evidence of diverticular spasm.                            Peri-diverticular erythema was seen. There was                            evidence of an impacted diverticulum.                           - Non-bleeding external and internal hemorrhoids.                           - No specimens collected. Recommendation:           - Patient has a contact number available for  emergencies. The signs and symptoms of potential                            delayed complications were discussed with the                            patient. Return to normal activities tomorrow.                            Written discharge instructions were provided to the                            patient.                           - Resume previous diet.                           - Continue present medications.                           - No repeat colonoscopy due to age.                           - Resume Xarelto (rivaroxaban) at prior dose today.                            Refer to managing physician for further adjustment                            of therapy. Napoleon Form, MD 02/23/2023 10:57:34 AM This report has been signed electronically.

## 2023-02-23 NOTE — Progress Notes (Unsigned)
Sedate, gd SR, tolerated procedure well, VSS, report to RN 

## 2023-02-24 ENCOUNTER — Telehealth: Payer: Self-pay

## 2023-02-24 NOTE — Telephone Encounter (Signed)
  Follow up Call-     02/23/2023    9:59 AM 08/25/2021    9:55 AM  Call back number  Post procedure Call Back phone  # [phone]   Permission to leave phone message Yes Yes     Patient questions:  Do you have a fever, pain , or abdominal swelling? No. Pain Score  0 *  Have you tolerated food without any problems? Yes.    Have you been able to return to your normal activities? Yes.    Do you have any questions about your discharge instructions: Diet   No. Medications  No. Follow up visit  No.  Do you have questions or concerns about your Care? No.  Actions: * If pain score is 4 or above: No action needed, pain <4.

## 2023-02-25 ENCOUNTER — Encounter: Payer: Medicare PPO | Admitting: Gastroenterology

## 2023-03-02 DIAGNOSIS — J3089 Other allergic rhinitis: Secondary | ICD-10-CM | POA: Diagnosis not present

## 2023-03-05 DIAGNOSIS — J301 Allergic rhinitis due to pollen: Secondary | ICD-10-CM | POA: Diagnosis not present

## 2023-03-08 DIAGNOSIS — J3089 Other allergic rhinitis: Secondary | ICD-10-CM | POA: Diagnosis not present

## 2023-03-18 DIAGNOSIS — J3089 Other allergic rhinitis: Secondary | ICD-10-CM | POA: Diagnosis not present

## 2023-03-23 DIAGNOSIS — T63421D Toxic effect of venom of ants, accidental (unintentional), subsequent encounter: Secondary | ICD-10-CM | POA: Diagnosis not present

## 2023-03-23 DIAGNOSIS — J3081 Allergic rhinitis due to animal (cat) (dog) hair and dander: Secondary | ICD-10-CM | POA: Diagnosis not present

## 2023-03-23 DIAGNOSIS — J301 Allergic rhinitis due to pollen: Secondary | ICD-10-CM | POA: Diagnosis not present

## 2023-03-30 DIAGNOSIS — J3089 Other allergic rhinitis: Secondary | ICD-10-CM | POA: Diagnosis not present

## 2023-03-30 DIAGNOSIS — J301 Allergic rhinitis due to pollen: Secondary | ICD-10-CM | POA: Diagnosis not present

## 2023-03-30 DIAGNOSIS — J3081 Allergic rhinitis due to animal (cat) (dog) hair and dander: Secondary | ICD-10-CM | POA: Diagnosis not present

## 2023-04-06 ENCOUNTER — Other Ambulatory Visit: Payer: Medicare PPO | Admitting: Pharmacist

## 2023-04-06 DIAGNOSIS — E118 Type 2 diabetes mellitus with unspecified complications: Secondary | ICD-10-CM

## 2023-04-06 NOTE — Patient Instructions (Signed)
 It was a pleasure speaking with you today!    Feel free to call with any questions or concerns!  Arbutus Leas, PharmD, BCPS, CPP Clinical Pharmacist Practitioner [facility] at [facility] [phone]

## 2023-04-06 NOTE — Progress Notes (Signed)
   04/06/2023 Name: Diana Reeves MRN: [id number] DOB: 07/01/1950  Chief Complaint  Patient presents with   Diabetes   Medication access    Diana Reeves is a 73 y.o. year old female who presented for a telephone visit.   They were referred to the pharmacist by their PCP for assistance in managing diabetes and hypertension.    Subjective:  Care Team: Primary Care Provider: Etta Grandchild, MD ; Next Scheduled Visit: 05/17/2023  Medication Access/Adherence  Current Pharmacy:  [facility] [id number] [place], [place] - [address] [place] [address] Phone: [phone] Fax: [phone]  [facility] [address] [place] [address] Phone: [phone] Fax: [phone]   Patient reports affordability concerns with their medications: Yes  Patient reports access/transportation concerns to their pharmacy: No  Patient reports adherence concerns with their medications:  No   *Reports Xarelto and Jardiance are expensive but currently affording them  Diabetes:  Current medications: Jardiance 25 mg daily Medications tried in the past: Ozempic  Pt not checking BG. Dexcom Rx sent but has not gotten.      Objective: BP Readings from Last 3 Encounters:  02/23/23 (!) 120/56  01/21/23 138/78  12/29/22 128/82    Lab Results  Component Value Date   HGBA1C 7.7 (H) 01/21/2023    Lab Results  Component Value Date   CREATININE 0.59 12/24/2022   BUN 15 12/24/2022   NA 139 12/24/2022   K 3.4 (L) 12/24/2022   CL 105 12/24/2022   CO2 26 12/24/2022    Lab Results  Component Value Date   CHOL 145 05/13/2022   HDL 44.90 05/13/2022   LDLCALC 86 05/13/2022   TRIG 71.0 05/13/2022   CHOLHDL 3 05/13/2022    Medications Reviewed Today   Medications were not reviewed in this encounter      Assessment/Plan:   Diabetes: - Currently uncontrolled, Goal A1c <7.5% - Reviewed goal A1c - Reviewed dietary  modifications including smaller portion sizes of carbs, increasing protein, pairing protein with carbs - Recommend to continue current regimen. Await next A1c since pt had been off of Jardiance for last A1c  - Insurance will not cover CGM since patient is not on insulin - Meets financial criteria for Jardiance patient assistance program through [facility]. Application is at the front desk waiting for patient signature - she notes she will come by tomorrow   Follow Up Plan: await Jardiance app  Arbutus Leas, PharmD, BCPS, CPP Clinical Pharmacist Practitioner [facility] at [facility] [phone]

## 2023-04-07 DIAGNOSIS — J301 Allergic rhinitis due to pollen: Secondary | ICD-10-CM | POA: Diagnosis not present

## 2023-04-07 DIAGNOSIS — J3089 Other allergic rhinitis: Secondary | ICD-10-CM | POA: Diagnosis not present

## 2023-04-07 DIAGNOSIS — J3081 Allergic rhinitis due to animal (cat) (dog) hair and dander: Secondary | ICD-10-CM | POA: Diagnosis not present

## 2023-04-13 DIAGNOSIS — J3089 Other allergic rhinitis: Secondary | ICD-10-CM | POA: Diagnosis not present

## 2023-04-13 DIAGNOSIS — J3081 Allergic rhinitis due to animal (cat) (dog) hair and dander: Secondary | ICD-10-CM | POA: Diagnosis not present

## 2023-04-15 DIAGNOSIS — M7061 Trochanteric bursitis, right hip: Secondary | ICD-10-CM | POA: Diagnosis not present

## 2023-04-23 DIAGNOSIS — J3081 Allergic rhinitis due to animal (cat) (dog) hair and dander: Secondary | ICD-10-CM | POA: Diagnosis not present

## 2023-04-23 DIAGNOSIS — J301 Allergic rhinitis due to pollen: Secondary | ICD-10-CM | POA: Diagnosis not present

## 2023-04-23 DIAGNOSIS — J3089 Other allergic rhinitis: Secondary | ICD-10-CM | POA: Diagnosis not present

## 2023-04-28 DIAGNOSIS — J301 Allergic rhinitis due to pollen: Secondary | ICD-10-CM | POA: Diagnosis not present

## 2023-04-28 DIAGNOSIS — J3089 Other allergic rhinitis: Secondary | ICD-10-CM | POA: Diagnosis not present

## 2023-04-28 DIAGNOSIS — J3081 Allergic rhinitis due to animal (cat) (dog) hair and dander: Secondary | ICD-10-CM | POA: Diagnosis not present

## 2023-05-04 DIAGNOSIS — J301 Allergic rhinitis due to pollen: Secondary | ICD-10-CM | POA: Diagnosis not present

## 2023-05-04 DIAGNOSIS — J3081 Allergic rhinitis due to animal (cat) (dog) hair and dander: Secondary | ICD-10-CM | POA: Diagnosis not present

## 2023-05-04 DIAGNOSIS — J3089 Other allergic rhinitis: Secondary | ICD-10-CM | POA: Diagnosis not present

## 2023-05-11 DIAGNOSIS — J3089 Other allergic rhinitis: Secondary | ICD-10-CM | POA: Diagnosis not present

## 2023-05-13 ENCOUNTER — Telehealth: Payer: Self-pay

## 2023-05-13 DIAGNOSIS — M1611 Unilateral primary osteoarthritis, right hip: Secondary | ICD-10-CM | POA: Diagnosis not present

## 2023-05-13 NOTE — Telephone Encounter (Signed)
 PAP: Application for Jardiance has been submitted to [employer] ([employer]), via fax   PLEASE BE ADVISE CALL COMPANY TO FOLLOW UP ON PT JARDIANCE AND PT  STILL NEED PROOF OF INCOME TO BE SUBMITTED LEFT HIPAA VOICE MESSAGE.

## 2023-05-17 ENCOUNTER — Telehealth: Payer: Self-pay

## 2023-05-17 ENCOUNTER — Encounter: Payer: Medicare PPO | Admitting: Internal Medicine

## 2023-05-17 DIAGNOSIS — J3089 Other allergic rhinitis: Secondary | ICD-10-CM | POA: Diagnosis not present

## 2023-05-17 DIAGNOSIS — J3081 Allergic rhinitis due to animal (cat) (dog) hair and dander: Secondary | ICD-10-CM | POA: Diagnosis not present

## 2023-05-17 DIAGNOSIS — J301 Allergic rhinitis due to pollen: Secondary | ICD-10-CM | POA: Diagnosis not present

## 2023-05-17 NOTE — Telephone Encounter (Signed)
 Received proof of income from [facility] today and have submitted to [facility] will follow up in a few days with company.

## 2023-05-21 DIAGNOSIS — M25551 Pain in right hip: Secondary | ICD-10-CM | POA: Diagnosis not present

## 2023-05-21 NOTE — Telephone Encounter (Signed)
 Following up with pt on PAP [employer] received a letter from company pt does not meet current eligibility criteria income higher then company eligibility,spoke with pt today and is aware pt explain will re-apply next year.

## 2023-05-24 DIAGNOSIS — J301 Allergic rhinitis due to pollen: Secondary | ICD-10-CM | POA: Diagnosis not present

## 2023-05-24 DIAGNOSIS — J3081 Allergic rhinitis due to animal (cat) (dog) hair and dander: Secondary | ICD-10-CM | POA: Diagnosis not present

## 2023-05-24 DIAGNOSIS — J3089 Other allergic rhinitis: Secondary | ICD-10-CM | POA: Diagnosis not present

## 2023-05-26 DIAGNOSIS — K219 Gastro-esophageal reflux disease without esophagitis: Secondary | ICD-10-CM | POA: Diagnosis not present

## 2023-05-26 DIAGNOSIS — R052 Subacute cough: Secondary | ICD-10-CM | POA: Diagnosis not present

## 2023-05-26 DIAGNOSIS — J3089 Other allergic rhinitis: Secondary | ICD-10-CM | POA: Diagnosis not present

## 2023-05-31 DIAGNOSIS — J3089 Other allergic rhinitis: Secondary | ICD-10-CM | POA: Diagnosis not present

## 2023-06-08 DIAGNOSIS — M1611 Unilateral primary osteoarthritis, right hip: Secondary | ICD-10-CM | POA: Diagnosis not present

## 2023-06-08 DIAGNOSIS — M25551 Pain in right hip: Secondary | ICD-10-CM | POA: Diagnosis not present

## 2023-06-15 DIAGNOSIS — J3089 Other allergic rhinitis: Secondary | ICD-10-CM | POA: Diagnosis not present

## 2023-06-15 DIAGNOSIS — J301 Allergic rhinitis due to pollen: Secondary | ICD-10-CM | POA: Diagnosis not present

## 2023-06-15 DIAGNOSIS — J3081 Allergic rhinitis due to animal (cat) (dog) hair and dander: Secondary | ICD-10-CM | POA: Diagnosis not present

## 2023-06-16 ENCOUNTER — Encounter: Payer: Self-pay | Admitting: Internal Medicine

## 2023-06-16 ENCOUNTER — Ambulatory Visit (INDEPENDENT_AMBULATORY_CARE_PROVIDER_SITE_OTHER): Admitting: Internal Medicine

## 2023-06-16 VITALS — BP 138/84 | HR 70 | Temp 97.8°F | Ht 65.0 in | Wt 183.0 lb

## 2023-06-16 DIAGNOSIS — Z0001 Encounter for general adult medical examination with abnormal findings: Secondary | ICD-10-CM

## 2023-06-16 DIAGNOSIS — Z0000 Encounter for general adult medical examination without abnormal findings: Secondary | ICD-10-CM | POA: Diagnosis not present

## 2023-06-16 DIAGNOSIS — I480 Paroxysmal atrial fibrillation: Secondary | ICD-10-CM | POA: Diagnosis not present

## 2023-06-16 DIAGNOSIS — N952 Postmenopausal atrophic vaginitis: Secondary | ICD-10-CM

## 2023-06-16 DIAGNOSIS — E785 Hyperlipidemia, unspecified: Secondary | ICD-10-CM

## 2023-06-16 DIAGNOSIS — K7581 Nonalcoholic steatohepatitis (NASH): Secondary | ICD-10-CM | POA: Diagnosis not present

## 2023-06-16 DIAGNOSIS — K219 Gastro-esophageal reflux disease without esophagitis: Secondary | ICD-10-CM

## 2023-06-16 DIAGNOSIS — F5105 Insomnia due to other mental disorder: Secondary | ICD-10-CM | POA: Diagnosis not present

## 2023-06-16 DIAGNOSIS — Z23 Encounter for immunization: Secondary | ICD-10-CM

## 2023-06-16 DIAGNOSIS — Z7984 Long term (current) use of oral hypoglycemic drugs: Secondary | ICD-10-CM

## 2023-06-16 DIAGNOSIS — F409 Phobic anxiety disorder, unspecified: Secondary | ICD-10-CM

## 2023-06-16 DIAGNOSIS — K2100 Gastro-esophageal reflux disease with esophagitis, without bleeding: Secondary | ICD-10-CM

## 2023-06-16 DIAGNOSIS — E118 Type 2 diabetes mellitus with unspecified complications: Secondary | ICD-10-CM

## 2023-06-16 DIAGNOSIS — F325 Major depressive disorder, single episode, in full remission: Secondary | ICD-10-CM

## 2023-06-16 LAB — HEPATIC FUNCTION PANEL
ALT: 22 U/L (ref 0–35)
AST: 18 U/L (ref 0–37)
Albumin: 4.3 g/dL (ref 3.5–5.2)
Alkaline Phosphatase: 66 U/L (ref 39–117)
Bilirubin, Direct: 0.1 mg/dL (ref 0.0–0.3)
Total Bilirubin: 0.5 mg/dL (ref 0.2–1.2)
Total Protein: 7.2 g/dL (ref 6.0–8.3)

## 2023-06-16 LAB — CBC WITH DIFFERENTIAL/PLATELET
Basophils Absolute: 0 10*3/uL (ref 0.0–0.1)
Basophils Relative: 0.3 % (ref 0.0–3.0)
Eosinophils Absolute: 0.2 10*3/uL (ref 0.0–0.7)
Eosinophils Relative: 2.5 % (ref 0.0–5.0)
HCT: 43.8 % (ref 36.0–46.0)
Hemoglobin: 14.3 g/dL (ref 12.0–15.0)
Lymphocytes Relative: 32.8 % (ref 12.0–46.0)
Lymphs Abs: 3 10*3/uL (ref 0.7–4.0)
MCHC: 32.6 g/dL (ref 30.0–36.0)
MCV: 79.8 fl (ref 78.0–100.0)
Monocytes Absolute: 0.6 10*3/uL (ref 0.1–1.0)
Monocytes Relative: 6.1 % (ref 3.0–12.0)
Neutro Abs: 5.3 10*3/uL (ref 1.4–7.7)
Neutrophils Relative %: 58.3 % (ref 43.0–77.0)
Platelets: 264 10*3/uL (ref 150.0–400.0)
RBC: 5.48 Mil/uL — ABNORMAL HIGH (ref 3.87–5.11)
RDW: 16.2 % — ABNORMAL HIGH (ref 11.5–15.5)
WBC: 9.1 10*3/uL (ref 4.0–10.5)

## 2023-06-16 LAB — LIPID PANEL
Cholesterol: 135 mg/dL (ref 0–200)
HDL: 54.7 mg/dL (ref >39.00)
LDL Cholesterol: 53 mg/dL (ref 0–99)
NonHDL: 79.88
Total CHOL/HDL Ratio: 2
Triglycerides: 133 mg/dL (ref 0.0–149.0)
VLDL: 26.6 mg/dL (ref 0.0–40.0)

## 2023-06-16 LAB — BASIC METABOLIC PANEL WITH GFR
BUN: 15 mg/dL (ref 6–23)
CO2: 24 meq/L (ref 19–32)
Calcium: 9.4 mg/dL (ref 8.4–10.5)
Chloride: 106 meq/L (ref 96–112)
Creatinine, Ser: 0.76 mg/dL (ref 0.40–1.20)
GFR: 76.87 mL/min (ref >60.00)
Glucose, Bld: 193 mg/dL — ABNORMAL HIGH (ref 70–99)
Potassium: 3.9 meq/L (ref 3.5–5.1)
Sodium: 140 meq/L (ref 135–145)

## 2023-06-16 LAB — URINALYSIS, ROUTINE W REFLEX MICROSCOPIC
Bilirubin Urine: NEGATIVE
Hgb urine dipstick: NEGATIVE
Ketones, ur: NEGATIVE
Leukocytes,Ua: NEGATIVE
Nitrite: NEGATIVE
Specific Gravity, Urine: 1.01 (ref 1.000–1.030)
Total Protein, Urine: NEGATIVE
Urine Glucose: >=1000 — AB
Urobilinogen, UA: 1 (ref 0.0–1.0)
pH: 6 (ref 5.0–8.0)

## 2023-06-16 LAB — PROTIME-INR
INR: 1.2 ratio — ABNORMAL HIGH (ref 0.8–1.0)
Prothrombin Time: 12.4 s (ref 9.6–13.1)

## 2023-06-16 LAB — HEMOGLOBIN A1C: Hgb A1c MFr Bld: 8.2 % — ABNORMAL HIGH (ref 4.6–6.5)

## 2023-06-16 LAB — TSH: TSH: 0.71 u[IU]/mL (ref 0.35–5.50)

## 2023-06-16 MED ORDER — BOOSTRIX 5-2.5-18.5 LF-MCG/0.5 IM SUSP: 0.5000 mL | Freq: Once | INTRAMUSCULAR | 0 refills | Status: AC

## 2023-06-16 MED ORDER — DULOXETINE HCL 60 MG PO CPEP: 60.0000 mg | ORAL_CAPSULE | Freq: Every day | ORAL | 1 refills | Status: DC

## 2023-06-16 MED ORDER — NONFORMULARY OR COMPOUNDED ITEM: 3 refills | Status: AC

## 2023-06-16 MED ORDER — METFORMIN HCL ER 750 MG PO TB24: 750.0000 mg | ORAL_TABLET | Freq: Every day | ORAL | 1 refills | Status: DC

## 2023-06-16 NOTE — Patient Instructions (Signed)

## 2023-06-16 NOTE — Progress Notes (Unsigned)
 Subjective:  Patient ID: Diana Reeves, female    DOB: 1950/03/19  Age: 73 y.o. MRN: [id number]  CC: Annual Exam, Hypertension, Hyperlipidemia, Diabetes, and Atrial Fibrillation   HPI Diana Reeves presents for a CPX and f/up ----  Discussed the use of AI scribe software for clinical note transcription with the patient, who gave verbal consent to proceed.  History of Present Illness   AVO SCHLACHTER is a 73 year old female with atrial fibrillation and hypertension who presents for a routine follow-up visit.  She feels well with no symptoms from her atrial fibrillation. No irregular heartbeats have been noted, and her last EKG was in August. She uses a Cardio mobile device at home, which has not issued any warnings. She is unsure of her next cardiologist appointment.  Her blood pressure was recorded at 138/84. She experiences frequent awakenings during the night but is able to return to sleep. She uses Ambien occasionally, particularly when she anticipates needing a full night's rest, such as before substituting at work. She does not take it often.  She has lost ten pounds by cutting back on portion sizes and reducing sugar intake, despite recent celebrations and dining out. No issues with blood sugar, and no excessive thirst, urination, or changes in weight or appetite.  She had epistaxis this morning, which she attributes to nasal irrigation due to pollen exposure from yard work. The bleeding was minor and stopped quickly.  She mentions needing a refill on her antidepressant and estrogen vaginal cream, which is compounded and obtained from a specific pharmacy.       Outpatient Medications Prior to Visit  Medication Sig Dispense Refill   Azelastine-Fluticasone 137-50 MCG/ACT SUSP Place 1 spray into both nostrils daily as needed (allergies).     clonazePAM (KLONOPIN) 0.5 MG tablet Take 1 tablet (0.5 mg total) by mouth 2 (two) times daily as needed for anxiety. 60 tablet 3   diltiazem  (CARDIZEM CD) 180 MG 24 hr capsule Take 1 capsule (180 mg total) by mouth daily. 90 capsule 3   dorzolamide-timolol (COSOPT) 22.3-6.8 MG/ML ophthalmic solution Place 1 drop into the right eye 2 (two) times daily.     empagliflozin (JARDIANCE) 25 MG TABS tablet Take 1 tablet (25 mg total) by mouth daily. 90 tablet 1   EPINEPHrine 0.3 mg/0.3 mL IJ SOAJ injection Inject 0.3 mg into the muscle as needed for anaphylaxis.     esomeprazole (NEXIUM) 40 MG capsule TAKE ONE CAPSULE BY MOUTH EVERY MORNING BEFORE BREAKFAST 90 capsule 1   glucose blood (ONETOUCH ULTRA) test strip Use as instructed 100 each 5   metroNIDAZOLE (METROGEL) 0.75 % vaginal gel Place 1 Applicatorful vaginally as needed (As needed per patient). As needed     rivaroxaban (XARELTO) 20 MG TABS tablet Take 1 tablet (20 mg total) by mouth daily with supper. 90 tablet 1   rosuvastatin (CRESTOR) 10 MG tablet Take 1 tablet (10 mg total) by mouth daily. 90 tablet 1   zolpidem (AMBIEN) 5 MG tablet Take 1 tablet (5 mg total) by mouth at bedtime as needed for sleep. 15 tablet 2   DULoxetine (CYMBALTA) 60 MG capsule Take 1 capsule (60 mg total) by mouth daily. 90 capsule 1   NONFORMULARY OR COMPOUNDED ITEM Estradiol vaginal cream 0.02% insert 1 gram vaginally twice weekly.  Dispense 24 grams/3RF 1 each 3   Blood Glucose Calibration (OT ULTRA/FASTTK CNTRL SOLN) SOLN 1 Act by In Vitro route 2 (two) times daily. (Patient not  taking: Reported on 01/21/2023) 1 each 2   Blood Glucose Monitoring Suppl (ONE TOUCH ULTRA 2) w/Device KIT 1 Act by Does not apply route 2 (two) times daily. (Patient not taking: Reported on 01/21/2023) 1 kit 1   No facility-administered medications prior to visit.    ROS Review of Systems  Objective:  BP 138/84 (BP Location: Left Arm, Patient Position: Sitting, Cuff Size: Normal)   Pulse 70   Temp 97.8 F (36.6 C) (Oral)   Ht 5\' 5"  (1.651 m)   Wt 183 lb (83 kg)   LMP 03/17/1991   SpO2 96%   BMI 30.45 kg/m   BP  Readings from Last 3 Encounters:  06/16/23 138/84  02/23/23 (!) 120/56  01/21/23 138/78    Wt Readings from Last 3 Encounters:  06/16/23 183 lb (83 kg)  02/23/23 194 lb (88 kg)  01/21/23 194 lb 9.6 oz (88.3 kg)    Physical Exam Vitals reviewed.  Constitutional:      Appearance: Normal appearance.  HENT:     Mouth/Throat:     Mouth: Mucous membranes are moist.  Eyes:     General: No scleral icterus.    Conjunctiva/sclera: Conjunctivae normal.  Cardiovascular:     Rate and Rhythm: Regular rhythm. Bradycardia present.     Heart sounds: No murmur heard.    No friction rub. No gallop.     Comments: EKG- SB, 58 bpm NS ST and T wave changes No LVH or Q waves Unchanged Pulmonary:     Effort: Pulmonary effort is normal.     Breath sounds: No stridor. No wheezing, rhonchi or rales.  Abdominal:     General: Abdomen is flat.     Palpations: There is no mass.     Tenderness: There is no abdominal tenderness. There is no guarding.     Hernia: No hernia is present.  Musculoskeletal:        General: Normal range of motion.     Cervical back: Neck supple.     Right lower leg: No edema.     Left lower leg: No edema.  Lymphadenopathy:     Cervical: No cervical adenopathy.  Skin:    General: Skin is warm and dry.  Neurological:     General: No focal deficit present.     Mental Status: She is alert.  Psychiatric:        Mood and Affect: Mood normal.        Behavior: Behavior normal.     Lab Results  Component Value Date   WBC 9.1 06/16/2023   HGB 14.3 06/16/2023   HCT 43.8 06/16/2023   PLT 264.0 06/16/2023   GLUCOSE 193 (H) 06/16/2023   CHOL 135 06/16/2023   TRIG 133.0 06/16/2023   HDL 54.70 06/16/2023   LDLCALC 53 06/16/2023   ALT 22 06/16/2023   AST 18 06/16/2023   NA 140 06/16/2023   K 3.9 06/16/2023   CL 106 06/16/2023   CREATININE 0.76 06/16/2023   BUN 15 06/16/2023   CO2 24 06/16/2023   TSH 0.71 06/16/2023   INR 1.2 (H) 06/16/2023   HGBA1C 8.2 (H)  06/16/2023   MICROALBUR 1.3 01/21/2023    US Abdomen Limited RUQ (LIVER/GB) Result Date: 07/06/2022 CLINICAL DATA:  Gallbladder polyp. EXAM: ULTRASOUND ABDOMEN LIMITED RIGHT UPPER QUADRANT COMPARISON:  Ultrasound right upper quadrant 07/07/2021 FINDINGS: Gallbladder: Similar-appearing 4-5 mm gallbladder polyp. No gallbladder wall thickening or pericholecystic fluid. Negative sonographic Murphy's sign. Common bile duct: Diameter: 3.5 mm Liver:  Mildly increased echogenicity. No focal lesion. Portal vein is patent on color Doppler imaging with normal direction of blood flow towards the liver. Other: None. IMPRESSION: 1. Similar-appearing 4-5 mm gallbladder polyp. Given the possible mild interval change appearance, follow-up right upper quadrant ultrasound is recommended in 12 months. 2. Mildly increased hepatic parenchymal echogenicity suggestive of steatosis. Electronically Signed   By: Annia Belt M.D.   On: 07/06/2022 09:48    Assessment & Plan:  Gastroesophageal reflux disease without esophagitis -     CBC with Differential/Platelet; Future  Type II diabetes mellitus with manifestations (HCC) -     Urinalysis, Routine w reflex microscopic; Future -     Hemoglobin A1c; Future -     Basic metabolic panel with GFR; Future -     metFORMIN HCl ER; Take 1 tablet (750 mg total) by mouth daily with breakfast.  Dispense: 90 tablet; Refill: 1 -     AMB Referral VBCI Care Management -     HM Diabetes Foot Exam  Nonalcoholic steatohepatitis (NASH) -     Hepatic function panel; Future -     Protime-INR; Future  Gastroesophageal reflux disease with esophagitis without hemorrhage -     CBC with Differential/Platelet; Future  PAF (paroxysmal atrial fibrillation) (HCC) -     TSH; Future -     Basic metabolic panel with GFR; Future  Encounter for general adult medical examination with abnormal findings  Insomnia due to anxiety and fear -     TSH; Future  Hyperlipidemia with target LDL less than  100 -     Lipid panel; Future -     TSH; Future  Major depressive disorder with single episode, in full remission (HCC) -     DULoxetine HCl; Take 1 capsule (60 mg total) by mouth daily.  Dispense: 90 capsule; Refill: 1  Vaginal atrophy -     NONFORMULARY OR COMPOUNDED ITEM; Estradiol vaginal cream 0.02% insert 1 gram vaginally twice weekly.  Dispense 24 grams/3RF  Dispense: 1 each; Refill: 3  Need for prophylactic vaccination with combined diphtheria-tetanus-pertussis (DTP) vaccine -     Boostrix; Inject 0.5 mLs into the muscle once for 1 dose.  Dispense: 0.5 mL; Refill: 0     Follow-up: Return in about 6 months (around 12/16/2023).  Sanda Linger, MD

## 2023-06-17 MED ORDER — ZOLPIDEM TARTRATE 5 MG PO TABS: 5.0000 mg | ORAL_TABLET | Freq: Every evening | ORAL | 2 refills | Status: DC | PRN

## 2023-06-17 NOTE — Addendum Note (Signed)
 Addended by: Daryll Brod on: 06/17/2023 04:25 PM   Modules accepted: Orders

## 2023-06-18 ENCOUNTER — Telehealth: Payer: Self-pay | Admitting: *Deleted

## 2023-06-18 NOTE — Progress Notes (Signed)
 Care Guide Pharmacy Note  06/18/2023 Name: Diana Reeves MRN: [id number] DOB: 13-Aug-1950  Referred By: Etta Grandchild, MD Reason for referral: Care Coordination (Outreach to schedule referral with pharmacist )   Diana Reeves is a 73 y.o. year old female who is a primary care patient of Etta Grandchild, MD.  Colen Darling was referred to the pharmacist for assistance related to: DMII  An unsuccessful telephone outreach was attempted today to contact the patient who was referred to the pharmacy team for assistance with medication management. Additional attempts will be made to contact the patient.  Burman Nieves, CMA, [place]  [facility], [facility] Guide Direct Dial: [phone]  Fax: [phone] Website: [email or web address]

## 2023-06-21 DIAGNOSIS — J301 Allergic rhinitis due to pollen: Secondary | ICD-10-CM | POA: Diagnosis not present

## 2023-06-21 DIAGNOSIS — J3089 Other allergic rhinitis: Secondary | ICD-10-CM | POA: Diagnosis not present

## 2023-06-21 DIAGNOSIS — J3081 Allergic rhinitis due to animal (cat) (dog) hair and dander: Secondary | ICD-10-CM | POA: Diagnosis not present

## 2023-06-21 NOTE — Progress Notes (Unsigned)
 Care Guide Pharmacy Note  06/21/2023 Name: CRYSTALLE POPWELL MRN: [id number] DOB: December 31, 1950  Referred By: Etta Grandchild, MD Reason for referral: Care Coordination (Outreach to schedule referral with pharmacist )   DENEANE STIFTER is a 73 y.o. year old female who is a primary care patient of Etta Grandchild, MD.  Colen Darling was referred to the pharmacist for assistance related to: DMII  A second unsuccessful telephone outreach was attempted today to contact the patient who was referred to the pharmacy team for assistance with medication management. Additional attempts will be made to contact the patient.  Burman Nieves, CMA [place]  [facility], [facility] Guide Direct Dial: [phone]  Fax: [phone] Website: [email or web address]

## 2023-06-22 NOTE — Progress Notes (Signed)
 Care Guide Pharmacy Note  06/22/2023 Name: Diana Reeves MRN: [id number] DOB: 05-18-1950  Referred By: Etta Grandchild, MD Reason for referral: Care Coordination (Outreach to schedule referral with pharmacist )   Diana Reeves is a 73 y.o. year old female who is a primary care patient of Etta Grandchild, MD.  Colen Darling was referred to the pharmacist for assistance related to: DMII  A third unsuccessful telephone outreach was attempted today to contact the patient who was referred to the pharmacy team for assistance with medication management. The Population Health team is pleased to engage with this patient at any time in the future upon receipt of referral and should he/she be interested in assistance from the Population Health team.  Burman Nieves, CMA [facility]  [facility], [facility] Guide Direct Dial: [phone]  Fax: [phone] Website: [email or web address]

## 2023-06-28 DIAGNOSIS — J3089 Other allergic rhinitis: Secondary | ICD-10-CM | POA: Diagnosis not present

## 2023-07-06 DIAGNOSIS — M1611 Unilateral primary osteoarthritis, right hip: Secondary | ICD-10-CM | POA: Diagnosis not present

## 2023-07-08 DIAGNOSIS — J3089 Other allergic rhinitis: Secondary | ICD-10-CM | POA: Diagnosis not present

## 2023-07-13 ENCOUNTER — Other Ambulatory Visit: Payer: Self-pay

## 2023-07-13 DIAGNOSIS — K824 Cholesterolosis of gallbladder: Secondary | ICD-10-CM

## 2023-07-13 DIAGNOSIS — H401423 Capsular glaucoma with pseudoexfoliation of lens, left eye, severe stage: Secondary | ICD-10-CM | POA: Diagnosis not present

## 2023-07-13 DIAGNOSIS — H401412 Capsular glaucoma with pseudoexfoliation of lens, right eye, moderate stage: Secondary | ICD-10-CM | POA: Diagnosis not present

## 2023-07-16 DIAGNOSIS — J3089 Other allergic rhinitis: Secondary | ICD-10-CM | POA: Diagnosis not present

## 2023-07-20 ENCOUNTER — Other Ambulatory Visit: Payer: Self-pay | Admitting: Internal Medicine

## 2023-07-20 DIAGNOSIS — E118 Type 2 diabetes mellitus with unspecified complications: Secondary | ICD-10-CM

## 2023-07-20 DIAGNOSIS — K2100 Gastro-esophageal reflux disease with esophagitis, without bleeding: Secondary | ICD-10-CM

## 2023-07-20 DIAGNOSIS — E785 Hyperlipidemia, unspecified: Secondary | ICD-10-CM

## 2023-07-22 NOTE — Progress Notes (Signed)
 Care Guide Pharmacy Note  07/22/2023 Name: DERIYAH PESSIN MRN: [id number] DOB: [birth year]  Referred By: Arcadio Knuckles, MD Reason for referral: Care Coordination (Outreach to schedule referral with pharmacist )   SAKIRA DELZELL is a 73 y.o. year old female who is a primary care patient of Arcadio Knuckles, MD.  JYOTI ZUPON was referred to the pharmacist for assistance related to: DMII  Successful contact was made with the patient to discuss pharmacy services including being ready for the pharmacist to call at least 5 minutes before the scheduled appointment time and to have medication bottles and any blood pressure readings ready for review. The patient agreed to meet with the pharmacist via telephone visit on 08/03/2023  Kandis Ormond, CMA [place]  [facility], [facility] Guide Direct Dial: [phone]  Fax: [phone] Website: [email or web address]

## 2023-07-24 ENCOUNTER — Ambulatory Visit (HOSPITAL_COMMUNITY)

## 2023-07-26 DIAGNOSIS — J301 Allergic rhinitis due to pollen: Secondary | ICD-10-CM | POA: Diagnosis not present

## 2023-07-26 DIAGNOSIS — J3081 Allergic rhinitis due to animal (cat) (dog) hair and dander: Secondary | ICD-10-CM | POA: Diagnosis not present

## 2023-07-26 DIAGNOSIS — J3089 Other allergic rhinitis: Secondary | ICD-10-CM | POA: Diagnosis not present

## 2023-08-02 ENCOUNTER — Ambulatory Visit (HOSPITAL_COMMUNITY)
Admission: RE | Admit: 2023-08-02 | Discharge: 2023-08-02 | Disposition: A | Discharge status: Home / Self Care | Source: Ambulatory Visit | Attending: Gastroenterology | Admitting: Gastroenterology

## 2023-08-02 DIAGNOSIS — J3081 Allergic rhinitis due to animal (cat) (dog) hair and dander: Secondary | ICD-10-CM | POA: Diagnosis not present

## 2023-08-02 DIAGNOSIS — K824 Cholesterolosis of gallbladder: Secondary | ICD-10-CM | POA: Diagnosis not present

## 2023-08-02 DIAGNOSIS — J3089 Other allergic rhinitis: Secondary | ICD-10-CM | POA: Diagnosis not present

## 2023-08-02 DIAGNOSIS — K760 Fatty (change of) liver, not elsewhere classified: Secondary | ICD-10-CM | POA: Diagnosis not present

## 2023-08-02 DIAGNOSIS — J301 Allergic rhinitis due to pollen: Secondary | ICD-10-CM | POA: Diagnosis not present

## 2023-08-03 ENCOUNTER — Other Ambulatory Visit (INDEPENDENT_AMBULATORY_CARE_PROVIDER_SITE_OTHER): Admitting: Pharmacist

## 2023-08-03 DIAGNOSIS — E118 Type 2 diabetes mellitus with unspecified complications: Secondary | ICD-10-CM

## 2023-08-03 NOTE — Patient Instructions (Signed)
 It was a pleasure speaking with you today!  Continue jardiance  and metformin  for blood sugars. Work on taking metformin  consistently and work on diet/exercise to help lower blood sugars.  Come 7/2 or 7/3 for walk-in lab to get you A1c checked.  Feel free to call with any questions or concerns!  Rainelle Bur, PharmD, BCPS, CPP Clinical Pharmacist Practitioner  Primary Care at [facility] [phone]

## 2023-08-03 NOTE — Progress Notes (Signed)
 08/03/2023 Name: Diana Reeves MRN: [id number] DOB: September 08, 1950  Chief Complaint  Patient presents with   Diabetes   Medication Management    Diana Reeves is a 73 y.o. year old female who presented for a telephone visit.   They were referred to the pharmacist by their PCP for assistance in managing diabetes and hypertension.    Subjective:  Care Team: Primary Care Provider: Arcadio Knuckles, MD ; Next Scheduled Visit: 05/17/2023  Medication Access/Adherence  Current Pharmacy:  [facility] [id number] [place], [place] - [address] [place] [address] Phone: [phone] Fax: [phone]  [facility] [address] [place] [address] Phone: [phone] Fax: [phone]   Patient reports affordability concerns with their medications: Yes  Patient reports access/transportation concerns to their pharmacy: No  Patient reports adherence concerns with their medications:  No    Diabetes:  Current medications: Jardiance  25 mg daily, Metformin  XR 750 mg daily Medications tried in the past: Ozempic  She started metformin  in April, reports no issues tolerating it although forgets to take at times  Pt not checking BG.      Objective: BP Readings from Last 3 Encounters:  06/16/23 138/84  02/23/23 (!) 120/56  01/21/23 138/78    Lab Results  Component Value Date   HGBA1C 8.2 (H) 06/16/2023    Lab Results  Component Value Date   CREATININE 0.76 06/16/2023   BUN 15 06/16/2023   NA 140 06/16/2023   K 3.9 06/16/2023   CL 106 06/16/2023   CO2 24 06/16/2023    Lab Results  Component Value Date   CHOL 135 06/16/2023   HDL 54.70 06/16/2023   LDLCALC 53 06/16/2023   TRIG 133.0 06/16/2023   CHOLHDL 2 06/16/2023    Medications Reviewed Today     Reviewed by Dion Frankel, RPH (Pharmacist) on 08/03/23 at 1137  Med List Status: <None>   Medication Order Taking? Sig Documenting Provider  Last Dose Status Informant  Azelastine -Fluticasone  137-50 MCG/ACT SUSP [id number]  Place 1 spray into both nostrils daily as needed (allergies). [provider]  Active Self           Med Note Vivian Groom, MELISSA R   Tue Apr 15, 2021  1:38 PM) [place]    Blood Glucose Calibration (OT ULTRA/FASTTK CNTRL SOLN) SOLN [id number]  1 Act by In Vitro route 2 (two) times daily.  Patient not taking: Reported on 01/21/2023   Arcadio Knuckles, MD  Active Self  Blood Glucose Monitoring Suppl (ONE TOUCH ULTRA 2) w/Device KIT [id number]  1 Act by Does not apply route 2 (two) times daily.  Patient not taking: Reported on 01/21/2023   Arcadio Knuckles, MD  Active Self  clonazePAM  (KLONOPIN ) 0.5 MG tablet [id number]  Take 1 tablet (0.5 mg total) by mouth 2 (two) times daily as needed for anxiety. Arcadio Knuckles, MD  Expired 07/06/23 2359   diltiazem  (CARDIZEM  CD) 180 MG 24 hr capsule [id number] Yes Take 1 capsule (180 mg total) by mouth daily. Boyce Byes, MD Taking Active   dorzolamide-timolol (COSOPT) 22.3-6.8 MG/ML ophthalmic solution [id number]  Place 1 drop into the right eye 2 (two) times daily. [provider]  Active Self  DULoxetine  (CYMBALTA ) 60 MG capsule [id number]  Take 1 capsule (60 mg total) by mouth daily. Arcadio Knuckles, MD  Active   EPINEPHrine 0.3 mg/0.3 mL IJ SOAJ injection [id number]  Inject 0.3 mg into  the muscle as needed for anaphylaxis. [provider]  Active Self           Med Note Omie Bickers Jan 22, 2021 10:49 AM)    esomeprazole  (NEXIUM ) 40 MG capsule [id number]  TAKE ONE CAPSULE BY MOUTH EVERY MORNING BEFORE Emelie Hanger Arcadio Knuckles, MD  Active   glucose blood (ONETOUCH ULTRA) test strip [id number]  Use as instructed Arcadio Knuckles, MD  Active Self  JARDIANCE  25 MG TABS tablet [id number] Yes TAKE 1 TABLET BY MOUTH DAILY Arcadio Knuckles, MD Taking Active   metFORMIN  (GLUCOPHAGE -XR) 750 MG 24 hr tablet [id number] Yes Take 1 tablet (750 mg total) by  mouth daily with breakfast. Arcadio Knuckles, MD Taking Active   metroNIDAZOLE  (METROGEL ) 0.75 % vaginal gel [id number]  Place 1 Applicatorful vaginally as needed (As needed per patient). As needed [provider]  Active   NONFORMULARY OR COMPOUNDED ITEM [id number]  Estradiol  vaginal cream 0.02% insert 1 gram vaginally twice weekly.  Dispense 24 grams/3RF Arcadio Knuckles, MD  Active   rivaroxaban  (XARELTO ) 20 MG TABS tablet [id number]  Take 1 tablet (20 mg total) by mouth daily with supper. Croitoru, Mihai, MD  Active   rosuvastatin  (CRESTOR ) 10 MG tablet [id number]  TAKE 1 TABLET BY MOUTH DAILY Arcadio Knuckles, MD  Active   zolpidem  (AMBIEN ) 5 MG tablet [id number]  Take 1 tablet (5 mg total) by mouth at bedtime as needed for sleep. Arcadio Knuckles, MD  Active              Assessment/Plan:   Diabetes: - Currently uncontrolled, Goal A1c <7.5% - Reviewed goal A1c - Reviewed dietary modifications including smaller portion sizes of carbs, increasing protein, pairing protein with carbs - Recommend to continue current regimen. Check A1c 7/2 or 7/3   Follow Up Plan: 7/2 walk in lab  Rainelle Bur, PharmD, BCPS, CPP Clinical Pharmacist Practitioner [place] [facility] [phone]

## 2023-08-10 DIAGNOSIS — J3089 Other allergic rhinitis: Secondary | ICD-10-CM | POA: Diagnosis not present

## 2023-08-17 DIAGNOSIS — J3089 Other allergic rhinitis: Secondary | ICD-10-CM | POA: Diagnosis not present

## 2023-08-31 DIAGNOSIS — L719 Rosacea, unspecified: Secondary | ICD-10-CM | POA: Diagnosis not present

## 2023-08-31 DIAGNOSIS — D239 Other benign neoplasm of skin, unspecified: Secondary | ICD-10-CM | POA: Diagnosis not present

## 2023-08-31 DIAGNOSIS — L820 Inflamed seborrheic keratosis: Secondary | ICD-10-CM | POA: Diagnosis not present

## 2023-09-01 ENCOUNTER — Ambulatory Visit: Payer: Self-pay | Admitting: Gastroenterology

## 2023-09-01 ENCOUNTER — Telehealth: Payer: Self-pay | Admitting: Gastroenterology

## 2023-09-01 NOTE — Telephone Encounter (Signed)
Patient returning phone call regarding results

## 2023-09-01 NOTE — Telephone Encounter (Signed)
 See results note.

## 2023-09-03 NOTE — Telephone Encounter (Signed)
 Patient returned call. Discussed results. Patient verbalized understanding. Advised patient we will reach out in 1 year to repeat u/s. All questions answered.

## 2023-09-16 ENCOUNTER — Ambulatory Visit: Payer: Self-pay | Admitting: Internal Medicine

## 2023-09-16 ENCOUNTER — Other Ambulatory Visit (INDEPENDENT_AMBULATORY_CARE_PROVIDER_SITE_OTHER)

## 2023-09-16 DIAGNOSIS — E118 Type 2 diabetes mellitus with unspecified complications: Secondary | ICD-10-CM | POA: Diagnosis not present

## 2023-09-16 LAB — HEMOGLOBIN A1C: Hgb A1c MFr Bld: 7.8 % — ABNORMAL HIGH (ref 4.6–6.5)

## 2023-09-20 DIAGNOSIS — J301 Allergic rhinitis due to pollen: Secondary | ICD-10-CM | POA: Diagnosis not present

## 2023-09-20 DIAGNOSIS — J3089 Other allergic rhinitis: Secondary | ICD-10-CM | POA: Diagnosis not present

## 2023-09-20 DIAGNOSIS — J3081 Allergic rhinitis due to animal (cat) (dog) hair and dander: Secondary | ICD-10-CM | POA: Diagnosis not present

## 2023-09-28 DIAGNOSIS — J3089 Other allergic rhinitis: Secondary | ICD-10-CM | POA: Diagnosis not present

## 2023-10-01 ENCOUNTER — Other Ambulatory Visit: Payer: Self-pay | Admitting: *Deleted

## 2023-10-04 DIAGNOSIS — J3081 Allergic rhinitis due to animal (cat) (dog) hair and dander: Secondary | ICD-10-CM | POA: Diagnosis not present

## 2023-10-04 DIAGNOSIS — J3089 Other allergic rhinitis: Secondary | ICD-10-CM | POA: Diagnosis not present

## 2023-10-04 DIAGNOSIS — J301 Allergic rhinitis due to pollen: Secondary | ICD-10-CM | POA: Diagnosis not present

## 2023-10-06 DIAGNOSIS — K219 Gastro-esophageal reflux disease without esophagitis: Secondary | ICD-10-CM | POA: Diagnosis not present

## 2023-10-06 DIAGNOSIS — J3089 Other allergic rhinitis: Secondary | ICD-10-CM | POA: Diagnosis not present

## 2023-10-06 DIAGNOSIS — R052 Subacute cough: Secondary | ICD-10-CM | POA: Diagnosis not present

## 2023-10-06 DIAGNOSIS — J069 Acute upper respiratory infection, unspecified: Secondary | ICD-10-CM | POA: Diagnosis not present

## 2023-10-11 DIAGNOSIS — J3089 Other allergic rhinitis: Secondary | ICD-10-CM | POA: Diagnosis not present

## 2023-10-11 DIAGNOSIS — J3081 Allergic rhinitis due to animal (cat) (dog) hair and dander: Secondary | ICD-10-CM | POA: Diagnosis not present

## 2023-10-11 DIAGNOSIS — J301 Allergic rhinitis due to pollen: Secondary | ICD-10-CM | POA: Diagnosis not present

## 2023-10-25 DIAGNOSIS — J3081 Allergic rhinitis due to animal (cat) (dog) hair and dander: Secondary | ICD-10-CM | POA: Diagnosis not present

## 2023-10-25 DIAGNOSIS — J301 Allergic rhinitis due to pollen: Secondary | ICD-10-CM | POA: Diagnosis not present

## 2023-10-25 DIAGNOSIS — J3089 Other allergic rhinitis: Secondary | ICD-10-CM | POA: Diagnosis not present

## 2023-11-01 ENCOUNTER — Ambulatory Visit: Payer: Medicare PPO

## 2023-11-01 VITALS — Ht 65.0 in | Wt 183.0 lb

## 2023-11-01 DIAGNOSIS — J301 Allergic rhinitis due to pollen: Secondary | ICD-10-CM | POA: Diagnosis not present

## 2023-11-01 DIAGNOSIS — Z0000 Encounter for general adult medical examination without abnormal findings: Secondary | ICD-10-CM

## 2023-11-01 DIAGNOSIS — J3081 Allergic rhinitis due to animal (cat) (dog) hair and dander: Secondary | ICD-10-CM | POA: Diagnosis not present

## 2023-11-01 DIAGNOSIS — E118 Type 2 diabetes mellitus with unspecified complications: Secondary | ICD-10-CM

## 2023-11-01 DIAGNOSIS — J3089 Other allergic rhinitis: Secondary | ICD-10-CM | POA: Diagnosis not present

## 2023-11-01 NOTE — Patient Instructions (Signed)
 Diana Reeves , Thank you for taking time out of your busy schedule to complete your Annual Wellness Visit with me. I enjoyed our conversation and look forward to speaking with you again next year. I, as well as your care team,  appreciate your ongoing commitment to your health goals. Please review the following plan we discussed and let me know if I can assist you in the future. Your Game plan/ To Do List    Follow up Visits: We will see or speak with you next year for your Next Medicare AWV with our clinical staff Have you seen your provider in the last 6 months (3 months if uncontrolled diabetes)? Yes.  Last OV on 06/16/2023.  Clinician Recommendations:  Aim for 30 minutes of exercise or brisk walking, 6-8 glasses of water, and 5 servings of fruits and vegetables each day. You are due for a tetanus vaccine and can get that done at your local pharmacy.  Remember to let your GYN know that you are due for a bone density screening.        This is a list of the screenings recommended for you:  Health Maintenance  Topic Date Due   Yearly kidney health urinalysis for diabetes  Never done   DTaP/Tdap/Td vaccine (2 - Td or Tdap) 10/07/2020   COVID-19 Vaccine (6 - 2024-25 season) 11/15/2022   Medicare Annual Wellness Visit  10/28/2023   Flu Shot  10/15/2023   Mammogram  11/30/2023   Eye exam for diabetics  01/08/2024   Hemoglobin A1C  03/18/2024   Yearly kidney function blood test for diabetes  06/15/2024   Complete foot exam   06/15/2024   Pneumococcal Vaccine for age over 64  Completed   DEXA scan (bone density measurement)  Completed   Hepatitis C Screening  Completed   Zoster (Shingles) Vaccine  Completed   HPV Vaccine  Aged Out   Meningitis B Vaccine  Aged Out   Colon Cancer Screening  Discontinued    Advanced directives: (Copy Requested) Please bring a copy of your health care power of attorney and living will to the office to be added to your chart at your convenience. You can mail to  [facility] [address] [place], [place] [address] or email to [email or web address] Advance Care Planning is important because it:  [x]  Makes sure you receive the medical care that is consistent with your values, goals, and preferences  [x]  It provides guidance to your family and loved ones and reduces their decisional burden about whether or not they are making the right decisions based on your wishes.  Follow the link provided in your after visit summary or read over the paperwork we have mailed to you to help you started getting your Advance Directives in place. If you need assistance in completing these, please reach out to us  so that we can help you!  See attachments for Preventive Care and Fall Prevention Tips.

## 2023-11-01 NOTE — Progress Notes (Signed)
 Subjective:   Diana Reeves is a 73 y.o. who presents for a Medicare Wellness preventive visit.  As a reminder, Annual Wellness Visits don't include a physical exam, and some assessments may be limited, especially if this visit is performed virtually. We may recommend an in-person follow-up visit with your provider if needed.  Visit Complete: Virtual I connected with  Diana Reeves on 11/01/23 by a audio enabled telemedicine application and verified that I am speaking with the correct person using two identifiers.  Patient Location: Home  Provider Location: Home Office  I discussed the limitations of evaluation and management by telemedicine. The patient expressed understanding and agreed to proceed.  Vital Signs: Because this visit was a virtual/telehealth visit, some criteria may be missing or patient reported. Any vitals not documented were not able to be obtained and vitals that have been documented are patient reported.  VideoDeclined- This patient declined Librarian, academic. Therefore the visit was completed with audio only.  Persons Participating in Visit: Patient.  AWV Questionnaire: Yes: Patient Medicare AWV questionnaire was completed by the patient on 10/31/2023; I have confirmed that all information answered by patient is correct and no changes since this date.  Cardiac Risk Factors include: advanced age (>62men, >42 women);hypertension;Other (see comment);diabetes mellitus;dyslipidemia, Risk factor comments: PAF     Objective:    Today's Vitals   11/01/23 0857  Weight: 183 lb (83 kg)  Height: 5' 5 (1.651 m)   Body mass index is 30.45 kg/m.     11/01/2023    9:05 AM 10/28/2022   10:03 AM 09/26/2021    1:13 PM 04/21/2021    9:27 AM 01/27/2021    8:52 AM 11/28/2020    7:50 AM 06/12/2020    1:52 PM  Advanced Directives  Does Patient Have a Medical Advance Directive? Yes Yes Yes Yes Yes Yes Yes  Type of Estate agent  of [place];Living will Healthcare Power of [place];Living will Healthcare Power of [place];Living will Healthcare Power of [place];Living will  Healthcare Power of [place];Living will Living will;Healthcare Power of Attorney  Does patient want to make changes to medical advance directive?    No - Patient declined   No - Patient declined  Copy of Healthcare Power of Attorney in Chart? No - copy requested No - copy requested No - copy requested No - copy requested  Yes - validated most recent copy scanned in chart (See row information) No - copy requested    Current Medications (verified) Outpatient Encounter Medications as of 11/01/2023  Medication Sig   Azelastine -Fluticasone  137-50 MCG/ACT SUSP Place 1 spray into both nostrils daily as needed (allergies).   clonazePAM  (KLONOPIN ) 0.5 MG tablet Take 1 tablet (0.5 mg total) by mouth 2 (two) times daily as needed for anxiety.   diltiazem  (CARDIZEM  CD) 180 MG 24 hr capsule Take 1 capsule (180 mg total) by mouth daily.   dorzolamide-timolol (COSOPT) 22.3-6.8 MG/ML ophthalmic solution Place 1 drop into the right eye 2 (two) times daily.   DULoxetine  (CYMBALTA ) 60 MG capsule Take 1 capsule (60 mg total) by mouth daily.   EPINEPHrine 0.3 mg/0.3 mL IJ SOAJ injection Inject 0.3 mg into the muscle as needed for anaphylaxis.   esomeprazole  (NEXIUM ) 40 MG capsule TAKE ONE CAPSULE BY MOUTH EVERY MORNING BEFORE BREAKFAST   JARDIANCE  25 MG TABS tablet TAKE 1 TABLET BY MOUTH DAILY   metFORMIN  (GLUCOPHAGE -XR) 750 MG 24 hr tablet Take 1 tablet (750 mg total) by mouth daily  with breakfast.   NONFORMULARY OR COMPOUNDED ITEM Estradiol  vaginal cream 0.02% insert 1 gram vaginally twice weekly.  Dispense 24 grams/3RF   rivaroxaban  (XARELTO ) 20 MG TABS tablet Take 1 tablet (20 mg total) by mouth daily with supper.   rosuvastatin  (CRESTOR ) 10 MG tablet TAKE 1 TABLET BY MOUTH DAILY   zolpidem  (AMBIEN ) 5 MG tablet Take 1 tablet (5 mg total) by mouth at bedtime as needed  for sleep.   Blood Glucose Calibration (OT ULTRA/FASTTK CNTRL SOLN) SOLN 1 Act by In Vitro route 2 (two) times daily. (Patient not taking: Reported on 01/21/2023)   Blood Glucose Monitoring Suppl (ONE TOUCH ULTRA 2) w/Device KIT 1 Act by Does not apply route 2 (two) times daily. (Patient not taking: Reported on 01/21/2023)   glucose blood (ONETOUCH ULTRA) test strip Use as instructed   metroNIDAZOLE  (METROGEL ) 0.75 % vaginal gel Place 1 Applicatorful vaginally as needed (As needed per patient). As needed   No facility-administered encounter medications on file as of 11/01/2023.    Allergies (verified) Ozempic (0.25 or 0.5 mg-dose) [semaglutide(0.25 or 0.5mg -dos)], Dust mite extract, and Mold extract [trichophyton mentagrophyte]   History: Past Medical History:  Diagnosis Date   A-fib (HCC)    Allergic state 12/26/2016   Allergy 1980   Anxiety 2012   Arthritis    Basal cell carcinoma    Benign neoplasm of colon    Degenerative cervical disc    Depression    Diabetes mellitus without complication (HCC) ?   Diverticulosis of colon (without mention of hemorrhage)    Esophageal reflux    Glaucoma    Osteoporosis, unspecified 12/2018   2007 T score -3.0, 2012 T score -1.9, 2015 T score -2.1 overall stable, 2018 T score -2.0, 2020 T score -1.8   Other chronic nonalcoholic liver disease    Sinusitis 12/26/2016   Stricture and stenosis of esophagus    Past Surgical History:  Procedure Laterality Date   ABDOMINAL HYSTERECTOMY  1993   TAH-LSO-Post. repair-[place]urch   ATRIAL FIBRILLATION ABLATION N/A 04/21/2021   Procedure: ATRIAL FIBRILLATION ABLATION;  Surgeon: Cindie Ole DASEN, MD;  Location: [facility] INVASIVE CV LAB;  Service: Cardiovascular;  Laterality: N/A;   CARDIOVERSION N/A 11/28/2020   Procedure: CARDIOVERSION;  Surgeon: Jeffrie Oneil BROCKS, MD;  Location: [facility] ENDOSCOPY;  Service: Cardiovascular;  Laterality: N/A;   CARDIOVERSION N/A 01/27/2021   Procedure: CARDIOVERSION;  Surgeon: Okey Vina GAILS, MD;  Location: [facility] ENDOSCOPY;  Service: Cardiovascular;  Laterality: N/A;   COLONOSCOPY     EYE SURGERY  10/18/2012   lt eye   FRACTURE SURGERY     knee arthroscop Right 2018   left arm surgery  09/2007   POLYPECTOMY     TUBAL LIGATION  1985   Family History  Problem Relation Age of Onset   Heart disease Mother    Cancer Father        colon   Hypertension Father    Colon cancer Father 26   Cancer Maternal Grandmother        uterine   Diabetes Paternal Grandfather    Cancer Son        Testicular cancer   Heart attack Son    Stomach cancer Neg Hx    Rectal cancer Neg Hx    Social History   Socioeconomic History   Marital status: Divorced    Spouse name: Not on file   Number of children: 3   Years of education: Not on file   Highest education  level: Bachelor's degree (e.g., BA, AB, BS)  Occupational History   Occupation: retired  Tobacco Use   Smoking status: Former    Current packs/day: 0.00    Average packs/day: 0.3 packs/day for 5.0 years (1.3 ttl pk-yrs)    Types: Cigarettes    Quit date: 07/14/1973    Years since quitting: 50.3   Smokeless tobacco: Never   Tobacco comments:    In college- 50 yeas ago-  Vaping Use   Vaping status: Never Used  Substance and Sexual Activity   Alcohol use: Not Currently    Alcohol/week: 1.0 standard drink of alcohol    Types: 1 Standard drinks or equivalent per week   Drug use: No   Sexual activity: Not Currently    Birth control/protection: Post-menopausal, Surgical    Comment: 1st intercourse 73 yo,,more than 5 partner,, hysterectomy,,no std hx,,DES negative  Other Topics Concern   Not on file  Social History Narrative   Lives alone/2025 and has 1 cat   Social Drivers of Corporate investment banker Strain: Low Risk  (10/31/2023)   Overall Financial Resource Strain (CARDIA)    Difficulty of Paying Living Expenses: Not hard at all  Food Insecurity: No Food Insecurity (10/31/2023)   Hunger Vital Sign    Worried  About Running Out of Food in the Last Year: Never true    Ran Out of Food in the Last Year: Never true  Transportation Needs: No Transportation Needs (10/31/2023)   PRAPARE - Administrator, Civil Service (Medical): No    Lack of Transportation (Non-Medical): No  Physical Activity: Insufficiently Active (10/31/2023)   Exercise Vital Sign    Days of Exercise per Week: 3 days    Minutes of Exercise per Session: 40 min  Stress: No Stress Concern Present (10/31/2023)   [employer] of Occupational Health - Occupational Stress Questionnaire    Feeling of Stress: Only a little  Social Connections: Moderately Integrated (10/31/2023)   Social Connection and Isolation Panel    Frequency of Communication with Friends and Family: More than three times a week    Frequency of Social Gatherings with Friends and Family: Twice a week    Attends Religious Services: More than 4 times per year    Active Member of [employer] or Organizations: Yes    Attends Engineer, structural: More than 4 times per year    Marital Status: Divorced    Tobacco Counseling Counseling given: Not Answered Tobacco comments: In college- 50 yeas ago-    Clinical Intake:  Pre-visit preparation completed: Yes  Pain : No/denies pain     BMI - recorded: 30.45 Nutritional Status: BMI > 30  Obese Nutritional Risks: None Diabetes: Yes CBG done?: No Did pt. bring in CBG monitor from home?: No  Lab Results  Component Value Date   HGBA1C 7.8 (H) 09/16/2023   HGBA1C 8.2 (H) 06/16/2023   HGBA1C 7.7 (H) 01/21/2023     How often do you need to have someone help you when you read instructions, pamphlets, or other written materials from your doctor or pharmacy?: 1 - Never  Interpreter Needed?: No  Information entered by :: Lusine Corlett, RMA   Activities of Daily Living     10/31/2023    3:54 PM  In your present state of health, do you have any difficulty performing the following activities:   Hearing? 0  Vision? 0  Difficulty concentrating or making decisions? 0  Walking or climbing stairs? 0  Dressing or bathing? 0  Doing errands, shopping? 0  Preparing Food and eating ? N  Using the Toilet? N  In the past six months, have you accidently leaked urine? Y  Do you have problems with loss of bowel control? N  Managing your Medications? N  Managing your Finances? N  Housekeeping or managing your Housekeeping? N    Patient Care Team: [name], MD as PCP - General (Internal Medicine) Francyne Headland, MD as PCP - Cardiology (Cardiology) Cindie Ole DASEN, MD as PCP - Electrophysiology (Cardiology) Leslee Reusing, MD as Consulting Physician (Ophthalmology) Merceda Lela SAUNDERS, [facility] (Pharmacist)  I have updated your Care Teams any recent Medical Services you may have received from other providers in the past year.     Assessment:   This is a routine wellness examination for Catharina.  Hearing/Vision screen Hearing Screening - Comments:: Denies hearing difficulties   Vision Screening - Comments:: Wears eyeglasses/ Dr. McCuen   Goals Addressed             This Visit's Progress    Patient Stated   On track    I want continue to be healthy and maintain my weight.        Depression Screen     06/16/2023   10:41 AM 10/28/2022   10:00 AM 05/13/2022    8:23 AM 09/26/2021    1:13 PM 09/26/2021    1:11 PM 02/20/2021    9:44 AM 10/28/2020   10:48 AM  PHQ 2/9 Scores  PHQ - 2 Score 0 0 0 0 0 0 0  PHQ- 9 Score 0  1   0 0    Fall Risk     10/31/2023    3:54 PM 10/28/2022   10:02 AM 10/25/2022    1:56 PM 05/13/2022    8:22 AM 09/26/2021    1:13 PM  Fall Risk   Falls in the past year? 0 1 1 1  0  Number falls in past yr: 0 0 0 0 0  Injury with Fall? 0 0 0 0 0  Risk for fall due to :  No Fall Risks  No Fall Risks   Follow up Falls evaluation completed;Falls prevention discussed Falls prevention discussed  Falls evaluation completed Falls evaluation  completed;Education provided      Data saved with a previous flowsheet row definition    MEDICARE RISK AT HOME:  Medicare Risk at Home Any stairs in or around the home?: (Patient-Rptd) Yes If so, are there any without handrails?: (Patient-Rptd) No Home free of loose throw rugs in walkways, pet beds, electrical cords, etc?: (Patient-Rptd) No Adequate lighting in your home to reduce risk of falls?: (Patient-Rptd) Yes Life alert?: (Patient-Rptd) No Use of a cane, walker or w/c?: (Patient-Rptd) No Grab bars in the bathroom?: (Patient-Rptd) Yes Shower chair or bench in shower?: (Patient-Rptd) No Elevated toilet seat or a handicapped toilet?: (Patient-Rptd) No  TIMED UP AND GO:  Was the test performed?  No  Cognitive Function: Declined/Normal: No cognitive concerns noted by patient or family. Patient alert, oriented, able to answer questions appropriately and recall recent events. No signs of memory loss or confusion.        10/28/2022   10:03 AM  6CIT Screen  What Year? 0 points  What month? 0 points  What time? 0 points  Count back from 20 0 points  Months in reverse 0 points  Repeat phrase 0 points  Total Score 0 points    Immunizations Immunization  History  Administered Date(s) Administered   Fluad Quad(high Dose 65+) 12/23/2021   Influenza, High Dose Seasonal PF 01/06/2017, 12/09/2017   Influenza,inj,Quad PF,6+ Mos 11/05/2015, 12/09/2018   Influenza-Unspecified 12/15/2014, 12/15/2019, 12/14/2020, 12/14/2022   PFIZER(Purple Top)SARS-COV-2 Vaccination 04/23/2019, 05/18/2019, 08/21/2019, 12/15/2019, 12/23/2021   Pneumococcal Conjugate-13 08/29/2014   Pneumococcal Polysaccharide-23 01/01/2011, 01/26/2018   Tdap 10/08/2010   Zoster Recombinant(Shingrix ) 08/13/2020, 02/13/2021   Zoster, Live 06/20/2012    Screening Tests Health Maintenance  Topic Date Due   Diabetic kidney evaluation - Urine ACR  Never done   DTaP/Tdap/Td (2 - Td or Tdap) 10/07/2020   COVID-19 Vaccine  (6 - 2024-25 season) 11/15/2022   Medicare Annual Wellness (AWV)  10/28/2023   INFLUENZA VACCINE  10/15/2023   MAMMOGRAM  11/30/2023   OPHTHALMOLOGY EXAM  01/08/2024   HEMOGLOBIN A1C  03/18/2024   Diabetic kidney evaluation - eGFR measurement  06/15/2024   FOOT EXAM  06/15/2024   Pneumococcal Vaccine: 50+ Years  Completed   DEXA SCAN  Completed   Hepatitis C Screening  Completed   Zoster Vaccines- Shingrix   Completed   HPV VACCINES  Aged Out   Meningococcal B Vaccine  Aged Out   Colonoscopy  Discontinued    Health Maintenance  Health Maintenance Due  Topic Date Due   Diabetic kidney evaluation - Urine ACR  Never done   DTaP/Tdap/Td (2 - Td or Tdap) 10/07/2020   COVID-19 Vaccine (6 - 2024-25 season) 11/15/2022   Medicare Annual Wellness (AWV)  10/28/2023   INFLUENZA VACCINE  10/15/2023   Health Maintenance Items Addressed: Labs Ordered: UACR, See Nurse Notes at the end of this note  Additional Screening:  Vision Screening: Recommended annual ophthalmology exams for early detection of glaucoma and other disorders of the eye. Would you like a referral to an eye doctor? No    Dental Screening: Recommended annual dental exams for proper oral hygiene  Community Resource Referral / Chronic Care Management: CRR required this visit?  No   CCM required this visit?  No   Plan:    I have personally reviewed and noted the following in the patient's chart:   Medical and social history Use of alcohol, tobacco or illicit drugs  Current medications and supplements including opioid prescriptions. Patient is not currently taking opioid prescriptions. Functional ability and status Nutritional status Physical activity Advanced directives List of other physicians Hospitalizations, surgeries, and ER visits in previous 12 months Vitals Screenings to include cognitive, depression, and falls Referrals and appointments  In addition, I have reviewed and discussed with patient certain  preventive protocols, quality metrics, and best practice recommendations. A written personalized care plan for preventive services as well as general preventive health recommendations were provided to patient.   Merlie Noga L Saman Umstead, CMA   11/01/2023   After Visit Summary: (MyChart) Due to this being a telephonic visit, the after visit summary with patients personalized plan was offered to patient via MyChart   Notes: Patient is due for a DEXA, however, patient's GYN places the order and patient stated that she has an appointment coming up to see GYN,  Patient is also due for a UACR which order has been placed today.  She is due for a Tdap as well.  She stated that she needed a refill on Clonazepam  0.5 mg, however, I informed patient to call the pharmacy as she may have refills on the medication.  She had no other concerns to address today.

## 2023-11-08 DIAGNOSIS — J3081 Allergic rhinitis due to animal (cat) (dog) hair and dander: Secondary | ICD-10-CM | POA: Diagnosis not present

## 2023-11-08 DIAGNOSIS — J301 Allergic rhinitis due to pollen: Secondary | ICD-10-CM | POA: Diagnosis not present

## 2023-11-08 DIAGNOSIS — J3089 Other allergic rhinitis: Secondary | ICD-10-CM | POA: Diagnosis not present

## 2023-11-16 DIAGNOSIS — J3089 Other allergic rhinitis: Secondary | ICD-10-CM | POA: Diagnosis not present

## 2023-11-16 DIAGNOSIS — J3081 Allergic rhinitis due to animal (cat) (dog) hair and dander: Secondary | ICD-10-CM | POA: Diagnosis not present

## 2023-11-16 DIAGNOSIS — J301 Allergic rhinitis due to pollen: Secondary | ICD-10-CM | POA: Diagnosis not present

## 2023-11-25 DIAGNOSIS — J3081 Allergic rhinitis due to animal (cat) (dog) hair and dander: Secondary | ICD-10-CM | POA: Diagnosis not present

## 2023-11-25 DIAGNOSIS — J3089 Other allergic rhinitis: Secondary | ICD-10-CM | POA: Diagnosis not present

## 2023-12-03 ENCOUNTER — Encounter: Payer: Self-pay | Admitting: Internal Medicine

## 2023-12-06 ENCOUNTER — Other Ambulatory Visit: Payer: Self-pay | Admitting: Internal Medicine

## 2023-12-06 DIAGNOSIS — Z1231 Encounter for screening mammogram for malignant neoplasm of breast: Secondary | ICD-10-CM | POA: Diagnosis not present

## 2023-12-06 DIAGNOSIS — F411 Generalized anxiety disorder: Secondary | ICD-10-CM

## 2023-12-06 DIAGNOSIS — F40243 Fear of flying: Secondary | ICD-10-CM

## 2023-12-06 LAB — HM MAMMOGRAPHY

## 2023-12-06 MED ORDER — CLONAZEPAM 0.5 MG PO TABS: 0.5000 mg | ORAL_TABLET | Freq: Two times a day (BID) | ORAL | 2 refills | Status: DC | PRN

## 2023-12-06 NOTE — Telephone Encounter (Signed)
 Pt requesting refill of clonazepam  sent to [facility]

## 2023-12-08 ENCOUNTER — Encounter: Payer: Self-pay | Admitting: Internal Medicine

## 2023-12-11 ENCOUNTER — Other Ambulatory Visit: Payer: Self-pay | Admitting: Internal Medicine

## 2023-12-11 ENCOUNTER — Other Ambulatory Visit: Payer: Self-pay | Admitting: Cardiology

## 2023-12-11 DIAGNOSIS — E118 Type 2 diabetes mellitus with unspecified complications: Secondary | ICD-10-CM

## 2023-12-16 DIAGNOSIS — D485 Neoplasm of uncertain behavior of skin: Secondary | ICD-10-CM | POA: Diagnosis not present

## 2023-12-16 DIAGNOSIS — Z85828 Personal history of other malignant neoplasm of skin: Secondary | ICD-10-CM | POA: Diagnosis not present

## 2023-12-16 DIAGNOSIS — L720 Epidermal cyst: Secondary | ICD-10-CM | POA: Diagnosis not present

## 2023-12-16 DIAGNOSIS — L814 Other melanin hyperpigmentation: Secondary | ICD-10-CM | POA: Diagnosis not present

## 2023-12-16 DIAGNOSIS — D229 Melanocytic nevi, unspecified: Secondary | ICD-10-CM | POA: Diagnosis not present

## 2023-12-16 DIAGNOSIS — L821 Other seborrheic keratosis: Secondary | ICD-10-CM | POA: Diagnosis not present

## 2023-12-16 DIAGNOSIS — D239 Other benign neoplasm of skin, unspecified: Secondary | ICD-10-CM | POA: Diagnosis not present

## 2023-12-16 DIAGNOSIS — D1801 Hemangioma of skin and subcutaneous tissue: Secondary | ICD-10-CM | POA: Diagnosis not present

## 2023-12-16 DIAGNOSIS — D2261 Melanocytic nevi of right upper limb, including shoulder: Secondary | ICD-10-CM | POA: Diagnosis not present

## 2023-12-16 DIAGNOSIS — L578 Other skin changes due to chronic exposure to nonionizing radiation: Secondary | ICD-10-CM | POA: Diagnosis not present

## 2023-12-16 DIAGNOSIS — D2361 Other benign neoplasm of skin of right upper limb, including shoulder: Secondary | ICD-10-CM | POA: Diagnosis not present

## 2023-12-27 ENCOUNTER — Ambulatory Visit: Payer: Self-pay | Admitting: Internal Medicine

## 2023-12-27 ENCOUNTER — Ambulatory Visit: Admitting: Internal Medicine

## 2023-12-27 VITALS — BP 138/84 | HR 69 | Temp 98.2°F | Resp 16 | Ht 65.0 in | Wt 180.0 lb

## 2023-12-27 DIAGNOSIS — F409 Phobic anxiety disorder, unspecified: Secondary | ICD-10-CM

## 2023-12-27 DIAGNOSIS — F411 Generalized anxiety disorder: Secondary | ICD-10-CM

## 2023-12-27 DIAGNOSIS — F5105 Insomnia due to other mental disorder: Secondary | ICD-10-CM | POA: Diagnosis not present

## 2023-12-27 DIAGNOSIS — I480 Paroxysmal atrial fibrillation: Secondary | ICD-10-CM | POA: Diagnosis not present

## 2023-12-27 DIAGNOSIS — Z794 Long term (current) use of insulin: Secondary | ICD-10-CM

## 2023-12-27 DIAGNOSIS — I10 Essential (primary) hypertension: Secondary | ICD-10-CM

## 2023-12-27 DIAGNOSIS — K7581 Nonalcoholic steatohepatitis (NASH): Secondary | ICD-10-CM

## 2023-12-27 DIAGNOSIS — F325 Major depressive disorder, single episode, in full remission: Secondary | ICD-10-CM | POA: Diagnosis not present

## 2023-12-27 DIAGNOSIS — E119 Type 2 diabetes mellitus without complications: Secondary | ICD-10-CM

## 2023-12-27 LAB — BASIC METABOLIC PANEL WITH GFR
BUN: 14 mg/dL (ref 6–23)
CO2: 26 meq/L (ref 19–32)
Calcium: 9.2 mg/dL (ref 8.4–10.5)
Chloride: 104 meq/L (ref 96–112)
Creatinine, Ser: 0.67 mg/dL (ref 0.40–1.20)
GFR: 85.42 mL/min (ref >60.00)
Glucose, Bld: 122 mg/dL — ABNORMAL HIGH (ref 70–99)
Potassium: 3.7 meq/L (ref 3.5–5.1)
Sodium: 138 meq/L (ref 135–145)

## 2023-12-27 LAB — CBC WITH DIFFERENTIAL/PLATELET
Basophils Absolute: 0.1 K/uL (ref 0.0–0.1)
Basophils Relative: 0.7 % (ref 0.0–3.0)
Eosinophils Absolute: 0.3 K/uL (ref 0.0–0.7)
Eosinophils Relative: 3.1 % (ref 0.0–5.0)
HCT: 43.3 % (ref 36.0–46.0)
Hemoglobin: 13.9 g/dL (ref 12.0–15.0)
Lymphocytes Relative: 44 % (ref 12.0–46.0)
Lymphs Abs: 3.7 K/uL (ref 0.7–4.0)
MCHC: 32 g/dL (ref 30.0–36.0)
MCV: 82.3 fl (ref 78.0–100.0)
Monocytes Absolute: 0.5 K/uL (ref 0.1–1.0)
Monocytes Relative: 5.7 % (ref 3.0–12.0)
Neutro Abs: 3.9 K/uL (ref 1.4–7.7)
Neutrophils Relative %: 46.5 % (ref 43.0–77.0)
Platelets: 266 K/uL (ref 150.0–400.0)
RBC: 5.26 Mil/uL — ABNORMAL HIGH (ref 3.87–5.11)
RDW: 15.2 % (ref 11.5–15.5)
WBC: 8.4 K/uL (ref 4.0–10.5)

## 2023-12-27 LAB — HEPATIC FUNCTION PANEL
ALT: 17 U/L (ref 0–35)
AST: 22 U/L (ref 0–37)
Albumin: 4.6 g/dL (ref 3.5–5.2)
Alkaline Phosphatase: 66 U/L (ref 39–117)
Bilirubin, Direct: 0.1 mg/dL (ref 0.0–0.3)
Total Bilirubin: 0.5 mg/dL (ref 0.2–1.2)
Total Protein: 7.3 g/dL (ref 6.0–8.3)

## 2023-12-27 LAB — HEMOGLOBIN A1C: Hgb A1c MFr Bld: 7.5 % — ABNORMAL HIGH (ref 4.6–6.5)

## 2023-12-27 MED ORDER — CLONAZEPAM 0.5 MG PO TABS: 0.5000 mg | ORAL_TABLET | Freq: Two times a day (BID) | ORAL | 2 refills | Status: AC | PRN

## 2023-12-27 MED ORDER — DULOXETINE HCL 60 MG PO CPEP: 60.0000 mg | ORAL_CAPSULE | Freq: Every day | ORAL | 0 refills | Status: DC

## 2023-12-27 MED ORDER — ZOLPIDEM TARTRATE 5 MG PO TABS: 5.0000 mg | ORAL_TABLET | Freq: Every evening | ORAL | 2 refills | Status: AC | PRN

## 2023-12-27 MED ORDER — FREESTYLE LIBRE 3 PLUS SENSOR MISC: 1.0000 | 1 refills | Status: AC

## 2023-12-27 NOTE — Progress Notes (Signed)
 "  Subjective:  Patient ID: Diana Reeves, female    DOB: 02-26-1951  Age: 73 y.o. MRN: [id number]  CC: Hypertension and Diabetes   HPI Diana Reeves presents for f/up ----  Discussed the use of AI scribe software for clinical note transcription with the patient, who gave verbal consent to proceed.  History of Present Illness Diana Reeves is a 73 year old female with diabetes who presents with gastrointestinal side effects from metformin .  She experiences vomiting, diarrhea, and cramping abdominal pain after taking metformin . These symptoms prompted her to stop the medication while on a trip to [facility]. Upon resuming the medication two weeks later, she experienced similar symptoms, including cramping, diarrhea, and vomiting. The abdominal pain is described as crampy and associated with diarrhea, but not chronic. Discomfort occurs after episodes of vomiting or diarrhea, lasting a couple of days before subsiding.  She notes a weight fluctuation, currently weighing 180 pounds, down from 183 pounds. Her appetite varies, especially when on metformin , but she generally maintains a good appetite, with a preference for sweets. She previously tried Mounjaro , which also caused discomfort, but did not affect her craving for sweets.  No dizziness, lightheadedness, or heart concerns. She has received both flu and COVID vaccines.  Her current medications include Klonopin  and Ambien . She mentions a previous issue with transferring her prescriptions to a different location.     Outpatient Medications Prior to Visit  Medication Sig Dispense Refill   Azelastine -Fluticasone  137-50 MCG/ACT SUSP Place 1 spray into both nostrils daily as needed (allergies).     diltiazem  (CARDIZEM  CD) 180 MG 24 hr capsule Take 1 capsule (180 mg total) by mouth daily. Please call [phone] to schedule an overdue appointment with Dr. Ole Holts for future refills. Thank you. 1st attempt. 30 capsule 0    dorzolamide-timolol (COSOPT) 22.3-6.8 MG/ML ophthalmic solution Place 1 drop into the right eye 2 (two) times daily.     EPINEPHrine 0.3 mg/0.3 mL IJ SOAJ injection Inject 0.3 mg into the muscle as needed for anaphylaxis.     esomeprazole  (NEXIUM ) 40 MG capsule TAKE ONE CAPSULE BY MOUTH EVERY MORNING BEFORE BREAKFAST 90 capsule 1   JARDIANCE  25 MG TABS tablet TAKE 1 TABLET BY MOUTH DAILY 90 tablet 1   NONFORMULARY OR COMPOUNDED ITEM Estradiol  vaginal cream 0.02% insert 1 gram vaginally twice weekly.  Dispense 24 grams/3RF 1 each 3   rivaroxaban  (XARELTO ) 20 MG TABS tablet Take 1 tablet (20 mg total) by mouth daily with supper. 90 tablet 1   rosuvastatin  (CRESTOR ) 10 MG tablet TAKE 1 TABLET BY MOUTH DAILY 90 tablet 1   clonazePAM  (KLONOPIN ) 0.5 MG tablet Take 1 tablet (0.5 mg total) by mouth 2 (two) times daily as needed for anxiety. 60 tablet 2   DULoxetine  (CYMBALTA ) 60 MG capsule Take 1 capsule (60 mg total) by mouth daily. 90 capsule 1   metFORMIN  (GLUCOPHAGE -XR) 750 MG 24 hr tablet TAKE 1 TABLET BY MOUTH DAILY WITH BREAKFAST 90 tablet 1   zolpidem  (AMBIEN ) 5 MG tablet Take 1 tablet (5 mg total) by mouth at bedtime as needed for sleep. 15 tablet 2   Blood Glucose Calibration (OT ULTRA/FASTTK CNTRL SOLN) SOLN 1 Act by In Vitro route 2 (two) times daily. (Patient not taking: Reported on 01/21/2023) 1 each 2   Blood Glucose Monitoring Suppl (ONE TOUCH ULTRA 2) w/Device KIT 1 Act by Does not apply route 2 (two) times daily. (Patient not taking: Reported on 01/21/2023) 1 kit 1  glucose blood (ONETOUCH ULTRA) test strip Use as instructed 100 each 5   metroNIDAZOLE  (METROGEL ) 0.75 % vaginal gel Place 1 Applicatorful vaginally as needed (As needed per patient). As needed     No facility-administered medications prior to visit.    ROS Review of Systems  Constitutional:  Negative for appetite change, chills, diaphoresis, fatigue and fever.  HENT: Negative.    Respiratory: Negative.  Negative for  cough, chest tightness, shortness of breath and wheezing.   Cardiovascular:  Negative for chest pain, palpitations and leg swelling.  Gastrointestinal:  Positive for diarrhea, nausea and vomiting. Negative for abdominal pain and blood in stool.  Genitourinary: Negative.  Negative for difficulty urinating and dysuria.  Musculoskeletal: Negative.  Negative for arthralgias and myalgias.  Skin: Negative.  Negative for color change and rash.  Neurological:  Negative for dizziness and weakness.  Hematological:  Negative for adenopathy. Does not bruise/bleed easily.  Psychiatric/Behavioral:  Positive for sleep disturbance. Negative for behavioral problems, confusion, decreased concentration and dysphoric mood. The patient is nervous/anxious.     Objective:  BP 138/84 (BP Location: Left Arm, Patient Position: Sitting, Cuff Size: Normal)   Pulse 69   Temp 98.2 F (36.8 C) (Oral)   Resp 16   Ht 5' 5 (1.651 m)   Wt 180 lb (81.6 kg)   LMP 03/17/1991   SpO2 97%   BMI 29.95 kg/m   BP Readings from Last 3 Encounters:  12/29/23 138/84  06/16/23 138/84  02/23/23 (!) 120/56    Wt Readings from Last 3 Encounters:  12/29/23 180 lb (81.6 kg)  11/01/23 183 lb (83 kg)  06/16/23 183 lb (83 kg)    Physical Exam Vitals reviewed.  Constitutional:      Appearance: Normal appearance.  HENT:     Nose: Nose normal.     Mouth/Throat:     Mouth: Mucous membranes are moist.  Eyes:     General: No scleral icterus.    Conjunctiva/sclera: Conjunctivae normal.  Cardiovascular:     Rate and Rhythm: Normal rate and regular rhythm.     Heart sounds: No murmur heard.    No friction rub. No gallop.  Pulmonary:     Effort: Pulmonary effort is normal.     Breath sounds: No stridor. No wheezing, rhonchi or rales.  Abdominal:     General: Abdomen is flat.     Palpations: There is no mass.     Tenderness: There is no abdominal tenderness. There is no guarding.     Hernia: No hernia is present.   Musculoskeletal:        General: Normal range of motion.     Cervical back: Neck supple.     Right lower leg: No edema.     Left lower leg: No edema.  Lymphadenopathy:     Cervical: No cervical adenopathy.  Skin:    General: Skin is warm and dry.  Neurological:     General: No focal deficit present.     Mental Status: She is alert. Mental status is at baseline.  Psychiatric:        Mood and Affect: Mood normal.        Behavior: Behavior normal.     Lab Results  Component Value Date   WBC 8.4 12/27/2023   HGB 13.9 12/27/2023   HCT 43.3 12/27/2023   PLT 266.0 12/27/2023   GLUCOSE 122 (H) 12/27/2023   CHOL 135 06/16/2023   TRIG 133.0 06/16/2023   HDL 54.70 06/16/2023  LDLCALC 53 06/16/2023   ALT 17 12/27/2023   AST 22 12/27/2023   NA 138 12/27/2023   K 3.7 12/27/2023   CL 104 12/27/2023   CREATININE 0.67 12/27/2023   BUN 14 12/27/2023   CO2 26 12/27/2023   TSH 0.71 06/16/2023   INR 1.2 (H) 06/16/2023   HGBA1C 7.5 (H) 12/27/2023    US  ABDOMEN LIMITED RUQ (LIVER/GB) Result Date: 08/08/2023 CLINICAL DATA:  gallbladder polyp EXAM: ULTRASOUND ABDOMEN LIMITED RIGHT UPPER QUADRANT COMPARISON:  July 06, 2022 FINDINGS: Gallbladder: No gallstones visualized. Revisualization of a focal rounded density along the inner gallbladder wall. It is challenging to visualize on today's exam and is estimated to measure 5-6 mm, remeasured. It is only visualized in the transverse plane on today's exam. No sonographic Murphy sign noted by sonographer. Common bile duct: Diameter: Visualized portion measures 3 mm, within normal limits. Liver: No focal lesion identified. Heterogeneous and mildly increased in parenchymal echogenicity with poor acoustic penetration. Portal vein is patent on color Doppler imaging with normal direction of blood flow towards the liver. Other: None. IMPRESSION: 1. On today's exam, previously described possible polyp is poorly visualized. It measures approximately 5-6 mm.  Recommend follow-up ultrasound in 12 months to assess for stability. 2. Hepatic steatosis. Electronically Signed   By: Corean Salter M.D.   On: 08/08/2023 12:43   Fibrosis 4 Score = 1.5  Fib-4 interpretation is not validated for people under 35 or over 33 years of age. However, scores under 2.0 are generally considered low risk.   Assessment & Plan:   Insulin-requiring or dependent type II diabetes mellitus (HCC)- Will start basal insulin if her blood sugar rises. -     Basic metabolic panel with GFR; Future -     Hemoglobin A1c; Future -     FreeStyle Libre 3 Plus Sensor; Apply 1 Act topically every 14 (fourteen) days. Change sensor every 15 days.  Dispense: 6 each; Refill: 1 -     Toujeo  Max SoloStar; Inject 20 Units into the skin daily.  Dispense: 6 mL; Refill: 0  Essential hypertension, benign- Her blood sugar is well controlled. -     CBC with Differential/Platelet; Future -     Basic metabolic panel with GFR; Future  Nonalcoholic steatohepatitis (NASH) -     Hepatic function panel; Future  GAD (generalized anxiety disorder) -     clonazePAM ; Take 1 tablet (0.5 mg total) by mouth 2 (two) times daily as needed for anxiety.  Dispense: 60 tablet; Refill: 2  Major depressive disorder with single episode, in full remission -     DULoxetine  HCl; Take 1 capsule (60 mg total) by mouth daily.  Dispense: 90 capsule; Refill: 0  Insomnia due to anxiety and fear -     Zolpidem  Tartrate; Take 1 tablet (5 mg total) by mouth at bedtime as needed for sleep.  Dispense: 15 tablet; Refill: 2  PAF (paroxysmal atrial fibrillation) (HCC)- She has good R/R control/     Follow-up: Return in about 3 months (around 03/28/2024).  Debby Molt, MD "

## 2023-12-27 NOTE — Patient Instructions (Signed)

## 2023-12-28 MED ORDER — TOUJEO MAX SOLOSTAR 300 UNIT/ML ~~LOC~~ SOPN: 20.0000 [IU] | PEN_INJECTOR | Freq: Every day | SUBCUTANEOUS | 0 refills | Status: AC

## 2023-12-29 ENCOUNTER — Encounter: Payer: Self-pay | Admitting: Internal Medicine

## 2023-12-30 ENCOUNTER — Ambulatory Visit: Admitting: Nurse Practitioner

## 2023-12-30 ENCOUNTER — Encounter: Payer: Self-pay | Admitting: Nurse Practitioner

## 2023-12-30 VITALS — BP 116/74 | HR 72 | Resp 16 | Ht 64.25 in | Wt 180.0 lb

## 2023-12-30 DIAGNOSIS — Z01419 Encounter for gynecological examination (general) (routine) without abnormal findings: Secondary | ICD-10-CM

## 2023-12-30 DIAGNOSIS — N952 Postmenopausal atrophic vaginitis: Secondary | ICD-10-CM | POA: Diagnosis not present

## 2023-12-30 DIAGNOSIS — Z9189 Other specified personal risk factors, not elsewhere classified: Secondary | ICD-10-CM | POA: Diagnosis not present

## 2023-12-30 DIAGNOSIS — B009 Herpesviral infection, unspecified: Secondary | ICD-10-CM

## 2023-12-30 DIAGNOSIS — Z780 Asymptomatic menopausal state: Secondary | ICD-10-CM

## 2023-12-30 DIAGNOSIS — M810 Age-related osteoporosis without current pathological fracture: Secondary | ICD-10-CM | POA: Diagnosis not present

## 2023-12-30 NOTE — Progress Notes (Signed)
   Diana Reeves 05/10/1950 [id number]   History:  73 y.o. G3P3003 presents for breast and pelvic exam. Postmenopausal - no systemic HRT. Using compounded vaginal estrogen. S/P 1993 TAH LSO with posterior repair. A fib followed by cardiology. Osteoporosis, was on Actonel years ago. Taking Vit D supplement, does water aerobics.  H/O HTN, T2DM, HLD. HSV 2.   Gynecologic History Patient's last menstrual period was 03/17/1991.   Contraception: post menopausal status Sexually active: No  Health Maintenance Last Pap: 10/20/2013  Last mammogram: 12/06/2023. Results were: Normal Last colonoscopy: 02/23/2023 Last Dexa: 03/04/2021. Results were: T-score -2.3  Past medical history, past surgical history, family history and social history were all reviewed and documented in the EPIC chart. Retired Systems developer. Still subs. 2 sons in [place], 1 son here. Father diagnosed with colon cancer at age 10.   ROS:  A ROS was performed and pertinent positives and negatives are included.  Exam:  Vitals:   12/30/23 1527  BP: 116/74  Pulse: 72  Resp: 16  Weight: 180 lb (81.6 kg)  Height: 5' 4.25 (1.632 m)    Body mass index is 30.66 kg/m. Physical Exam Constitutional:      Appearance: Normal appearance.  Cardiovascular:     Rate and Rhythm: Normal rate and regular rhythm.  Pulmonary:     Breath sounds: Normal breath sounds.  Abdominal:     Palpations: Abdomen is soft.     Tenderness: There is no abdominal tenderness.  Genitourinary:    General: Normal vulva.     Vagina: Normal.     Uterus: Absent.      Adnexa: Right adnexa normal.     Comments: Atrophic changes     Zada Louder, CMA present as chaperone.   Assessment/Plan:  73 y.o. [id number] for breast and pelvic exam.   Encounter for breast and pelvic examination - Education provided on SBEs, importance of preventative screenings, current guidelines, high calcium  diet, regular exercise, and multivitamin daily. Labs done elsewhere.    Postmenopausal - S/P 1993 TAH LSO with posterior repair. No systemic HRT.   Age-related osteoporosis without current pathological fracture - Plan: DG Bone Density. 02/2021 T-score -2.3. Was on Actonel previously. Taking Vit D supplement and doing water aerobics.   Vaginal atrophy - Plan: NONFORMULARY OR COMPOUNDED ITEM. Using compounded vaginal estrogen cream 0.2% 1 gram twice weekly. Refills provided by PCP recently.   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 Had one outbreak last year.   Screening for cervical cancer - Normal Pap history.  No longer screening per guidelines.   Screening for breast cancer - Normal mammogram history.  Continue annual screenings.  Normal breast exam today.  Screening for colon cancer - 02/2023 colonoscopy. Screenings no longer recommended.    Return in about 1 year (around 12/29/2024) for B&P (high risk).    Diana DELENA Shutter DNP, 4:03 PM 12/30/2023

## 2024-01-01 ENCOUNTER — Other Ambulatory Visit: Payer: Self-pay | Admitting: Internal Medicine

## 2024-01-01 DIAGNOSIS — F325 Major depressive disorder, single episode, in full remission: Secondary | ICD-10-CM

## 2024-01-05 ENCOUNTER — Encounter: Payer: Self-pay | Admitting: Internal Medicine

## 2024-01-05 ENCOUNTER — Ambulatory Visit: Admitting: Internal Medicine

## 2024-01-05 ENCOUNTER — Ambulatory Visit: Payer: Self-pay

## 2024-01-05 VITALS — BP 132/84 | HR 86 | Temp 97.7°F | Ht 64.25 in | Wt 183.8 lb

## 2024-01-05 DIAGNOSIS — S90562A Insect bite (nonvenomous), left ankle, initial encounter: Secondary | ICD-10-CM

## 2024-01-05 DIAGNOSIS — W57XXXA Bitten or stung by nonvenomous insect and other nonvenomous arthropods, initial encounter: Secondary | ICD-10-CM

## 2024-01-05 MED ORDER — DOXYCYCLINE HYCLATE 100 MG PO TABS: 100.0000 mg | ORAL_TABLET | Freq: Two times a day (BID) | ORAL | 0 refills | Status: AC

## 2024-01-05 MED ORDER — TRIAMCINOLONE ACETONIDE 0.5 % EX CREA: 1.0000 | TOPICAL_CREAM | Freq: Four times a day (QID) | CUTANEOUS | 0 refills | Status: AC

## 2024-01-05 NOTE — Telephone Encounter (Signed)
   FYI Only or Action Required?: FYI only for provider.  Patient was last seen in primary care on 12/27/2023 by [name], MD.  Called Nurse Triage reporting Insect Bite.  Symptoms began several days ago.  Interventions attempted: OTC medications: antihistamine, hydrocortisone cream.  Symptoms are: gradually worsening.  Triage Disposition: See PCP When Office is Open (Within 3 Days)  Patient/caregiver understands and will follow disposition?: Yes  Due to pus on bug bites, gave pt appt today to be evaluated. Gave instructions on when to go call back.    Copied from CRM [id number]. Topic: Clinical - Red Word Triage >> Jan 05, 2024  9:52 AM Diana Reeves wrote: Red Word that prompted transfer to Nurse Triage: Patient stated she has been bit by insect on foot , puss and swollen Reason for Disposition  [1] SEVERE local itching (e.g., interferes with work, school, sleep) AND [2] not improved after 24 hours of hydrocortisone cream  Answer Assessment - Initial Assessment Questions Pt states she walked through some shrubbary on Saturday and immediately felt about 10 bites. She states immediately was itchy. She's taken an anithistamine and used hydrocortizone cream. She states that does help. Last night she noticed the bites now had what looked like pus pockets on them. She states everyone scared her that she showed it to last night so she wanted to get it checked out. She states her foot is swollen but only around the bites. She states that does get worse as the day goes on.     1. TYPE of INSECT: What type of insect was it?      unknown 2. ONSET: When did you get bitten?      Saturday afternoon 3. LOCATION: Where is the insect bite located?      Left foot, top and around the ankle 4. REDNESS: Is the area red or pink? If Yes, ask: What size is the area of redness? (inches or cm). When did the redness start?     Each bump is red  6. ITCHING: Does it itch? If Yes, ask: How  bad is the itch?      It was but not as much now.  7. SWELLING: How big is the swelling? (e.g., inches, cm, or compare to coins)     Just around the bites, worse as the day gotes  8. OTHER SYMPTOMS: Do you have any other symptoms?  (e.g., difficulty breathing, fever, hives)     no  Protocols used: Insect Bite-A-AH

## 2024-01-05 NOTE — Progress Notes (Signed)
 Subjective:  Patient ID: ANAH BILLARD, female    DOB: [birth year]  Age: 73 y.o. MRN: [id number]  CC: Insect Bite (Insect bites )   HPI CORIE ALLIS presents for L lower leg since Sat night.  She was crossing through low shrubbery and weeds when she developed a sudden onset of pain and itching in the left ankle.  She was wearing shorts.  She did not see what bit her.  She developed a red bump which became blisters.  Her ankle was red and swollen and tight yesterday night.  She took antihistamines, use cortisone cream. Her rash is 50% better now  Outpatient Medications Prior to Visit  Medication Sig Dispense Refill   Azelastine -Fluticasone  137-50 MCG/ACT SUSP Place 1 spray into both nostrils daily as needed (allergies).     clonazePAM  (KLONOPIN ) 0.5 MG tablet Take 1 tablet (0.5 mg total) by mouth 2 (two) times daily as needed for anxiety. 60 tablet 2   diltiazem  (CARDIZEM  CD) 180 MG 24 hr capsule Take 1 capsule (180 mg total) by mouth daily. Please call [phone] to schedule an overdue appointment with Dr. Ole Holts for future refills. Thank you. 1st attempt. 30 capsule 0   dorzolamide-timolol (COSOPT) 22.3-6.8 MG/ML ophthalmic solution Place 1 drop into the right eye 2 (two) times daily.     DULoxetine  (CYMBALTA ) 60 MG capsule Take 1 capsule (60 mg total) by mouth daily. 90 capsule 0   EPINEPHrine 0.3 mg/0.3 mL IJ SOAJ injection Inject 0.3 mg into the muscle as needed for anaphylaxis.     esomeprazole  (NEXIUM ) 40 MG capsule TAKE ONE CAPSULE BY MOUTH EVERY MORNING BEFORE BREAKFAST 90 capsule 1   insulin glargine, 2 Unit Dial, (TOUJEO MAX SOLOSTAR) 300 UNIT/ML Solostar Pen Inject 20 Units into the skin daily. 6 mL 0   JARDIANCE  25 MG TABS tablet TAKE 1 TABLET BY MOUTH DAILY 90 tablet 1   NONFORMULARY OR COMPOUNDED ITEM Estradiol  vaginal cream 0.02% insert 1 gram vaginally twice weekly.  Dispense 24 grams/3RF 1 each 3   rivaroxaban  (XARELTO ) 20 MG TABS tablet Take 1 tablet (20 mg  total) by mouth daily with supper. 90 tablet 1   rosuvastatin  (CRESTOR ) 10 MG tablet TAKE 1 TABLET BY MOUTH DAILY 90 tablet 1   zolpidem  (AMBIEN ) 5 MG tablet Take 1 tablet (5 mg total) by mouth at bedtime as needed for sleep. 15 tablet 2   Continuous Glucose Sensor (FREESTYLE LIBRE 3 PLUS SENSOR) MISC Apply 1 Act topically every 14 (fourteen) days. Change sensor every 15 days. (Patient not taking: Reported on 01/05/2024) 6 each 1   No facility-administered medications prior to visit.    ROS: Review of Systems  Constitutional:  Negative for chills and fever.  Musculoskeletal:  Negative for arthralgias.  Skin:  Positive for color change and rash.    Objective:  BP 132/84   Pulse 86   Temp 97.7 F (36.5 C)   Ht 5' 4.25 (1.632 m)   Wt 183 lb 12.8 oz (83.4 kg)   LMP 03/17/1991   SpO2 99%   BMI 31.30 kg/m   BP Readings from Last 3 Encounters:  01/05/24 132/84  12/30/23 116/74  12/29/23 138/84    Wt Readings from Last 3 Encounters:  01/05/24 183 lb 12.8 oz (83.4 kg)  12/30/23 180 lb (81.6 kg)  12/29/23 180 lb (81.6 kg)    Physical Exam Constitutional:      General: She is not in acute distress.    Appearance: Normal appearance.  She is not toxic-appearing.  Musculoskeletal:     Right lower leg: No edema.     Left lower leg: No edema.  Skin:    Findings: Erythema, lesion and rash present.  Neurological:     Mental Status: She is oriented to person, place, and time.   A cluster of erythematous papules and posterior os on the left lateral ankle was noted No pain on palpation     Lab Results  Component Value Date   WBC 8.4 12/27/2023   HGB 13.9 12/27/2023   HCT 43.3 12/27/2023   PLT 266.0 12/27/2023   GLUCOSE 122 (H) 12/27/2023   CHOL 135 06/16/2023   TRIG 133.0 06/16/2023   HDL 54.70 06/16/2023   LDLCALC 53 06/16/2023   ALT 17 12/27/2023   AST 22 12/27/2023   NA 138 12/27/2023   K 3.7 12/27/2023   CL 104 12/27/2023   CREATININE 0.67 12/27/2023   BUN 14  12/27/2023   CO2 26 12/27/2023   TSH 0.71 06/16/2023   INR 1.2 (H) 06/16/2023   HGBA1C 7.5 (H) 12/27/2023    US  ABDOMEN LIMITED RUQ (LIVER/GB) Result Date: 08/08/2023 CLINICAL DATA:  gallbladder polyp EXAM: ULTRASOUND ABDOMEN LIMITED RIGHT UPPER QUADRANT COMPARISON:  July 06, 2022 FINDINGS: Gallbladder: No gallstones visualized. Revisualization of a focal rounded density along the inner gallbladder wall. It is challenging to visualize on today's exam and is estimated to measure 5-6 mm, remeasured. It is only visualized in the transverse plane on today's exam. No sonographic Murphy sign noted by sonographer. Common bile duct: Diameter: Visualized portion measures 3 mm, within normal limits. Liver: No focal lesion identified. Heterogeneous and mildly increased in parenchymal echogenicity with poor acoustic penetration. Portal vein is patent on color Doppler imaging with normal direction of blood flow towards the liver. Other: None. IMPRESSION: 1. On today's exam, previously described possible polyp is poorly visualized. It measures approximately 5-6 mm. Recommend follow-up ultrasound in 12 months to assess for stability. 2. Hepatic steatosis. Electronically Signed   By: Corean Salter M.D.   On: 08/08/2023 12:43    Assessment & Plan:   Problem List Items Addressed This Visit     Insect bites - Primary   Most likely it is insect bites.  Doubt poison ivy, shingles etc. no signs of cellulitis.  She was crossing through low shrubbery and weeds when she developed a sudden onset of pain and itching in the left ankle.  She was wearing shorts.  She did not see what bit her.  She developed a red bump which became blisters.  Her ankle was red and swollen and tight yesterday night.  She took antihistamines, use cortisone cream. Her rash is 50% better now Triamc cream QID Rx Doxy po x 7 d if worse Pt declined Medrol  pack          Meds ordered this encounter  Medications   triamcinolone cream  (KENALOG) 0.5 %    Sig: Apply 1 Application topically 4 (four) times daily.    Dispense:  60 g    Refill:  0   doxycycline  (VIBRA -TABS) 100 MG tablet    Sig: Take 1 tablet (100 mg total) by mouth 2 (two) times daily.    Dispense:  14 tablet    Refill:  0      Follow-up: Return for f/u with PCP.  Marolyn Noel, MD

## 2024-01-05 NOTE — Assessment & Plan Note (Addendum)
 Most likely it is insect bites.  Doubt poison ivy, shingles etc. no signs of cellulitis.  She was crossing through low shrubbery and weeds when she developed a sudden onset of pain and itching in the left ankle.  She was wearing shorts.  She did not see what bit her.  She developed a red bump which became blisters.  Her ankle was red and swollen and tight yesterday night.  She took antihistamines, use cortisone cream. Her rash is 50% better now Triamc cream QID Rx Doxy po x 7 d if worse Pt declined Medrol  pack

## 2024-01-12 ENCOUNTER — Other Ambulatory Visit: Payer: Self-pay | Admitting: Internal Medicine

## 2024-01-12 ENCOUNTER — Telehealth: Payer: Self-pay | Admitting: Cardiology

## 2024-01-12 DIAGNOSIS — E118 Type 2 diabetes mellitus with unspecified complications: Secondary | ICD-10-CM

## 2024-01-12 MED ORDER — DILTIAZEM HCL ER COATED BEADS 180 MG PO CP24: 180.0000 mg | ORAL_CAPSULE | Freq: Every day | ORAL | 0 refills | Status: DC

## 2024-01-12 NOTE — Telephone Encounter (Signed)
*  STAT* If patient is at the pharmacy, call can be transferred to refill team.   1. Which medications need to be refilled? (please list name of each medication and dose if known)   diltiazem  (CARDIZEM  CD) 180 MG 24 hr capsule     2. Would you like to learn more about the convenience, safety, & potential cost savings by using the [facility]? No    3. Are you open to using the [facility] (Type [facility]. No    4. Which pharmacy/location (including street and city if local pharmacy) is medication to be sent to? [facility] [id number] - [place], [place] - [address]     5. Do they need a 30 day or 90 day supply? 90 day   Pt has appt scheduled 11/11.

## 2024-01-12 NOTE — Telephone Encounter (Signed)
 30 day supply Diltiazem  sent to [facility].

## 2024-01-19 DIAGNOSIS — H52203 Unspecified astigmatism, bilateral: Secondary | ICD-10-CM | POA: Diagnosis not present

## 2024-01-19 DIAGNOSIS — Z961 Presence of intraocular lens: Secondary | ICD-10-CM | POA: Diagnosis not present

## 2024-01-19 DIAGNOSIS — E119 Type 2 diabetes mellitus without complications: Secondary | ICD-10-CM | POA: Diagnosis not present

## 2024-01-19 DIAGNOSIS — H401412 Capsular glaucoma with pseudoexfoliation of lens, right eye, moderate stage: Secondary | ICD-10-CM | POA: Diagnosis not present

## 2024-01-19 DIAGNOSIS — H401423 Capsular glaucoma with pseudoexfoliation of lens, left eye, severe stage: Secondary | ICD-10-CM | POA: Diagnosis not present

## 2024-01-19 LAB — OPHTHALMOLOGY REPORT-SCANNED

## 2024-01-20 NOTE — Progress Notes (Signed)
 Cardiology Office Note Date:  01/20/2024  Patient ID:  Diana, Reeves 09/23/1950, MRN [id number] PCP:  [name], MD  Cardiologist:  Dr. Francyne Electrophysiologist: Dr. Cindie  Chief Complaint:   over due annual visit  History of Present Illness: Diana Reeves is a 73 y.o. female with history of NASH, DM, obesity, HTN, AFib  She saw Dr. Cindie 07/23/21, doing well though reported increased edema in increased dilt dose.  Advised compression stockigs No med changes made  I saw her 10/27/22 She feels very well No CP, palpitations or cardiac awareness. Her Afib made her tired, winded, easily fatigued >> all of wic remain resolved She does not thin she has had any AFib since her ablation No SOB, DOE She still works part time as a systems developer, does water aerobics and active with her grand kids No exertional intolerances No near syncope or syncope. No bleeding or signs of bleeding  TODAY  She continues to do very well. 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 Does not think she has had Afib post ablation She infrequently checks via [employer] > normal rhythm No SOB, DOE No near syncope or syncope No bleeding or signs of bleeding  Sees her PMD 2x/year more if needed. Recent vacation to [facility], very much enjoyed it    AFib/AAD hx Dx Aug 2022 PVI ablation 04/21/21 No AAD to date  Past Medical History:  Diagnosis Date   A-fib [facility])    Allergic state 12/26/2016   Allergy 1980   Anxiety 2012   Arthritis    Basal cell carcinoma    Benign neoplasm of colon    Degenerative cervical disc    Depression    Diabetes mellitus without complication (HCC) ?   Diverticulosis of colon (without mention of hemorrhage)    Esophageal reflux    Glaucoma    Osteoporosis, unspecified 12/2018   2007  T score -3.0, 2012 T score -1.9, 2015 T score -2.1 overall stable, 2018 T score -2.0, 2020 T score -1.8   Other chronic nonalcoholic liver disease    Sinusitis 12/26/2016   Stricture and stenosis of esophagus     Past Surgical History:  Procedure Laterality Date   ABDOMINAL HYSTERECTOMY  1993   TAH-LSO-Post. repair-Burch   ATRIAL FIBRILLATION ABLATION N/A 04/21/2021   Procedure: ATRIAL FIBRILLATION ABLATION;  Surgeon: Cindie Ole DASEN, MD;  Location: [facility] INVASIVE CV LAB;  Service: Cardiovascular;  Laterality: N/A;   CARDIOVERSION N/A 11/28/2020   Procedure: CARDIOVERSION;  Surgeon: Jeffrie Oneil BROCKS, MD;  Location: [facility] ENDOSCOPY;  Service: Cardiovascular;  Laterality: N/A;   CARDIOVERSION N/A 01/27/2021   Procedure: CARDIOVERSION;  Surgeon: Okey Vina GAILS, MD;  Location: [facility] ENDOSCOPY;  Service: Cardiovascular;  Laterality: N/A;   COLONOSCOPY     EYE SURGERY  10/18/2012   lt eye   FRACTURE SURGERY     knee arthroscop Right 2018   left arm surgery  09/2007   POLYPECTOMY     TUBAL LIGATION  1985    Current Outpatient Medications  Medication Sig Dispense Refill   Azelastine -Fluticasone  137-50 MCG/ACT SUSP Place 1 spray into both nostrils daily as needed (allergies).     clonazePAM  (KLONOPIN ) 0.5 MG tablet Take 1 tablet (0.5 mg total) by mouth 2 (two) times daily as needed for anxiety. 60 tablet 2  Continuous Glucose Sensor (FREESTYLE LIBRE 3 PLUS SENSOR) MISC Apply 1 Act topically every 14 (fourteen) days. Change sensor every 15 days. (Patient not taking: Reported on 01/05/2024) 6 each 1   diltiazem  (CARDIZEM  CD) 180 MG 24 hr capsule Take 1 capsule (180 mg total) by mouth daily. Please call [phone] to schedule an overdue appointment with Dr. Ole Holts for future refills. Thank you. 1st attempt. 30 capsule 0   dorzolamide-timolol (COSOPT) 22.3-6.8 MG/ML ophthalmic solution Place 1 drop into the right eye 2 (two) times daily.     doxycycline  (VIBRA -TABS) 100 MG tablet Take 1 tablet  (100 mg total) by mouth 2 (two) times daily. 14 tablet 0   DULoxetine  (CYMBALTA ) 60 MG capsule Take 1 capsule (60 mg total) by mouth daily. 90 capsule 0   EPINEPHrine 0.3 mg/0.3 mL IJ SOAJ injection Inject 0.3 mg into the muscle as needed for anaphylaxis.     esomeprazole  (NEXIUM ) 40 MG capsule TAKE ONE CAPSULE BY MOUTH EVERY MORNING BEFORE BREAKFAST 90 capsule 1   insulin glargine, 2 Unit Dial, (TOUJEO MAX SOLOSTAR) 300 UNIT/ML Solostar Pen Inject 20 Units into the skin daily. 6 mL 0   JARDIANCE  25 MG TABS tablet TAKE 1 TABLET BY MOUTH DAILY 90 tablet 1   NONFORMULARY OR COMPOUNDED ITEM Estradiol  vaginal cream 0.02% insert 1 gram vaginally twice weekly.  Dispense 24 grams/3RF 1 each 3   rivaroxaban  (XARELTO ) 20 MG TABS tablet Take 1 tablet (20 mg total) by mouth daily with supper. 90 tablet 1   rosuvastatin  (CRESTOR ) 10 MG tablet TAKE 1 TABLET BY MOUTH DAILY 90 tablet 1   triamcinolone cream (KENALOG) 0.5 % Apply 1 Application topically 4 (four) times daily. 60 g 0   zolpidem  (AMBIEN ) 5 MG tablet Take 1 tablet (5 mg total) by mouth at bedtime as needed for sleep. 15 tablet 2   No current facility-administered medications for this visit.    Allergies:   Metformin  and related, Dust mite extract, and Mold extract [trichophyton mentagrophyte]   Social History:  The patient  reports that she quit smoking about 50 years ago. Her smoking use included cigarettes. She has a 1.3 pack-year smoking history. She has never used smokeless tobacco. She reports that she does not currently use alcohol. She reports that she does not use drugs.   Family History:  The patient's family history includes Cancer in her father, maternal grandmother, and son; Colon cancer (age of onset: 97) in her father; Diabetes in her paternal grandfather; Heart attack in her son; Heart disease in her mother; Hypertension in her father.  ROS:  Please see the history of present illness.    All other systems are reviewed and otherwise  negative.   PHYSICAL EXAM:  VS:  LMP 03/17/1991  BMI: There is no height or weight on file to calculate BMI. Well nourished, well developed, in no acute distress HEENT: normocephalic, atraumatic Neck: no JVD, carotid bruits or masses Cardiac: RRR; no significant murmurs, no rubs, or gallops Lungs: CTA b/l, no wheezing, rhonchi or rales Abd: soft, nontender MS: no deformity or atrophy Ext: no edema Skin: warm and dry, no rash Neuro:  No gross deficits appreciated Psych: euthymic mood, full affect   EKG:  not done today 06/16/23, personally reviewed SB 58bpm   04/21/21: TTE CONCLUSIONS: 1. Successful PVI 2. Successful ablation/isolation of the posterior wall 3. Intracardiac echo reveals trivial pericardial effusion and normal left atrial architecture 4. No early apparent complications. 5.  Colchicine  0.6 mg by mouth  twice daily for 5 days 6.  Continue home PPI   11/07/20: TTE  1. Left ventricular ejection fraction, by estimation, is 50 to 55%. The  left ventricle has low normal function. The left ventricle has no regional  wall motion abnormalities. Left ventricular diastolic function could not  be evaluated.   2. Right ventricular systolic function is normal. The right ventricular  size is normal. There is normal pulmonary artery systolic pressure.   3. The mitral valve is normal in structure. Mild mitral valve  regurgitation. No evidence of mitral stenosis.   4. The aortic valve is grossly normal. Aortic valve regurgitation is  mild. No aortic stenosis is present.    Recent Labs: 06/16/2023: TSH 0.71 12/27/2023: ALT 17; BUN 14; Creatinine, Ser 0.67; Hemoglobin 13.9; Platelets 266.0; Potassium 3.7; Sodium 138  06/16/2023: Cholesterol 135; HDL 54.70; LDL Cholesterol 53; Total CHOL/HDL Ratio 2; Triglycerides 133.0; VLDL 26.6   CrCl cannot be calculated (Patient's most recent lab result is older than the maximum 21 days allowed.).   Wt Readings from Last 3 Encounters:   01/05/24 183 lb 12.8 oz (83.4 kg)  12/30/23 180 lb (81.6 kg)  12/29/23 180 lb (81.6 kg)     Other studies reviewed: Additional studies/records reviewed today include: summarized above  ASSESSMENT AND PLAN:  Persistent Afib CHA2DS2Vasc is 4, on Xarelto , appropriately dosed No/low burden post ablation   HTN Followed by her PMD  Secondary hypercoagulable state  Disposition: discussed Dr. Hiram upcoming relocation and perhaps discharging from EP service, to c/w her PMD for labs/Xarelto , though she would like to keep active with our service, will have her meet Dr. Kennyth in a year, sooner if needed  Current medicines are reviewed at length with the patient today.  The patient did not have any concerns regarding medicines.  Bonney Charlies Arthur, PA-C 01/20/2024 12:18 PM     [facility] [address] [place] [address] [phone] (office)  [phone] (fax)

## 2024-01-22 ENCOUNTER — Other Ambulatory Visit: Payer: Self-pay | Admitting: Cardiology

## 2024-01-22 ENCOUNTER — Other Ambulatory Visit: Payer: Self-pay | Admitting: Internal Medicine

## 2024-01-22 DIAGNOSIS — K2100 Gastro-esophageal reflux disease with esophagitis, without bleeding: Secondary | ICD-10-CM

## 2024-01-25 ENCOUNTER — Ambulatory Visit: Attending: Physician Assistant | Admitting: Physician Assistant

## 2024-01-25 VITALS — BP 145/73 | HR 68 | Ht 65.0 in | Wt 183.0 lb

## 2024-01-25 DIAGNOSIS — I10 Essential (primary) hypertension: Secondary | ICD-10-CM

## 2024-01-25 DIAGNOSIS — D6869 Other thrombophilia: Secondary | ICD-10-CM

## 2024-01-25 DIAGNOSIS — I4819 Other persistent atrial fibrillation: Secondary | ICD-10-CM | POA: Diagnosis not present

## 2024-01-25 NOTE — Patient Instructions (Signed)
 Medication Instructions:   Your physician recommends that you continue on your current medications as directed. Please refer to the Current Medication list given to you today.  *If you need a refill on your cardiac medications before your next appointment, please call your pharmacy*  Lab Work: NONE ORDERED  TODAY    If you have labs (blood work) drawn today and your tests are completely normal, you will receive your results only by: MyChart Message (if you have MyChart) OR A paper copy in the mail If you have any lab test that is abnormal or we need to change your treatment, we will call you to review the results.  Testing/Procedures: NONE ORDERED  TODAY   Follow-Up: At [facility], you and your health needs are our priority.  As part of our continuing mission to provide you with exceptional heart care, our providers are all part of one team.  This team includes your primary Cardiologist (physician) and Advanced Practice Providers or APPs (Physician Assistants and Nurse Practitioners) who all work together to provide you with the care you need, when you need it.  Your next appointment:   1 year(s)  Provider:   You may see Dr. Kennyth   We recommend signing up for the patient portal called MyChart.  Sign up information is provided on this After Visit Summary.  MyChart is used to connect with patients for Virtual Visits (Telemedicine).  Patients are able to view lab/test results, encounter notes, upcoming appointments, etc.  Non-urgent messages can be sent to your provider as well.   To learn more about what you can do with MyChart, go to [email or web address].   Other Instructions

## 2024-02-04 ENCOUNTER — Other Ambulatory Visit: Payer: Self-pay | Admitting: Cardiology

## 2024-03-01 LAB — MICROALBUMIN / CREATININE URINE RATIO: Microalb Creat Ratio: 5

## 2024-03-01 LAB — PROTEIN / CREATININE RATIO, URINE: Creatinine, Urine: 5

## 2024-03-14 ENCOUNTER — Encounter: Payer: Self-pay | Admitting: Internal Medicine

## 2024-03-17 ENCOUNTER — Ambulatory Visit

## 2024-03-17 VITALS — BP 124/72 | HR 66 | Ht 65.0 in | Wt 182.0 lb

## 2024-03-17 DIAGNOSIS — Z0000 Encounter for general adult medical examination without abnormal findings: Secondary | ICD-10-CM | POA: Diagnosis not present

## 2024-03-17 NOTE — Patient Instructions (Addendum)
 [name],  Thank you for taking the time for your Medicare Wellness Visit. I appreciate your continued commitment to your health goals. Please review the care plan we discussed, and feel free to reach out if I can assist you further.  Please note that Annual Wellness Visits do not include a physical exam. Some assessments may be limited, especially if the visit was conducted virtually. If needed, we may recommend an in-person follow-up with your provider.  Ongoing Care Seeing your primary care provider every 3 to 6 months helps us  monitor your health and provide consistent, personalized care. Last office visit on 12/27/2023.  Remember to call the office or send a message if the pharmacy does not have your Freestyle [place] sensor ready and or the TOUJEO  MAX SOLOSTAR, (injection).  You are due for a kidney evaluation (urine sample) and will have that done during your next office visit.    Referrals If a referral was made during today's visit and you haven't received any updates within two weeks, please contact the referred provider directly to check on the status.  Recommended Screenings:  Health Maintenance  Topic Date Due   Yearly kidney health urinalysis for diabetes  Never done   DTaP/Tdap/Td vaccine (2 - Td or Tdap) 10/07/2020   COVID-19 Vaccine (7 - Pfizer risk 2025-26 season) 06/01/2024   Complete foot exam   06/15/2024   Hemoglobin A1C  06/26/2024   Medicare Annual Wellness Visit  10/31/2024   Breast Cancer Screening  12/05/2024   Yearly kidney function blood test for diabetes  12/26/2024   Eye exam for diabetics  01/18/2025   Pneumococcal Vaccine for age over 105  Completed   Flu Shot  Completed   Osteoporosis screening with Bone Density Scan  Completed   Hepatitis C Screening  Completed   Zoster (Shingles) Vaccine  Completed   Meningitis B Vaccine  Aged Out   Colon Cancer Screening  Discontinued       03/14/2024    4:49 PM  Advanced Directives  Does Patient Have a  Medical Advance Directive? Yes  Type of Estate Agent of [place];Living will  Copy of Healthcare Power of Attorney in Chart? No - copy requested    Vision: Annual vision screenings are recommended for early detection of glaucoma, cataracts, and diabetic retinopathy. These exams can also reveal signs of chronic conditions such as diabetes and high blood pressure.  Dental: Annual dental screenings help detect early signs of oral cancer, gum disease, and other conditions linked to overall health, including heart disease and diabetes.  Please see the attached documents for additional preventive care recommendations.

## 2024-03-17 NOTE — Progress Notes (Signed)
 "  Chief Complaint  Patient presents with   Medicare Wellness     Subjective:   Diana Reeves is a 74 y.o. female who presents for a Medicare Annual Wellness Visit.  Visit info / Clinical Intake: Medicare Wellness Visit Type:: Subsequent Annual Wellness Visit Persons participating in visit and providing information:: patient Medicare Wellness Visit Mode:: In-person (required for WTM) Interpreter Needed?: No Pre-visit prep was completed: yes AWV questionnaire completed by patient prior to visit?: yes Date:: 03/14/24 Living arrangements:: (!) (Patient-Rptd) lives alone Patient's Overall Health Status Rating: (Patient-Rptd) very good Typical amount of pain: (Patient-Rptd) none Does pain affect daily life?: (Patient-Rptd) no Are you currently prescribed opioids?: no  Dietary Habits and Nutritional Risks How many meals a day?: (Patient-Rptd) 2 Eats fruit and vegetables daily?: (!) (Patient-Rptd) no Most meals are obtained by: (Patient-Rptd) preparing own meals; eating out In the last 2 weeks, have you had any of the following?: none Diabetic:: (!) yes Any non-healing wounds?: no How often do you check your BS?: continuous glucose monitor (had a RX For [facility] was sent in during last OV with provider.  Pt has not started using it.) Would you like to be referred to a Nutritionist or for Diabetic Management? : no  Functional Status Activities of Daily Living (to include ambulation/medication): (Patient-Rptd) Independent Ambulation: (Patient-Rptd) Independent Home Assistive Devices/Equipment: Eyeglasses Medication Administration: (Patient-Rptd) Independent Home Management (perform basic housework or laundry): (Patient-Rptd) Independent Manage your own finances?: (Patient-Rptd) yes Primary transportation is: (Patient-Rptd) driving Concerns about vision?: no *vision screening is required for WTM* Concerns about hearing?: no  Fall Screening Falls in the past year?:  (Patient-Rptd) 0 Number of falls in past year: 0 Was there an injury with Fall?: 0 Fall Risk Category Calculator: 0 Patient Fall Risk Level: Low Fall Risk  Fall Risk Patient at Risk for Falls Due to: No Fall Risks Fall risk Follow up: Falls evaluation completed; Falls prevention discussed  Home and Transportation Safety: All rugs have non-skid backing?: (Patient-Rptd) yes All stairs or steps have railings?: (Patient-Rptd) yes Grab bars in the bathtub or shower?: (Patient-Rptd) yes Have non-skid surface in bathtub or shower?: (!) (Patient-Rptd) no Good home lighting?: (Patient-Rptd) yes Regular seat belt use?: (Patient-Rptd) yes Hospital stays in the last year:: (Patient-Rptd) no  Cognitive Assessment Difficulty concentrating, remembering, or making decisions? : (Patient-Rptd) no Will 6CIT or Mini Cog be Completed: no 6CIT or Mini Cog Declined: patient alert, oriented, able to answer questions appropriately and recall recent events  Advance Directives (For Healthcare) Does Patient Have a Medical Advance Directive?: Yes Type of Advance Directive: Healthcare Power of [place]; Living will Copy of Healthcare Power of Attorney in Chart?: No - copy requested Copy of Living Will in Chart?: No - copy requested  Reviewed/Updated  Reviewed/Updated: Reviewed All (Medical, Surgical, Family, Medications, Allergies, Care Teams, Patient Goals)    Allergies (verified) Metformin  and related, Dust mite extract, and Mold extract [trichophyton mentagrophyte]   Current Medications (verified) Outpatient Encounter Medications as of 03/17/2024  Medication Sig   Azelastine -Fluticasone  137-50 MCG/ACT SUSP Place 1 spray into both nostrils daily as needed (allergies).   clonazePAM  (KLONOPIN ) 0.5 MG tablet Take 1 tablet (0.5 mg total) by mouth 2 (two) times daily as needed for anxiety.   Continuous Glucose Sensor (FREESTYLE LIBRE 3 PLUS SENSOR) MISC Apply 1 Act topically every 14 (fourteen) days. Change  sensor every 15 days.   diltiazem  (CARDIZEM  CD) 180 MG 24 hr capsule TAKE 1 CAPSULE BY MOUTH DAILY   dorzolamide-timolol (  COSOPT) 22.3-6.8 MG/ML ophthalmic solution Place 1 drop into the right eye 2 (two) times daily.   DULoxetine  (CYMBALTA ) 60 MG capsule Take 1 capsule (60 mg total) by mouth daily.   EPINEPHrine 0.3 mg/0.3 mL IJ SOAJ injection Inject 0.3 mg into the muscle as needed for anaphylaxis.   esomeprazole  (NEXIUM ) 40 MG capsule TAKE 1 CAPSULE BY MOUTH EVERY MORNING BEFORE BREAKFAST   insulin glargine , 2 Unit Dial, (TOUJEO  MAX SOLOSTAR) 300 UNIT/ML Solostar Pen Inject 20 Units into the skin daily.   JARDIANCE  25 MG TABS tablet TAKE 1 TABLET BY MOUTH DAILY   NONFORMULARY OR COMPOUNDED ITEM Estradiol  vaginal cream 0.02% insert 1 gram vaginally twice weekly.  Dispense 24 grams/3RF   rivaroxaban  (XARELTO ) 20 MG TABS tablet Take 1 tablet (20 mg total) by mouth daily with supper.   rosuvastatin  (CRESTOR ) 10 MG tablet TAKE 1 TABLET BY MOUTH DAILY   zolpidem  (AMBIEN ) 5 MG tablet Take 1 tablet (5 mg total) by mouth at bedtime as needed for sleep.   doxycycline  (VIBRA -TABS) 100 MG tablet Take 1 tablet (100 mg total) by mouth 2 (two) times daily.   triamcinolone  cream (KENALOG ) 0.5 % Apply 1 Application topically 4 (four) times daily.   No facility-administered encounter medications on file as of 03/17/2024.    History: Past Medical History:  Diagnosis Date   A-fib (HCC)    Allergic state 12/26/2016   Allergy 1980   Anxiety 2012   Arthritis    Basal cell carcinoma    Benign neoplasm of colon    Degenerative cervical disc    Depression    Diabetes mellitus without complication (HCC) ?   Diverticulosis of colon (without mention of hemorrhage)    Esophageal reflux    Glaucoma    Osteoporosis, unspecified 12/2018   2007 T score -3.0, 2012 T score -1.9, 2015 T score -2.1 overall stable, 2018 T score -2.0, 2020 T score -1.8   Other chronic nonalcoholic liver disease    Sinusitis  12/26/2016   Stricture and stenosis of esophagus    Past Surgical History:  Procedure Laterality Date   ABDOMINAL HYSTERECTOMY  1993   TAH-LSO-Post. repair-[place]   ATRIAL FIBRILLATION ABLATION N/A 04/21/2021   Procedure: ATRIAL FIBRILLATION ABLATION;  Surgeon: Cindie Ole DASEN, MD;  Location: [facility] INVASIVE CV LAB;  Service: Cardiovascular;  Laterality: N/A;   CARDIOVERSION N/A 11/28/2020   Procedure: CARDIOVERSION;  Surgeon: Jeffrie Oneil BROCKS, MD;  Location: [facility] ENDOSCOPY;  Service: Cardiovascular;  Laterality: N/A;   CARDIOVERSION N/A 01/27/2021   Procedure: CARDIOVERSION;  Surgeon: Okey Vina GAILS, MD;  Location: [facility] ENDOSCOPY;  Service: Cardiovascular;  Laterality: N/A;   COLONOSCOPY     EYE SURGERY  10/18/2012   lt eye   FRACTURE SURGERY     knee arthroscop Right 2018   left arm surgery  09/2007   POLYPECTOMY     TUBAL LIGATION  1985   Family History  Problem Relation Age of Onset   Heart disease Mother    Cancer Father        colon   Hypertension Father    Colon cancer Father 92   Cancer Maternal Grandmother        uterine   Diabetes Paternal Grandfather    Cancer Son        Testicular cancer   Heart attack Son    Stomach cancer Neg Hx    Rectal cancer Neg Hx    Social History   Occupational History   Occupation: retired  Tobacco Use   Smoking status: Former    Current packs/day: 0.00    Average packs/day: 0.3 packs/day for 5.0 years (1.3 ttl pk-yrs)    Types: Cigarettes    Quit date: 07/14/1973    Years since quitting: 50.7   Smokeless tobacco: Never   Tobacco comments:    In college- 50 yeas ago-  Vaping Use   Vaping status: Never Used  Substance and Sexual Activity   Alcohol use: Not Currently    Comment: socially   Drug use: No   Sexual activity: Not Currently    Birth control/protection: Post-menopausal, Surgical    Comment: 1st intercourse 74 yo,,more than 5 partner,, hysterectomy,,no std hx,,DES negative   Tobacco Counseling Counseling given: Not  Answered Tobacco comments: In college- 50 yeas ago-  SDOH Screenings   Food Insecurity: No Food Insecurity (03/17/2024)  Housing: Unknown (03/17/2024)  Transportation Needs: No Transportation Needs (03/17/2024)  Utilities: Not At Risk (03/17/2024)  Alcohol Screen: Low Risk (12/27/2023)  Depression (PHQ2-9): Low Risk (03/17/2024)  Financial Resource Strain: Low Risk (12/27/2023)  Physical Activity: Insufficiently Active (03/17/2024)  Social Connections: Moderately Integrated (03/17/2024)  Stress: No Stress Concern Present (03/17/2024)  Tobacco Use: Medium Risk (03/17/2024)  Health Literacy: Adequate Health Literacy (11/01/2023)   See flowsheets for full screening details  Depression Screen PHQ 2 & 9 Depression Scale- Over the past 2 weeks, how often have you been bothered by any of the following problems? Little interest or pleasure in doing things: 0 Feeling down, depressed, or hopeless (PHQ Adolescent also includes...irritable): 0 PHQ-2 Total Score: 0 Trouble falling or staying asleep, or sleeping too much: 0 Feeling tired or having little energy: 0 Poor appetite or overeating (PHQ Adolescent also includes...weight loss): 0 Feeling bad about yourself - or that you are a failure or have let yourself or your family down: 0 Trouble concentrating on things, such as reading the newspaper or watching television (PHQ Adolescent also includes...like school work): 0 Moving or speaking so slowly that other people could have noticed. Or the opposite - being so fidgety or restless that you have been moving around a lot more than usual: 0 Thoughts that you would be better off dead, or of hurting yourself in some way: 0 PHQ-9 Total Score: 0 If you checked off any problems, how difficult have these problems made it for you to do your work, take care of things at home, or get along with other people?: Not difficult at all  Depression Treatment Depression Interventions/Treatment : EYV7-0 Score <4 Follow-up Not  Indicated     Goals Addressed             This Visit's Progress    Patient Stated   On track    I want continue to be healthy and maintain my weight.              Objective:    Today's Vitals   03/17/24 1354  BP: 124/72  Pulse: 66  SpO2: 97%  Weight: 182 lb (82.6 kg)  Height: 5' 5 (1.651 m)   Body mass index is 30.29 kg/m.  Hearing/Vision screen Hearing Screening - Comments:: Denies hearing difficulties   Vision Screening - Comments:: UTD/Dr. Mccuen Immunizations and Health Maintenance Health Maintenance  Topic Date Due   Diabetic kidney evaluation - Urine ACR  Never done   COVID-19 Vaccine (7 - Pfizer risk 2025-26 season) 06/01/2024   FOOT EXAM  06/15/2024   HEMOGLOBIN A1C  06/26/2024   Mammogram  12/05/2024  Diabetic kidney evaluation - eGFR measurement  12/26/2024   OPHTHALMOLOGY EXAM  01/18/2025   Medicare Annual Wellness (AWV)  03/17/2025   DTaP/Tdap/Td (3 - Td or Tdap) 01/22/2033   Pneumococcal Vaccine: 50+ Years  Completed   Influenza Vaccine  Completed   Bone Density Scan  Completed   Hepatitis C Screening  Completed   Zoster Vaccines- Shingrix   Completed   Meningococcal B Vaccine  Aged Out   Colonoscopy  Discontinued        Assessment/Plan:  This is a routine wellness examination for Diana Reeves.  Patient Care Team: [name], MD as PCP - General (Internal Medicine) Francyne Headland, MD as PCP - Cardiology (Cardiology) Cindie Ole DASEN, MD as PCP - Electrophysiology (Cardiology) Leslee Reusing, MD as Consulting Physician (Ophthalmology) Merceda Lela SAUNDERS, RPH-CPP (Pharmacist)  I have personally reviewed and noted the following in the patients chart:   Medical and social history Use of alcohol, tobacco or illicit drugs  Current medications and supplements including opioid prescriptions. Functional ability and status Nutritional status Physical activity Advanced directives List of other physicians Hospitalizations,  surgeries, and ER visits in previous 12 months Vitals Screenings to include cognitive, depression, and falls Referrals and appointments  No orders of the defined types were placed in this encounter.  In addition, I have reviewed and discussed with patient certain preventive protocols, quality metrics, and best practice recommendations. A written personalized care plan for preventive services as well as general preventive health recommendations were provided to patient.   Diana Reeves, CMA   03/17/2024   Return in 1 year (on 03/17/2025).  After Visit Summary: (MyChart) Due to this being a telephonic visit, the after visit summary with patients personalized plan was offered to patient via MyChart   Nurse Notes: Patient is due for a UACR and orders have been placed today.  She had concerns to address today. "

## 2024-04-07 LAB — LAB REPORT - SCANNED
Albumin, Urine POC: 0.8
Creatinine, POC: 116 mg/dL
EGFR: 87
Microalb Creat Ratio: 7

## 2024-04-08 ENCOUNTER — Other Ambulatory Visit: Payer: Self-pay | Admitting: Internal Medicine

## 2024-04-08 DIAGNOSIS — F325 Major depressive disorder, single episode, in full remission: Secondary | ICD-10-CM

## 2024-06-19 ENCOUNTER — Encounter: Admitting: Internal Medicine
